# Patient Record
Sex: Male | Born: 1935 | Race: White | Hispanic: No | Smoking: Current some day smoker
Health system: Southern US, Community
[De-identification: ages and names within clinical notes are randomized; demographics above are authoritative.]

## PROBLEM LIST (undated history)

## (undated) DIAGNOSIS — G4733 Obstructive sleep apnea (adult) (pediatric): Secondary | ICD-10-CM

## (undated) DIAGNOSIS — K219 Gastro-esophageal reflux disease without esophagitis: Secondary | ICD-10-CM

## (undated) DIAGNOSIS — I2510 Atherosclerotic heart disease of native coronary artery without angina pectoris: Secondary | ICD-10-CM

## (undated) DIAGNOSIS — Z950 Presence of cardiac pacemaker: Secondary | ICD-10-CM

## (undated) DIAGNOSIS — I4821 Permanent atrial fibrillation: Secondary | ICD-10-CM

## (undated) DIAGNOSIS — E785 Hyperlipidemia, unspecified: Secondary | ICD-10-CM

## (undated) DIAGNOSIS — J449 Chronic obstructive pulmonary disease, unspecified: Secondary | ICD-10-CM

## (undated) DIAGNOSIS — I272 Pulmonary hypertension, unspecified: Secondary | ICD-10-CM

## (undated) DIAGNOSIS — I509 Heart failure, unspecified: Secondary | ICD-10-CM

## (undated) DIAGNOSIS — R079 Chest pain, unspecified: Secondary | ICD-10-CM

## (undated) HISTORY — DX: Obstructive sleep apnea (adult) (pediatric): G47.33

## (undated) HISTORY — PX: PROSTATE SURGERY: SHX751

## (undated) HISTORY — DX: Chest pain, unspecified: R07.9

## (undated) HISTORY — DX: Pulmonary hypertension, unspecified: I27.20

## (undated) HISTORY — PX: HERNIA REPAIR: SHX51

## (undated) HISTORY — DX: Hyperlipidemia, unspecified: E78.5

## (undated) HISTORY — DX: Permanent atrial fibrillation: I48.21

---

## 1997-06-06 ENCOUNTER — Ambulatory Visit (HOSPITAL_COMMUNITY): Admission: RE | Admit: 1997-06-06 | Discharge: 1997-06-06 | Payer: Self-pay | Admitting: Psychiatry

## 1997-06-19 ENCOUNTER — Encounter: Admission: RE | Admit: 1997-06-19 | Discharge: 1997-06-19 | Payer: Self-pay | Admitting: Hematology and Oncology

## 1997-08-14 ENCOUNTER — Encounter: Admission: RE | Admit: 1997-08-14 | Discharge: 1997-08-14 | Payer: Self-pay | Admitting: Family Medicine

## 1997-10-22 ENCOUNTER — Encounter: Admission: RE | Admit: 1997-10-22 | Discharge: 1997-10-22 | Payer: Self-pay | Admitting: Family Medicine

## 1997-11-03 ENCOUNTER — Encounter: Admission: RE | Admit: 1997-11-03 | Discharge: 1997-11-03 | Payer: Self-pay | Admitting: Family Medicine

## 1997-12-01 ENCOUNTER — Encounter: Admission: RE | Admit: 1997-12-01 | Discharge: 1997-12-01 | Payer: Self-pay | Admitting: Family Medicine

## 1998-04-08 ENCOUNTER — Encounter: Admission: RE | Admit: 1998-04-08 | Discharge: 1998-04-08 | Payer: Self-pay | Admitting: Family Medicine

## 1998-04-16 ENCOUNTER — Ambulatory Visit (HOSPITAL_COMMUNITY): Admission: RE | Admit: 1998-04-16 | Discharge: 1998-04-16 | Payer: Self-pay | Admitting: *Deleted

## 1998-04-22 ENCOUNTER — Encounter: Admission: RE | Admit: 1998-04-22 | Discharge: 1998-04-22 | Payer: Self-pay | Admitting: Family Medicine

## 1999-02-05 ENCOUNTER — Encounter: Admission: RE | Admit: 1999-02-05 | Discharge: 1999-02-05 | Payer: Self-pay | Admitting: Family Medicine

## 1999-12-03 ENCOUNTER — Encounter: Admission: RE | Admit: 1999-12-03 | Discharge: 1999-12-03 | Payer: Self-pay | Admitting: Family Medicine

## 2000-01-18 ENCOUNTER — Encounter: Admission: RE | Admit: 2000-01-18 | Discharge: 2000-01-18 | Payer: Self-pay | Admitting: *Deleted

## 2000-01-20 ENCOUNTER — Encounter: Admission: RE | Admit: 2000-01-20 | Discharge: 2000-01-20 | Payer: Self-pay | Admitting: Family Medicine

## 2000-03-23 ENCOUNTER — Encounter: Admission: RE | Admit: 2000-03-23 | Discharge: 2000-03-23 | Payer: Self-pay | Admitting: Family Medicine

## 2000-04-12 ENCOUNTER — Encounter: Admission: RE | Admit: 2000-04-12 | Discharge: 2000-04-12 | Payer: Self-pay | Admitting: Family Medicine

## 2000-05-27 ENCOUNTER — Emergency Department (HOSPITAL_COMMUNITY): Admission: EM | Admit: 2000-05-27 | Discharge: 2000-05-27 | Payer: Self-pay | Admitting: Emergency Medicine

## 2000-06-28 ENCOUNTER — Encounter: Admission: RE | Admit: 2000-06-28 | Discharge: 2000-06-28 | Payer: Self-pay | Admitting: Family Medicine

## 2000-12-13 ENCOUNTER — Encounter: Admission: RE | Admit: 2000-12-13 | Discharge: 2000-12-13 | Payer: Self-pay | Admitting: Family Medicine

## 2001-01-10 ENCOUNTER — Encounter: Admission: RE | Admit: 2001-01-10 | Discharge: 2001-01-10 | Payer: Self-pay | Admitting: Family Medicine

## 2001-03-01 ENCOUNTER — Encounter: Admission: RE | Admit: 2001-03-01 | Discharge: 2001-03-01 | Payer: Self-pay | Admitting: Family Medicine

## 2001-03-06 ENCOUNTER — Encounter: Admission: RE | Admit: 2001-03-06 | Discharge: 2001-03-06 | Payer: Self-pay | Admitting: Family Medicine

## 2001-03-06 ENCOUNTER — Ambulatory Visit (HOSPITAL_COMMUNITY): Admission: RE | Admit: 2001-03-06 | Discharge: 2001-03-06 | Payer: Self-pay | Admitting: Family Medicine

## 2001-03-23 ENCOUNTER — Encounter: Admission: RE | Admit: 2001-03-23 | Discharge: 2001-03-23 | Payer: Self-pay | Admitting: Family Medicine

## 2001-04-06 ENCOUNTER — Encounter: Admission: RE | Admit: 2001-04-06 | Discharge: 2001-04-06 | Payer: Self-pay | Admitting: Family Medicine

## 2001-08-15 ENCOUNTER — Encounter: Admission: RE | Admit: 2001-08-15 | Discharge: 2001-08-15 | Payer: Self-pay | Admitting: Family Medicine

## 2001-08-23 ENCOUNTER — Encounter: Payer: Self-pay | Admitting: Sports Medicine

## 2001-08-23 ENCOUNTER — Encounter: Admission: RE | Admit: 2001-08-23 | Discharge: 2001-08-23 | Payer: Self-pay | Admitting: Sports Medicine

## 2001-08-29 ENCOUNTER — Encounter: Admission: RE | Admit: 2001-08-29 | Discharge: 2001-08-29 | Payer: Self-pay | Admitting: Family Medicine

## 2001-08-31 ENCOUNTER — Encounter: Payer: Self-pay | Admitting: Sports Medicine

## 2001-08-31 ENCOUNTER — Encounter: Admission: RE | Admit: 2001-08-31 | Discharge: 2001-08-31 | Payer: Self-pay | Admitting: Family Medicine

## 2001-08-31 ENCOUNTER — Encounter: Admission: RE | Admit: 2001-08-31 | Discharge: 2001-08-31 | Payer: Self-pay | Admitting: Sports Medicine

## 2001-09-07 ENCOUNTER — Encounter: Admission: RE | Admit: 2001-09-07 | Discharge: 2001-09-07 | Payer: Self-pay | Admitting: Family Medicine

## 2001-09-19 ENCOUNTER — Encounter: Payer: Self-pay | Admitting: Cardiology

## 2001-09-19 ENCOUNTER — Ambulatory Visit (HOSPITAL_COMMUNITY): Admission: RE | Admit: 2001-09-19 | Discharge: 2001-09-19 | Payer: Self-pay | Admitting: *Deleted

## 2001-09-21 ENCOUNTER — Encounter: Admission: RE | Admit: 2001-09-21 | Discharge: 2001-09-21 | Payer: Self-pay | Admitting: Family Medicine

## 2001-09-24 ENCOUNTER — Encounter: Admission: RE | Admit: 2001-09-24 | Discharge: 2001-09-24 | Payer: Self-pay | Admitting: Family Medicine

## 2001-10-03 ENCOUNTER — Encounter: Admission: RE | Admit: 2001-10-03 | Discharge: 2001-10-03 | Payer: Self-pay | Admitting: Family Medicine

## 2001-10-26 ENCOUNTER — Encounter: Admission: RE | Admit: 2001-10-26 | Discharge: 2001-10-26 | Payer: Self-pay | Admitting: Family Medicine

## 2001-11-06 ENCOUNTER — Encounter: Admission: RE | Admit: 2001-11-06 | Discharge: 2001-11-06 | Payer: Self-pay | Admitting: Family Medicine

## 2002-01-23 ENCOUNTER — Encounter: Admission: RE | Admit: 2002-01-23 | Discharge: 2002-01-23 | Payer: Self-pay | Admitting: Family Medicine

## 2002-02-18 ENCOUNTER — Encounter: Admission: RE | Admit: 2002-02-18 | Discharge: 2002-02-18 | Payer: Self-pay | Admitting: Family Medicine

## 2002-04-15 ENCOUNTER — Encounter: Admission: RE | Admit: 2002-04-15 | Discharge: 2002-04-15 | Payer: Self-pay | Admitting: Family Medicine

## 2002-05-08 ENCOUNTER — Encounter: Admission: RE | Admit: 2002-05-08 | Discharge: 2002-05-08 | Payer: Self-pay | Admitting: Family Medicine

## 2002-05-09 ENCOUNTER — Ambulatory Visit (HOSPITAL_COMMUNITY): Admission: RE | Admit: 2002-05-09 | Discharge: 2002-05-09 | Payer: Self-pay | Admitting: Sports Medicine

## 2002-05-15 ENCOUNTER — Encounter: Admission: RE | Admit: 2002-05-15 | Discharge: 2002-05-15 | Payer: Self-pay | Admitting: Family Medicine

## 2002-08-22 ENCOUNTER — Encounter: Admission: RE | Admit: 2002-08-22 | Discharge: 2002-08-22 | Payer: Self-pay | Admitting: Sports Medicine

## 2002-12-09 ENCOUNTER — Ambulatory Visit (HOSPITAL_BASED_OUTPATIENT_CLINIC_OR_DEPARTMENT_OTHER): Admission: RE | Admit: 2002-12-09 | Discharge: 2002-12-09 | Payer: Self-pay | Admitting: Family Medicine

## 2004-11-04 ENCOUNTER — Ambulatory Visit (HOSPITAL_COMMUNITY): Admission: RE | Admit: 2004-11-04 | Discharge: 2004-11-04 | Payer: Self-pay | Admitting: Family Medicine

## 2005-02-20 ENCOUNTER — Emergency Department (HOSPITAL_COMMUNITY): Admission: EM | Admit: 2005-02-20 | Discharge: 2005-02-20 | Payer: Self-pay | Admitting: Emergency Medicine

## 2005-12-08 ENCOUNTER — Ambulatory Visit (HOSPITAL_COMMUNITY): Admission: RE | Admit: 2005-12-08 | Discharge: 2005-12-08 | Payer: Self-pay | Admitting: Cardiovascular Disease

## 2006-04-13 DIAGNOSIS — I10 Essential (primary) hypertension: Secondary | ICD-10-CM | POA: Insufficient documentation

## 2006-04-13 DIAGNOSIS — F17200 Nicotine dependence, unspecified, uncomplicated: Secondary | ICD-10-CM | POA: Insufficient documentation

## 2006-04-13 DIAGNOSIS — M1990 Unspecified osteoarthritis, unspecified site: Secondary | ICD-10-CM

## 2006-04-13 DIAGNOSIS — F411 Generalized anxiety disorder: Secondary | ICD-10-CM | POA: Insufficient documentation

## 2006-04-13 DIAGNOSIS — E669 Obesity, unspecified: Secondary | ICD-10-CM | POA: Insufficient documentation

## 2006-04-13 DIAGNOSIS — J4489 Other specified chronic obstructive pulmonary disease: Secondary | ICD-10-CM | POA: Insufficient documentation

## 2006-04-13 DIAGNOSIS — K279 Peptic ulcer, site unspecified, unspecified as acute or chronic, without hemorrhage or perforation: Secondary | ICD-10-CM | POA: Insufficient documentation

## 2006-04-13 DIAGNOSIS — L219 Seborrheic dermatitis, unspecified: Secondary | ICD-10-CM

## 2006-04-13 DIAGNOSIS — N400 Enlarged prostate without lower urinary tract symptoms: Secondary | ICD-10-CM

## 2006-04-13 DIAGNOSIS — J449 Chronic obstructive pulmonary disease, unspecified: Secondary | ICD-10-CM

## 2006-04-13 DIAGNOSIS — F339 Major depressive disorder, recurrent, unspecified: Secondary | ICD-10-CM

## 2006-04-13 DIAGNOSIS — G43909 Migraine, unspecified, not intractable, without status migrainosus: Secondary | ICD-10-CM | POA: Insufficient documentation

## 2006-10-19 ENCOUNTER — Emergency Department (HOSPITAL_COMMUNITY): Admission: EM | Admit: 2006-10-19 | Discharge: 2006-10-19 | Payer: Self-pay | Admitting: Emergency Medicine

## 2007-12-20 ENCOUNTER — Ambulatory Visit: Payer: Self-pay | Admitting: Surgery

## 2007-12-20 ENCOUNTER — Encounter (INDEPENDENT_AMBULATORY_CARE_PROVIDER_SITE_OTHER): Payer: Self-pay | Admitting: Surgery

## 2007-12-20 ENCOUNTER — Ambulatory Visit (HOSPITAL_COMMUNITY): Admission: RE | Admit: 2007-12-20 | Discharge: 2007-12-20 | Payer: Self-pay | Admitting: Surgery

## 2008-01-06 ENCOUNTER — Encounter: Admission: RE | Admit: 2008-01-06 | Discharge: 2008-01-06 | Payer: Self-pay | Admitting: Surgery

## 2008-04-11 ENCOUNTER — Encounter: Admission: RE | Admit: 2008-04-11 | Discharge: 2008-04-11 | Payer: Self-pay | Admitting: Family Medicine

## 2008-05-26 ENCOUNTER — Ambulatory Visit: Payer: Self-pay | Admitting: Internal Medicine

## 2008-05-26 ENCOUNTER — Encounter (INDEPENDENT_AMBULATORY_CARE_PROVIDER_SITE_OTHER): Payer: Self-pay | Admitting: Cardiology

## 2008-05-26 ENCOUNTER — Ambulatory Visit: Payer: Self-pay | Admitting: Pulmonary Disease

## 2008-05-26 ENCOUNTER — Inpatient Hospital Stay (HOSPITAL_COMMUNITY): Admission: EM | Admit: 2008-05-26 | Discharge: 2008-05-31 | Payer: Self-pay | Admitting: Emergency Medicine

## 2008-05-31 ENCOUNTER — Emergency Department (HOSPITAL_COMMUNITY): Admission: EM | Admit: 2008-05-31 | Discharge: 2008-05-31 | Payer: Self-pay | Admitting: Emergency Medicine

## 2008-06-10 DIAGNOSIS — I2510 Atherosclerotic heart disease of native coronary artery without angina pectoris: Secondary | ICD-10-CM

## 2008-06-10 DIAGNOSIS — I279 Pulmonary heart disease, unspecified: Secondary | ICD-10-CM | POA: Insufficient documentation

## 2008-06-10 DIAGNOSIS — N189 Chronic kidney disease, unspecified: Secondary | ICD-10-CM

## 2008-06-10 DIAGNOSIS — Z87898 Personal history of other specified conditions: Secondary | ICD-10-CM | POA: Insufficient documentation

## 2008-06-10 DIAGNOSIS — E7800 Pure hypercholesterolemia, unspecified: Secondary | ICD-10-CM | POA: Insufficient documentation

## 2008-06-12 ENCOUNTER — Ambulatory Visit: Payer: Self-pay | Admitting: Internal Medicine

## 2008-06-12 DIAGNOSIS — I4891 Unspecified atrial fibrillation: Secondary | ICD-10-CM | POA: Insufficient documentation

## 2008-06-13 ENCOUNTER — Telehealth: Payer: Self-pay | Admitting: Internal Medicine

## 2008-06-22 ENCOUNTER — Emergency Department (HOSPITAL_COMMUNITY): Admission: EM | Admit: 2008-06-22 | Discharge: 2008-06-23 | Payer: Self-pay | Admitting: Emergency Medicine

## 2008-06-25 ENCOUNTER — Ambulatory Visit: Payer: Self-pay | Admitting: Pulmonary Disease

## 2008-06-25 DIAGNOSIS — G4733 Obstructive sleep apnea (adult) (pediatric): Secondary | ICD-10-CM | POA: Insufficient documentation

## 2008-06-25 DIAGNOSIS — I10 Essential (primary) hypertension: Secondary | ICD-10-CM | POA: Insufficient documentation

## 2008-07-08 ENCOUNTER — Ambulatory Visit: Payer: Self-pay | Admitting: Vascular Surgery

## 2008-07-08 ENCOUNTER — Encounter (INDEPENDENT_AMBULATORY_CARE_PROVIDER_SITE_OTHER): Payer: Self-pay | Admitting: Emergency Medicine

## 2008-07-09 ENCOUNTER — Inpatient Hospital Stay (HOSPITAL_COMMUNITY): Admission: EM | Admit: 2008-07-09 | Discharge: 2008-07-14 | Payer: Self-pay | Admitting: Emergency Medicine

## 2008-07-09 ENCOUNTER — Encounter (INDEPENDENT_AMBULATORY_CARE_PROVIDER_SITE_OTHER): Payer: Self-pay | Admitting: Internal Medicine

## 2008-07-09 ENCOUNTER — Encounter (INDEPENDENT_AMBULATORY_CARE_PROVIDER_SITE_OTHER): Payer: Self-pay | Admitting: *Deleted

## 2008-07-29 ENCOUNTER — Ambulatory Visit: Payer: Self-pay | Admitting: Pulmonary Disease

## 2008-08-24 ENCOUNTER — Encounter: Payer: Self-pay | Admitting: Pulmonary Disease

## 2008-11-25 HISTORY — PX: NM MYOCAR PERF WALL MOTION: HXRAD629

## 2009-11-11 ENCOUNTER — Encounter: Payer: Self-pay | Admitting: Pulmonary Disease

## 2009-11-13 ENCOUNTER — Inpatient Hospital Stay (HOSPITAL_COMMUNITY): Admission: AD | Admit: 2009-11-13 | Discharge: 2009-11-23 | Payer: Self-pay

## 2009-11-16 HISTORY — PX: PERMANENT PACEMAKER INSERTION: SHX6023

## 2009-11-18 ENCOUNTER — Encounter (INDEPENDENT_AMBULATORY_CARE_PROVIDER_SITE_OTHER): Payer: Self-pay

## 2009-12-01 ENCOUNTER — Ambulatory Visit (HOSPITAL_COMMUNITY): Admission: RE | Admit: 2009-12-01 | Discharge: 2009-12-01 | Payer: Self-pay | Admitting: Urology

## 2009-12-02 ENCOUNTER — Ambulatory Visit: Payer: Self-pay | Admitting: Critical Care Medicine

## 2009-12-02 ENCOUNTER — Inpatient Hospital Stay (HOSPITAL_COMMUNITY): Admission: AD | Admit: 2009-12-02 | Discharge: 2009-12-08 | Payer: Self-pay | Admitting: Urology

## 2009-12-25 ENCOUNTER — Ambulatory Visit: Payer: Self-pay | Admitting: Pulmonary Disease

## 2009-12-25 LAB — CONVERTED CEMR LAB
CO2: 40 meq/L — ABNORMAL HIGH (ref 19–32)
Calcium: 9.2 mg/dL (ref 8.4–10.5)
Creatinine, Ser: 1 mg/dL (ref 0.4–1.5)
GFR calc non Af Amer: 75.38 mL/min (ref >60)
Glucose, Bld: 104 mg/dL — ABNORMAL HIGH (ref 70–99)
Sodium: 137 meq/L (ref 135–145)

## 2010-01-01 ENCOUNTER — Encounter: Payer: Self-pay | Admitting: Pulmonary Disease

## 2010-03-06 ENCOUNTER — Encounter: Payer: Self-pay | Admitting: Cardiovascular Disease

## 2010-03-07 ENCOUNTER — Encounter: Payer: Self-pay | Admitting: Surgery

## 2010-03-16 NOTE — Assessment & Plan Note (Signed)
Summary: hfu/ mbw   Visit Type:  Hospital Follow-up Copy to:  Dr. Hillis Range Primary Provider/Referring Provider:  Dr. Lesly Rubenstein  CC:  Pt here for hospital follow up.  History of Present Illness: Note that  his echart medical record # is [id number] 74/M , ex-smoker, for FU of severe COPD, CRI, cor pulmonale & obstructive sleep apnea.   adm 4/11 for hyperacrbic resp failure requiring BiPAP.  Tele >> atrial flutter, fibrillation, Dr Johney Frame felt that long term strategy should be AC & rate control with ablation only if symptomatic or decompensation. Echo showed dilated RA, EF 55 %. He developed rt epistaxis requiring cautery & stopped  CPAP . CT chest 04/11/08 >> RLL scarring  6/10 Quit smoking since 4/10. started using CPAP again. He lives by himself & takes his meds. c/o fatigue, HR has been running low, RN called Cards & medication was stopped -? diltiazem- he is not sure which one. Does not want to start pulm rehab. He cannot walk in the store & wants a motorised wheelchair. Spirometry >> mild airway obstruction. poor compliance on download 6/12- 08/24/08 on auto   December 25, 2009 4:38 PM  Traumatic admission s/p prostatectomy with bleeding, urethral trauma requiring suprapubuc cystostomy eventually. He is tearful today. We saw him during hosp admission, diuresed with improvement in breathing. Med rec today  shows -symbicort deleted, he has stopped coumadin due to fear of bleeding. Edema has resolved on lasix 20 two times a day . he has obtained liquid O2 from [employer] [phone].  Current Medications (verified): 1)  Diltiazem Hcl Er Beads 120 Mg Xr24h-Cap (Diltiazem Hcl Er Beads) .... Take One Capsule By Mouth Daily 2)  Crestor 20 Mg Tabs (Rosuvastatin Calcium) .Marland Kitchen.. 1 Once Daily 3)  Omeprazole 20 Mg Cpdr (Omeprazole) .Marland Kitchen.. 1 Once Daily 4)  Vitamin D 54098 Unit Caps (Ergocalciferol) .... Every Tues 5)  Lyrica 75 Mg Caps (Pregabalin) .Marland Kitchen.. 1 Two Times A Day 6)  Alprazolam 1 Mg Tabs  (Alprazolam) .Marland Kitchen.. 1 Three Times A Day 7)  Aspirin Adult Low Strength 81 Mg Tbec (Aspirin) .Marland Kitchen.. 1 Once Daily 8)  Oxycodone-Acetaminophen 10-325 Mg Tabs (Oxycodone-Acetaminophen) .Marland Kitchen.. 1 Every 6 Hours As Needed 9)  Nasonex 50 Mcg/act Susp (Mometasone Furoate) .... 2 Sprays Each Nostril Two Times A Day As Needed 10)  Oxygen 2 Liters .... 24/7 11)  Lidocaine Hcl 0.5 % Soln (Lidocaine Hcl (Local Anesth.)) .... Apply As Needed 12)  Tramadol Hcl 50 Mg Tabs (Tramadol Hcl) .Marland Kitchen.. 1 To 2 Every 6 Hours As Needed 13)  Lidoderm 5 % Ptch (Lidocaine) .... As Needed 14)  Furosemide 20 Mg Tabs (Furosemide) .... Take 1 Tablet By Mouth Two Times A Day 15)  Cpap .... At Bedtime 16)  Nitrostat 0.4 Mg Subl (Nitroglycerin) .... As Needed 17)  Artificial Tears  Soln (Artificial Tear Solution) .... As Needed 18)  Prednisone 10 Mg Tabs (Prednisone) .... Take 1 Tablet By Mouth Once A Day  Allergies (verified): No Known Drug Allergies  Past History:  Past Medical History: Last updated: 06/25/2008 carpel tunnel syndrome, Glossitis, h/o Heavy EtOH use, Refuses screening procedures, rotator cuff strain, trigger finger Left hand, 4th digit Emphysema Hyperlipidemia Hypertension  Social History: Last updated: 06/25/2008 Single, many financial difficulties; Estranged from son, closer with daughter and lives with her.; Smokes heavily, h/o heavy EtOH abuse.  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  Poor insight into need for behavior change, focuses on pain.  Pt states quit  smoking 05-25-08.  Smoked 2 ppd x 53 years.  Past Surgical History: echo 09/19/01: diastolic dysfxn - 09/21/2001 Pacemaker 11/2009 Prostatectomy 11/2009 Hernia repair-11/2009  Review of Systems       The patient complains of dyspnea on exertion.  The patient denies anorexia, fever, weight loss, weight gain, vision loss, decreased hearing, hoarseness, chest pain, syncope, peripheral edema, prolonged cough, headaches, hemoptysis,  abdominal pain, melena, hematochezia, severe indigestion/heartburn, hematuria, muscle weakness, suspicious skin lesions, difficulty walking, depression, unusual weight change, abnormal bleeding, enlarged lymph nodes, and angioedema.    Vital Signs:  Patient profile:   75 year old male Height:      68 inches Weight:      210 pounds BMI:     32.05 O2 Sat:      96 % on 2 L/min pulsed Temp:     97.5 degrees F oral Pulse rate:   62 / minute BP sitting:   98 / 60  (right arm) Cuff size:   large  Vitals Entered By: Zackery Barefoot CMA (December 25, 2009 4:19 PM)  O2 Flow:  2 L/min pulsed CC: Pt here for hospital follow up Comments Medications reviewed with patient Verified contact number and pharmacy with patient Zackery Barefoot CMA  December 25, 2009 4:20 PM    Physical Exam  Additional Exam:  wt 210 December 25, 2009  Gen. Pleasant, well-nourished, in no distress, depressed affect, on O2 ENT - no lesions, no post nasal drip Neck: No JVD, no thyromegaly, no carotid bruits Lungs: no use of accessory muscles, no dullness to percussion, clear without rales or rhonchi  Cardiovascular: Rhythm regular, heart sounds  normal, no murmurs or gallops, no peripheral edema Musculoskeletal: No deformities, no cyanosis or clubbing Neuro:  alert, non focal     Impression & Recommendations:  Problem # 1:  COPD (ICD-496) get back on symbicort 160/4.5 2 puffs two times a day  Albuterol MDI/ nebs as needed only  Problem # 2:  COR PULMONALE (ICD-416.9) decrease lasix to once daily  chk BMET Orders: Est. Patient Level IV (40981) Prescription Created Electronically (X9147) TLB-BMP (Basic Metabolic Panel-BMET) (80048-METABOL) DME Referral (DME)  Problem # 3:  ATRIAL FLUTTER (ICD-427.32) He has stopped coumadin for fear of bleeding. I have asked him to check with Dr Patsi Sears if he can restart. He appears to  be in nSR today . Dr Lynnea Ferrier to opine on whether he needs this lifelong. The  following medications were removed from the medication list:    Coumadin 5 Mg Tabs (Warfarin sodium) .Marland Kitchen... As directed His updated medication list for this problem includes:    Aspirin Adult Low Strength 81 Mg Tbec (Aspirin) .Marland Kitchen... 1 once daily  Problem # 4:  OBSTRUCTIVE SLEEP APNEA (ICD-780.57) Compliance encouraged, wt loss emphasized, asked to avoid meds with sedative side effects, cautioned against driving when sleepy.   Medications Added to Medication List This Visit: 1)  Furosemide 20 Mg Tabs (Furosemide) .... Take 1 tablet by mouth two times a day 2)  Nitrostat 0.4 Mg Subl (Nitroglycerin) .... As needed 3)  Artificial Tears Soln (Artificial tear solution) .... As needed 4)  Prednisone 10 Mg Tabs (Prednisone) .... Take 1 tablet by mouth once a day  Patient Instructions: 1)  Copy sent to: Dr Linward Headland, [place] 2)  Please schedule a follow-up appointment in 3-4 months. 3)  get back on symbicort 160/4.5 2 puffs two times a day  4)  Albuterol MDI/ nebs as needed only 5)  blood work today  6)  Meet our [facility] before you leave 7)  Get back on coumadin if OK with Dr Patsi Sears, your pulse is regular today 8)  decrease lasix to 20 mg once daily , if wt increases by 5 lbs or if swelling develops, you may have to increase again   Immunization History:  Influenza Immunization History:    Influenza:  historical (11/16/2009)  Pneumovax Immunization History:    Pneumovax:  historical (12/17/2007)   Appended Document: hfu/ mbw taper prednisone to 1/2 tab once daily x 7 days then off Potassium level ok, ok to take once daily   Appended Document: hfu/ mbw pt informed and verbalized understanding that labs. jwr

## 2010-03-18 NOTE — Miscellaneous (Signed)
Summary: POA  POA   Imported By: Sherian Rein 01/29/2010 11:34:52  _____________________________________________________________________  External Attachment:    Type:   Image     Comment:   External Document

## 2010-04-28 LAB — BASIC METABOLIC PANEL
BUN: 17 mg/dL (ref 6–23)
BUN: 18 mg/dL (ref 6–23)
CO2: 31 mEq/L (ref 19–32)
CO2: 33 mEq/L — ABNORMAL HIGH (ref 19–32)
CO2: 34 mEq/L — ABNORMAL HIGH (ref 19–32)
Chloride: 101 mEq/L (ref 96–112)
Chloride: 95 mEq/L — ABNORMAL LOW (ref 96–112)
Chloride: 99 mEq/L (ref 96–112)
Creatinine, Ser: 0.8 mg/dL (ref 0.4–1.5)
Creatinine, Ser: 1.15 mg/dL (ref 0.4–1.5)
GFR calc Af Amer: >60 mL/min (ref >60)
GFR calc Af Amer: >60 mL/min (ref >60)
Glucose, Bld: 138 mg/dL — ABNORMAL HIGH (ref 70–99)
Potassium: 5 mEq/L (ref 3.5–5.1)
Sodium: 134 mEq/L — ABNORMAL LOW (ref 135–145)

## 2010-04-28 LAB — CBC
HCT: 28.6 % — ABNORMAL LOW (ref 39.0–52.0)
HCT: 30.8 % — ABNORMAL LOW (ref 39.0–52.0)
Hemoglobin: 10 g/dL — ABNORMAL LOW (ref 13.0–17.0)
Hemoglobin: 11.7 g/dL — ABNORMAL LOW (ref 13.0–17.0)
MCH: 30.1 pg (ref 26.0–34.0)
MCH: 30.1 pg (ref 26.0–34.0)
MCH: 30.1 pg (ref 26.0–34.0)
MCH: 30.1 pg (ref 26.0–34.0)
MCHC: 33.6 g/dL (ref 30.0–36.0)
MCHC: 34.3 g/dL (ref 30.0–36.0)
MCV: 88.2 fL (ref 78.0–100.0)
MCV: 89.1 fL (ref 78.0–100.0)
MCV: 89.3 fL (ref 78.0–100.0)
MCV: 89.5 fL (ref 78.0–100.0)
Platelets: 264 10*3/uL (ref 150–400)
Platelets: 300 10*3/uL (ref 150–400)
Platelets: 334 10*3/uL (ref 150–400)
Platelets: 360 10*3/uL (ref 150–400)
RBC: 3.17 MIL/uL — ABNORMAL LOW (ref 4.22–5.81)
RBC: 3.25 MIL/uL — ABNORMAL LOW (ref 4.22–5.81)
RBC: 3.31 MIL/uL — ABNORMAL LOW (ref 4.22–5.81)
RDW: 15.7 % — ABNORMAL HIGH (ref 11.5–15.5)
WBC: 12.2 10*3/uL — ABNORMAL HIGH (ref 4.0–10.5)

## 2010-04-28 LAB — COMPREHENSIVE METABOLIC PANEL
AST: 14 U/L (ref 0–37)
Albumin: 2.1 g/dL — ABNORMAL LOW (ref 3.5–5.2)
Alkaline Phosphatase: 55 U/L (ref 39–117)
Chloride: 97 mEq/L (ref 96–112)
GFR calc Af Amer: >60 mL/min (ref >60)
Potassium: 4 mEq/L (ref 3.5–5.1)
Total Bilirubin: 1.3 mg/dL — ABNORMAL HIGH (ref 0.3–1.2)

## 2010-04-28 LAB — PROTIME-INR
INR: 1.76 — ABNORMAL HIGH (ref 0.00–1.49)
Prothrombin Time: 20.7 seconds — ABNORMAL HIGH (ref 11.6–15.2)

## 2010-04-28 LAB — URINE CULTURE: Special Requests: NEGATIVE

## 2010-04-28 LAB — EXPECTORATED SPUTUM ASSESSMENT W GRAM STAIN, RFLX TO RESP C

## 2010-04-28 LAB — CARDIAC PANEL(CRET KIN+CKTOT+MB+TROPI): Relative Index: INVALID (ref 0.0–2.5)

## 2010-04-29 LAB — CBC
HCT: 34.5 % — ABNORMAL LOW (ref 39.0–52.0)
HCT: 45.1 % (ref 39.0–52.0)
HCT: 45.3 % (ref 39.0–52.0)
Hemoglobin: 14 g/dL (ref 13.0–17.0)
Hemoglobin: 14.8 g/dL (ref 13.0–17.0)
Hemoglobin: 15 g/dL (ref 13.0–17.0)
MCH: 29.4 pg (ref 26.0–34.0)
MCH: 29.8 pg (ref 26.0–34.0)
MCH: 30.1 pg (ref 26.0–34.0)
MCHC: 32.8 g/dL (ref 30.0–36.0)
MCHC: 33.1 g/dL (ref 30.0–36.0)
MCV: 90.4 fL (ref 78.0–100.0)
MCV: 90.7 fL (ref 78.0–100.0)
MCV: 90.8 fL (ref 78.0–100.0)
MCV: 91.4 fL (ref 78.0–100.0)
Platelets: 129 10*3/uL — ABNORMAL LOW (ref 150–400)
Platelets: 132 10*3/uL — ABNORMAL LOW (ref 150–400)
Platelets: 134 10*3/uL — ABNORMAL LOW (ref 150–400)
Platelets: 136 10*3/uL — ABNORMAL LOW (ref 150–400)
Platelets: 139 10*3/uL — ABNORMAL LOW (ref 150–400)
Platelets: 140 10*3/uL — ABNORMAL LOW (ref 150–400)
RBC: 3.85 MIL/uL — ABNORMAL LOW (ref 4.22–5.81)
RBC: 4.65 MIL/uL (ref 4.22–5.81)
RBC: 5.01 MIL/uL (ref 4.22–5.81)
RDW: 15.8 % — ABNORMAL HIGH (ref 11.5–15.5)
RDW: 15.9 % — ABNORMAL HIGH (ref 11.5–15.5)
RDW: 16 % — ABNORMAL HIGH (ref 11.5–15.5)
RDW: 16.4 % — ABNORMAL HIGH (ref 11.5–15.5)
RDW: 16.5 % — ABNORMAL HIGH (ref 11.5–15.5)
WBC: 6.7 10*3/uL (ref 4.0–10.5)
WBC: 6.9 10*3/uL (ref 4.0–10.5)
WBC: 7.7 10*3/uL (ref 4.0–10.5)
WBC: 8.3 10*3/uL (ref 4.0–10.5)
WBC: 8.8 10*3/uL (ref 4.0–10.5)
WBC: 9.7 10*3/uL (ref 4.0–10.5)

## 2010-04-29 LAB — COMPREHENSIVE METABOLIC PANEL
ALT: 12 U/L (ref 0–53)
AST: 22 U/L (ref 0–37)
Albumin: 3.6 g/dL (ref 3.5–5.2)
Alkaline Phosphatase: 45 U/L (ref 39–117)
Chloride: 96 mEq/L (ref 96–112)
GFR calc Af Amer: >60 mL/min (ref >60)
Potassium: 3.9 mEq/L (ref 3.5–5.1)
Sodium: 138 mEq/L (ref 135–145)
Total Bilirubin: 1 mg/dL (ref 0.3–1.2)

## 2010-04-29 LAB — HEMOGLOBIN AND HEMATOCRIT, BLOOD
HCT: 36.6 % — ABNORMAL LOW (ref 39.0–52.0)
HCT: 41.7 % (ref 39.0–52.0)
Hemoglobin: 13.4 g/dL (ref 13.0–17.0)

## 2010-04-29 LAB — BASIC METABOLIC PANEL
BUN: 13 mg/dL (ref 6–23)
BUN: 17 mg/dL (ref 6–23)
BUN: 21 mg/dL (ref 6–23)
BUN: 7 mg/dL (ref 6–23)
BUN: 8 mg/dL (ref 6–23)
BUN: 8 mg/dL (ref 6–23)
CO2: 33 mEq/L — ABNORMAL HIGH (ref 19–32)
CO2: 33 mEq/L — ABNORMAL HIGH (ref 19–32)
CO2: 34 mEq/L — ABNORMAL HIGH (ref 19–32)
Calcium: 8.1 mg/dL — ABNORMAL LOW (ref 8.4–10.5)
Calcium: 8.9 mg/dL (ref 8.4–10.5)
Chloride: 100 mEq/L (ref 96–112)
Chloride: 98 mEq/L (ref 96–112)
Chloride: 99 mEq/L (ref 96–112)
Chloride: 99 mEq/L (ref 96–112)
Creatinine, Ser: 0.9 mg/dL (ref 0.4–1.5)
Creatinine, Ser: 0.97 mg/dL (ref 0.4–1.5)
Creatinine, Ser: 0.99 mg/dL (ref 0.4–1.5)
Creatinine, Ser: 1.18 mg/dL (ref 0.4–1.5)
GFR calc Af Amer: >60 mL/min (ref >60)
GFR calc Af Amer: >60 mL/min (ref >60)
GFR calc non Af Amer: >60 mL/min (ref >60)
GFR calc non Af Amer: >60 mL/min (ref >60)
Glucose, Bld: 114 mg/dL — ABNORMAL HIGH (ref 70–99)
Glucose, Bld: 119 mg/dL — ABNORMAL HIGH (ref 70–99)
Glucose, Bld: 122 mg/dL — ABNORMAL HIGH (ref 70–99)
Potassium: 3.9 mEq/L (ref 3.5–5.1)
Sodium: 140 mEq/L (ref 135–145)
Sodium: 141 mEq/L (ref 135–145)

## 2010-04-29 LAB — PROTIME-INR
INR: 0.99 (ref 0.00–1.49)
INR: 1.14 (ref 0.00–1.49)
INR: 1.7 — ABNORMAL HIGH (ref 0.00–1.49)
Prothrombin Time: 14.8 seconds (ref 11.6–15.2)
Prothrombin Time: 17.3 seconds — ABNORMAL HIGH (ref 11.6–15.2)

## 2010-04-29 LAB — HEPARIN LEVEL (UNFRACTIONATED)
Heparin Unfractionated: 0.45 IU/mL (ref 0.30–0.70)
Heparin Unfractionated: 0.45 IU/mL (ref 0.30–0.70)

## 2010-04-29 LAB — DIFFERENTIAL
Basophils Absolute: 0 10*3/uL (ref 0.0–0.1)
Basophils Relative: 0 % (ref 0–1)
Eosinophils Relative: 1 % (ref 0–5)
Lymphocytes Relative: 23 % (ref 12–46)
Monocytes Absolute: 0.4 10*3/uL (ref 0.1–1.0)

## 2010-04-29 LAB — BRAIN NATRIURETIC PEPTIDE: Pro B Natriuretic peptide (BNP): 148 pg/mL — ABNORMAL HIGH (ref 0.0–100.0)

## 2010-04-29 LAB — MAGNESIUM: Magnesium: 1.9 mg/dL (ref 1.5–2.5)

## 2010-04-29 LAB — APTT: aPTT: 81 seconds — ABNORMAL HIGH (ref 24–37)

## 2010-05-25 LAB — BASIC METABOLIC PANEL
BUN: 11 mg/dL (ref 6–23)
BUN: 15 mg/dL (ref 6–23)
BUN: 34 mg/dL — ABNORMAL HIGH (ref 6–23)
BUN: 35 mg/dL — ABNORMAL HIGH (ref 6–23)
BUN: 13 mg/dL (ref 6–23)
CO2: 32 mEq/L (ref 19–32)
CO2: 37 mEq/L — ABNORMAL HIGH (ref 19–32)
Calcium: 8 mg/dL — ABNORMAL LOW (ref 8.4–10.5)
Calcium: 8.8 mg/dL (ref 8.4–10.5)
Chloride: 102 mEq/L (ref 96–112)
Chloride: 87 mEq/L — ABNORMAL LOW (ref 96–112)
Chloride: 92 mEq/L — ABNORMAL LOW (ref 96–112)
Chloride: 92 mEq/L — ABNORMAL LOW (ref 96–112)
Chloride: 93 mEq/L — ABNORMAL LOW (ref 96–112)
Chloride: 93 mEq/L — ABNORMAL LOW (ref 96–112)
Chloride: 95 mEq/L — ABNORMAL LOW (ref 96–112)
Creatinine, Ser: 1.03 mg/dL (ref 0.4–1.5)
Creatinine, Ser: 1.12 mg/dL (ref 0.4–1.5)
GFR calc Af Amer: 33 mL/min — ABNORMAL LOW (ref >60)
GFR calc Af Amer: >60 mL/min (ref >60)
GFR calc Af Amer: >60 mL/min (ref >60)
GFR calc non Af Amer: >60 mL/min (ref >60)
GFR calc non Af Amer: >60 mL/min (ref >60)
GFR calc non Af Amer: >60 mL/min (ref >60)
Glucose, Bld: 114 mg/dL — ABNORMAL HIGH (ref 70–99)
Glucose, Bld: 115 mg/dL — ABNORMAL HIGH (ref 70–99)
Glucose, Bld: 123 mg/dL — ABNORMAL HIGH (ref 70–99)
Potassium: 4.3 mEq/L (ref 3.5–5.1)
Potassium: 4.9 mEq/L (ref 3.5–5.1)
Potassium: 5.1 mEq/L (ref 3.5–5.1)
Potassium: 5.5 mEq/L — ABNORMAL HIGH (ref 3.5–5.1)
Potassium: 3.4 mEq/L — ABNORMAL LOW (ref 3.5–5.1)
Sodium: 131 mEq/L — ABNORMAL LOW (ref 135–145)
Sodium: 134 mEq/L — ABNORMAL LOW (ref 135–145)
Sodium: 139 mEq/L (ref 135–145)
Sodium: 140 mEq/L (ref 135–145)

## 2010-05-25 LAB — COMPREHENSIVE METABOLIC PANEL
Alkaline Phosphatase: 54 U/L (ref 39–117)
BUN: 10 mg/dL (ref 6–23)
CO2: 40 mEq/L — ABNORMAL HIGH (ref 19–32)
Chloride: 92 mEq/L — ABNORMAL LOW (ref 96–112)
GFR calc non Af Amer: 53 mL/min — ABNORMAL LOW (ref >60)
Glucose, Bld: 102 mg/dL — ABNORMAL HIGH (ref 70–99)
Potassium: 3.6 mEq/L (ref 3.5–5.1)
Total Bilirubin: 0.8 mg/dL (ref 0.3–1.2)

## 2010-05-25 LAB — BLOOD GAS, ARTERIAL
Acid-Base Excess: 0.9 mmol/L (ref 0.0–2.0)
Acid-Base Excess: 3.9 mmol/L — ABNORMAL HIGH (ref 0.0–2.0)
Bicarbonate: 30.5 mEq/L — ABNORMAL HIGH (ref 20.0–24.0)
Bicarbonate: 30.7 mEq/L — ABNORMAL HIGH (ref 20.0–24.0)
Bicarbonate: 31.6 mEq/L — ABNORMAL HIGH (ref 20.0–24.0)
Delivery systems: POSITIVE
Delivery systems: POSITIVE
Expiratory PAP: 8
Expiratory PAP: 9
FIO2: 0.35 %
FIO2: 0.4 %
FIO2: 0.4 %
Inspiratory PAP: 14
O2 Saturation: 86.5 %
O2 Saturation: 94.9 %
Patient temperature: 98.6
Patient temperature: 98.6
Patient temperature: 98.6
RATE: 8 resp/min
TCO2: 32.6 mmol/L (ref 0–100)
pCO2 arterial: 75.9 mmHg (ref 35.0–45.0)
pCO2 arterial: 85.8 mmHg (ref 35.0–45.0)
pH, Arterial: 7.195 — CL (ref 7.350–7.450)
pH, Arterial: 7.263 — ABNORMAL LOW (ref 7.350–7.450)
pH, Arterial: 7.278 — ABNORMAL LOW (ref 7.350–7.450)
pO2, Arterial: 54.8 mmHg — ABNORMAL LOW (ref 80.0–100.0)
pO2, Arterial: 56.4 mmHg — ABNORMAL LOW (ref 80.0–100.0)

## 2010-05-25 LAB — POCT I-STAT, CHEM 8
BUN: 40 mg/dL — ABNORMAL HIGH (ref 6–23)
Chloride: 88 mEq/L — ABNORMAL LOW (ref 96–112)
Potassium: 5.4 mEq/L — ABNORMAL HIGH (ref 3.5–5.1)
Sodium: 127 mEq/L — ABNORMAL LOW (ref 135–145)

## 2010-05-25 LAB — CBC
HCT: 33.7 % — ABNORMAL LOW (ref 39.0–52.0)
HCT: 33.7 % — ABNORMAL LOW (ref 39.0–52.0)
HCT: 35.8 % — ABNORMAL LOW (ref 39.0–52.0)
HCT: 36.5 % — ABNORMAL LOW (ref 39.0–52.0)
Hemoglobin: 11.1 g/dL — ABNORMAL LOW (ref 13.0–17.0)
Hemoglobin: 11.3 g/dL — ABNORMAL LOW (ref 13.0–17.0)
MCV: 86.1 fL (ref 78.0–100.0)
MCV: 86.3 fL (ref 78.0–100.0)
MCV: 87.1 fL (ref 78.0–100.0)
MCV: 86.8 fL (ref 78.0–100.0)
Platelets: 229 10*3/uL (ref 150–400)
Platelets: 254 10*3/uL (ref 150–400)
Platelets: 290 10*3/uL (ref 150–400)
Platelets: 231 10*3/uL (ref 150–400)
RBC: 3.9 MIL/uL — ABNORMAL LOW (ref 4.22–5.81)
RDW: 14.9 % (ref 11.5–15.5)
WBC: 7.2 10*3/uL (ref 4.0–10.5)
WBC: 7.2 10*3/uL (ref 4.0–10.5)
WBC: 7.6 10*3/uL (ref 4.0–10.5)
WBC: 9.2 10*3/uL (ref 4.0–10.5)

## 2010-05-25 LAB — DIFFERENTIAL
Basophils Absolute: 0.1 10*3/uL (ref 0.0–0.1)
Eosinophils Absolute: 0.1 10*3/uL (ref 0.0–0.7)
Eosinophils Absolute: 0.2 10*3/uL (ref 0.0–0.7)
Eosinophils Relative: 1 % (ref 0–5)
Eosinophils Relative: 3 % (ref 0–5)
Lymphocytes Relative: 22 % (ref 12–46)
Lymphs Abs: 1.9 10*3/uL (ref 0.7–4.0)
Monocytes Absolute: 0.6 10*3/uL (ref 0.1–1.0)
Monocytes Relative: 7 % (ref 3–12)

## 2010-05-25 LAB — POCT CARDIAC MARKERS
CKMB, poc: 1.7 ng/mL (ref 1.0–8.0)
Myoglobin, poc: 335 ng/mL (ref 12–200)

## 2010-05-25 LAB — CARDIAC PANEL(CRET KIN+CKTOT+MB+TROPI)
CK, MB: 4 ng/mL (ref 0.3–4.0)
CK, MB: 4.3 ng/mL — ABNORMAL HIGH (ref 0.3–4.0)
Relative Index: 2.3 (ref 0.0–2.5)
Relative Index: 2.6 — ABNORMAL HIGH (ref 0.0–2.5)
Total CK: 172 U/L (ref 7–232)
Troponin I: 0.03 ng/mL (ref 0.00–0.06)
Troponin I: 0.03 ng/mL (ref 0.00–0.06)

## 2010-05-25 LAB — D-DIMER, QUANTITATIVE: D-Dimer, Quant: 1.14 ug/mL-FEU — ABNORMAL HIGH (ref 0.00–0.48)

## 2010-05-25 LAB — BRAIN NATRIURETIC PEPTIDE
Pro B Natriuretic peptide (BNP): 63 pg/mL (ref 0.0–100.0)
Pro B Natriuretic peptide (BNP): 71 pg/mL (ref 0.0–100.0)

## 2010-05-25 LAB — URINALYSIS, ROUTINE W REFLEX MICROSCOPIC
Bilirubin Urine: NEGATIVE
Hgb urine dipstick: NEGATIVE
Hgb urine dipstick: NEGATIVE
Protein, ur: NEGATIVE mg/dL
Protein, ur: NEGATIVE mg/dL
Urobilinogen, UA: 1 mg/dL (ref 0.0–1.0)
Urobilinogen, UA: 1 mg/dL (ref 0.0–1.0)

## 2010-05-25 LAB — PROTIME-INR
INR: 2.2 — ABNORMAL HIGH (ref 0.00–1.49)
INR: 2.5 — ABNORMAL HIGH (ref 0.00–1.49)
Prothrombin Time: 26 seconds — ABNORMAL HIGH (ref 11.6–15.2)
Prothrombin Time: 26.5 seconds — ABNORMAL HIGH (ref 11.6–15.2)
Prothrombin Time: 24.3 seconds — ABNORMAL HIGH (ref 11.6–15.2)

## 2010-05-25 LAB — DIGOXIN LEVEL: Digoxin Level: 0.8 ng/mL (ref 0.8–2.0)

## 2010-05-25 LAB — IRON AND TIBC
Iron: 52 ug/dL (ref 42–135)
Saturation Ratios: 16 % — ABNORMAL LOW (ref 20–55)
TIBC: 318 ug/dL (ref 215–435)
UIBC: 266 ug/dL

## 2010-05-26 LAB — CBC
HCT: 46.9 % (ref 39.0–52.0)
HCT: 42 % (ref 39.0–52.0)
HCT: 42.7 % (ref 39.0–52.0)
HCT: 44.3 % (ref 39.0–52.0)
HCT: 45.6 % (ref 39.0–52.0)
Hemoglobin: 13.9 g/dL (ref 13.0–17.0)
Hemoglobin: 14.1 g/dL (ref 13.0–17.0)
Hemoglobin: 14.3 g/dL (ref 13.0–17.0)
Hemoglobin: 14.4 g/dL (ref 13.0–17.0)
MCHC: 33.1 g/dL (ref 30.0–36.0)
MCHC: 33.3 g/dL (ref 30.0–36.0)
MCHC: 32.9 g/dL (ref 30.0–36.0)
MCHC: 33.5 g/dL (ref 30.0–36.0)
MCHC: 33.8 g/dL (ref 30.0–36.0)
MCV: 90 fL (ref 78.0–100.0)
MCV: 90.1 fL (ref 78.0–100.0)
MCV: 90.9 fL (ref 78.0–100.0)
MCV: 89 fL (ref 78.0–100.0)
MCV: 89.4 fL (ref 78.0–100.0)
MCV: 90.2 fL (ref 78.0–100.0)
MCV: 90.7 fL (ref 78.0–100.0)
Platelets: 192 10*3/uL (ref 150–400)
Platelets: 194 10*3/uL (ref 150–400)
Platelets: 219 10*3/uL (ref 150–400)
Platelets: 222 10*3/uL (ref 150–400)
Platelets: 231 10*3/uL (ref 150–400)
Platelets: 243 10*3/uL (ref 150–400)
RBC: 5.11 MIL/uL (ref 4.22–5.81)
RBC: 4.6 MIL/uL (ref 4.22–5.81)
RBC: 4.74 MIL/uL (ref 4.22–5.81)
RBC: 5.03 MIL/uL (ref 4.22–5.81)
RDW: 14.4 % (ref 11.5–15.5)
RDW: 14.5 % (ref 11.5–15.5)
RDW: 14.5 % (ref 11.5–15.5)
RDW: 14.6 % (ref 11.5–15.5)
WBC: 13.9 10*3/uL — ABNORMAL HIGH (ref 4.0–10.5)
WBC: 16 10*3/uL — ABNORMAL HIGH (ref 4.0–10.5)
WBC: 16.2 10*3/uL — ABNORMAL HIGH (ref 4.0–10.5)
WBC: 18.2 10*3/uL — ABNORMAL HIGH (ref 4.0–10.5)

## 2010-05-26 LAB — COMPREHENSIVE METABOLIC PANEL
ALT: 14 U/L (ref 0–53)
AST: 16 U/L (ref 0–37)
CO2: 31 mEq/L (ref 19–32)
Chloride: 91 mEq/L — ABNORMAL LOW (ref 96–112)
GFR calc Af Amer: 43 mL/min — ABNORMAL LOW (ref >60)
GFR calc non Af Amer: 36 mL/min — ABNORMAL LOW (ref >60)
Glucose, Bld: 134 mg/dL — ABNORMAL HIGH (ref 70–99)
Sodium: 132 mEq/L — ABNORMAL LOW (ref 135–145)
Total Bilirubin: 0.9 mg/dL (ref 0.3–1.2)

## 2010-05-26 LAB — BASIC METABOLIC PANEL
BUN: 34 mg/dL — ABNORMAL HIGH (ref 6–23)
BUN: 54 mg/dL — ABNORMAL HIGH (ref 6–23)
BUN: 56 mg/dL — ABNORMAL HIGH (ref 6–23)
BUN: 59 mg/dL — ABNORMAL HIGH (ref 6–23)
CO2: 32 mEq/L (ref 19–32)
CO2: 41 mEq/L — ABNORMAL HIGH (ref 19–32)
Calcium: 9.2 mg/dL (ref 8.4–10.5)
Calcium: 9.2 mg/dL (ref 8.4–10.5)
Chloride: 89 mEq/L — ABNORMAL LOW (ref 96–112)
Chloride: 90 mEq/L — ABNORMAL LOW (ref 96–112)
Chloride: 91 mEq/L — ABNORMAL LOW (ref 96–112)
Chloride: 93 mEq/L — ABNORMAL LOW (ref 96–112)
GFR calc Af Amer: 42 mL/min — ABNORMAL LOW (ref >60)
GFR calc Af Amer: 45 mL/min — ABNORMAL LOW (ref >60)
GFR calc non Af Amer: 35 mL/min — ABNORMAL LOW (ref >60)
GFR calc non Af Amer: 52 mL/min — ABNORMAL LOW (ref >60)
Glucose, Bld: 133 mg/dL — ABNORMAL HIGH (ref 70–99)
Glucose, Bld: 134 mg/dL — ABNORMAL HIGH (ref 70–99)
Glucose, Bld: 157 mg/dL — ABNORMAL HIGH (ref 70–99)
Potassium: 4.3 mEq/L (ref 3.5–5.1)
Potassium: 5.1 mEq/L (ref 3.5–5.1)
Potassium: 5.3 mEq/L — ABNORMAL HIGH (ref 3.5–5.1)
Potassium: 5.4 mEq/L — ABNORMAL HIGH (ref 3.5–5.1)
Potassium: 5.5 mEq/L — ABNORMAL HIGH (ref 3.5–5.1)
Sodium: 130 mEq/L — ABNORMAL LOW (ref 135–145)
Sodium: 133 mEq/L — ABNORMAL LOW (ref 135–145)
Sodium: 135 mEq/L (ref 135–145)
Sodium: 143 mEq/L (ref 135–145)

## 2010-05-26 LAB — URINE MICROSCOPIC-ADD ON

## 2010-05-26 LAB — GLUCOSE, CAPILLARY
Glucose-Capillary: 124 mg/dL — ABNORMAL HIGH (ref 70–99)
Glucose-Capillary: 149 mg/dL — ABNORMAL HIGH (ref 70–99)
Glucose-Capillary: 151 mg/dL — ABNORMAL HIGH (ref 70–99)
Glucose-Capillary: 169 mg/dL — ABNORMAL HIGH (ref 70–99)
Glucose-Capillary: 178 mg/dL — ABNORMAL HIGH (ref 70–99)
Glucose-Capillary: 178 mg/dL — ABNORMAL HIGH (ref 70–99)
Glucose-Capillary: 185 mg/dL — ABNORMAL HIGH (ref 70–99)
Glucose-Capillary: 211 mg/dL — ABNORMAL HIGH (ref 70–99)

## 2010-05-26 LAB — POCT I-STAT, CHEM 8
Creatinine, Ser: 2 mg/dL — ABNORMAL HIGH (ref 0.4–1.5)
HCT: 47 % (ref 39.0–52.0)
Hemoglobin: 16 g/dL (ref 13.0–17.0)
Potassium: 5.1 mEq/L (ref 3.5–5.1)
Sodium: 133 mEq/L — ABNORMAL LOW (ref 135–145)
TCO2: 38 mmol/L (ref 0–100)

## 2010-05-26 LAB — CK TOTAL AND CKMB (NOT AT ARMC)
CK, MB: 4.8 ng/mL — ABNORMAL HIGH (ref 0.3–4.0)
Relative Index: 3.4 — ABNORMAL HIGH (ref 0.0–2.5)
Relative Index: INVALID (ref 0.0–2.5)
Total CK: 141 U/L (ref 7–232)
Total CK: 51 U/L (ref 7–232)

## 2010-05-26 LAB — HEPARIN LEVEL (UNFRACTIONATED)
Heparin Unfractionated: 0.39 IU/mL (ref 0.30–0.70)
Heparin Unfractionated: 1.49 IU/mL — ABNORMAL HIGH (ref 0.30–0.70)

## 2010-05-26 LAB — BLOOD GAS, ARTERIAL
Acid-Base Excess: 6.7 mmol/L — ABNORMAL HIGH (ref 0.0–2.0)
Bicarbonate: 33.5 mEq/L — ABNORMAL HIGH (ref 20.0–24.0)
Delivery systems: POSITIVE
TCO2: 35.8 mmol/L (ref 0–100)
pCO2 arterial: 75.7 mmHg (ref 35.0–45.0)
pH, Arterial: 7.265 — ABNORMAL LOW (ref 7.350–7.450)

## 2010-05-26 LAB — POCT I-STAT 3, ART BLOOD GAS (G3+)
Acid-Base Excess: 5 mmol/L — ABNORMAL HIGH (ref 0.0–2.0)
Acid-Base Excess: 6 mmol/L — ABNORMAL HIGH (ref 0.0–2.0)
Acid-Base Excess: 6 mmol/L — ABNORMAL HIGH (ref 0.0–2.0)
Bicarbonate: 40.1 mEq/L — ABNORMAL HIGH (ref 20.0–24.0)
O2 Saturation: 91 %
O2 Saturation: 94 %
TCO2: 40 mmol/L (ref 0–100)
pCO2 arterial: 84.1 mmHg (ref 35.0–45.0)
pCO2 arterial: 90.2 mmHg (ref 35.0–45.0)
pH, Arterial: 7.171 — CL (ref 7.350–7.450)
pH, Arterial: 7.243 — ABNORMAL LOW (ref 7.350–7.450)
pO2, Arterial: 273 mmHg — ABNORMAL HIGH (ref 80.0–100.0)
pO2, Arterial: 88 mmHg (ref 80.0–100.0)

## 2010-05-26 LAB — URINALYSIS, ROUTINE W REFLEX MICROSCOPIC
Glucose, UA: NEGATIVE mg/dL
Leukocytes, UA: NEGATIVE
Protein, ur: 100 mg/dL — AB
pH: 5 (ref 5.0–8.0)

## 2010-05-26 LAB — CARDIAC PANEL(CRET KIN+CKTOT+MB+TROPI)
CK, MB: 5.4 ng/mL — ABNORMAL HIGH (ref 0.3–4.0)
Relative Index: 2 (ref 0.0–2.5)
Total CK: 268 U/L — ABNORMAL HIGH (ref 7–232)
Troponin I: 0.02 ng/mL (ref 0.00–0.06)

## 2010-05-26 LAB — MAGNESIUM
Magnesium: 1.9 mg/dL (ref 1.5–2.5)
Magnesium: 1.9 mg/dL (ref 1.5–2.5)

## 2010-05-26 LAB — CULTURE, BLOOD (ROUTINE X 2): Culture: NO GROWTH

## 2010-05-26 LAB — DIFFERENTIAL
Basophils Absolute: 0 10*3/uL (ref 0.0–0.1)
Basophils Absolute: 0 10*3/uL (ref 0.0–0.1)
Basophils Relative: 0 % (ref 0–1)
Basophils Relative: 0 % (ref 0–1)
Eosinophils Absolute: 0 10*3/uL (ref 0.0–0.7)
Eosinophils Absolute: 0 10*3/uL (ref 0.0–0.7)
Eosinophils Absolute: 0 10*3/uL (ref 0.0–0.7)
Eosinophils Relative: 0 % (ref 0–5)
Eosinophils Relative: 0 % (ref 0–5)
Eosinophils Relative: 0 % (ref 0–5)
Lymphs Abs: 1.5 10*3/uL (ref 0.7–4.0)
Monocytes Absolute: 1.3 10*3/uL — ABNORMAL HIGH (ref 0.1–1.0)
Monocytes Relative: 1 % — ABNORMAL LOW (ref 3–12)
Neutrophils Relative %: 90 % — ABNORMAL HIGH (ref 43–77)
Neutrophils Relative %: 96 % — ABNORMAL HIGH (ref 43–77)

## 2010-05-26 LAB — URINE CULTURE: Colony Count: NO GROWTH

## 2010-05-26 LAB — PROTIME-INR
INR: 1.9 — ABNORMAL HIGH (ref 0.00–1.49)
INR: 1.1 (ref 0.00–1.49)
INR: 1.2 (ref 0.00–1.49)
Prothrombin Time: 14.8 seconds (ref 11.6–15.2)
Prothrombin Time: 15.3 seconds — ABNORMAL HIGH (ref 11.6–15.2)
Prothrombin Time: 16.2 seconds — ABNORMAL HIGH (ref 11.6–15.2)

## 2010-05-26 LAB — BRAIN NATRIURETIC PEPTIDE: Pro B Natriuretic peptide (BNP): 135 pg/mL — ABNORMAL HIGH (ref 0.0–100.0)

## 2010-05-26 LAB — HEMOGLOBIN A1C
Hgb A1c MFr Bld: 5.8 % (ref 4.6–6.1)
Mean Plasma Glucose: 120 mg/dL

## 2010-05-26 LAB — LIPID PANEL
HDL: 14 mg/dL — ABNORMAL LOW (ref >39)
Total CHOL/HDL Ratio: 4.8 RATIO
VLDL: 10 mg/dL (ref 0–40)

## 2010-05-26 LAB — POCT CARDIAC MARKERS
CKMB, poc: 6.9 ng/mL (ref 1.0–8.0)
Myoglobin, poc: >500 ng/mL (ref 12–200)

## 2010-05-26 LAB — TYPE AND SCREEN

## 2010-05-26 LAB — APTT: aPTT: 63 seconds — ABNORMAL HIGH (ref 24–37)

## 2010-05-26 LAB — TROPONIN I: Troponin I: 0.05 ng/mL (ref 0.00–0.06)

## 2010-06-29 NOTE — Consult Note (Signed)
NAMEDIMARCO, Howard Torres NO.:  [id number]   MEDICAL RECORD NO.:  [id number]          PATIENT TYPE:  INP   LOCATION:                               FACILITY:  [facility]   PHYSICIAN:  Hillis Range, MD       DATE OF BIRTH:  [birth year]   DATE OF CONSULTATION:  DATE OF DISCHARGE:                                 CONSULTATION   REQUESTING PHYSICIAN:  Howard R. Jacinto Halim, MD   REASON FOR CONSULTATION:  Atrial flutter.   HISTORY OF PRESENT ILLNESS:  Mr. Howard Torres is a pleasant 75 year old  gentleman with a history of severe emphysematous lung disease, morbid  obesity, ongoing tobacco use, medical noncompliance, presumed coronary  artery disease, chronic renal insufficiency, and cor pulmonale, who is  admitted with acute hypercarbic respiratory failure on May 26, 2008.  Upon arrival, the patient was noted to be profoundly dyspneic and  complained of cough and congestion.  He was noted to have significant  wheezing with decreased oxygen saturations and a carbon dioxide by ABG  of 90.  The patient was admitted to the intensive care unit and  initiated on BiPAP with some improvement.  He declined intubation and  subsequently BiPAP was withdrawn.  Fortunately, the patient has had  remarkable clinical improvement in his shortness of breath.  He is  presently lethargic but feels that his shortness of breath is gradually  improving.  He denies chest pain, palpitations, orthopnea, PND, or lower  extremity edema.  Upon arrival, the patient has been documented to have  an atrial flutter.  His ventricular rates have predominantly been in the  70s.  Telemetry has also revealed intermittent atrial fibrillation.  The  patient was initiated on Coumadin and heparin and cardioverted on May 28, 2008.  He converted to sinus rhythm with multiple PVCs and  subsequently returned to atrial flutter within several hours.  He has  continued to have atrial flutter and intermittent atrial fibrillation  since  that time.  The patient is unaware of any symptoms with his atrial  arrhythmias.   PAST MEDICAL HISTORY:  1. Severe COPD.  2. Morbid obesity.  3. Ongoing tobacco use.  4. Presumed coronary artery disease though the patient has free      previously declined cardiac catheterization.  5. Cor pulmonale.  6. Chronic renal insufficiency with a baseline creatinine of 2.  7. Medical noncompliance.  8. Hyperlipidemia.  9. Hypertension.  10.Benign prostatic hypertrophy.   ALLERGIES:  No known drug allergies.   HOME MEDICATIONS:  1. Crestor 20 mg daily.  2. Torsemide 40 mg twice a day.  3. Diovan 80 mg daily.  4. Omeprazole 20 mg daily.  5. Lisinopril 10 mg daily.  6. Metoprolol 12.5 mg b.i.d.  7. Flomax 0.4 mg daily.  8. Xanax 1 mg t.i.d.  9. Levaquin 750 mg daily.  10.Vitamin D 50,000 units weekly.  11.Albuterol p.r.n.  12.Aspirin 81 mg daily.  13.Percocet p.r.n.   CURRENT MEDICATIONS:  1. Azithromycin 500 mg IV daily.  2. Rocephin 1 g IV daily.  3. Xanax 1 mg p.o. t.i.d.  4. Protonix 40 mg daily.  5. Crestor 20 mg nightly.  6. Lyrica 75 mg b.i.d.  7. Aspirin 325 mg daily.  8. Coumadin.  9. Oxygen.   SOCIAL HISTORY:  The patient lives in [place] with his spouse.  He smokes one pack per day and has done so chronically.  He is retired.   FAMILY HISTORY:  The patient is somnolent and unable to provide.   REVIEW OF SYSTEMS:  The patient is somnolent and unable to provide.   PHYSICAL EXAMINATION:  Telemetry reveals typical-appearing atrial  flutter with ventricular rates predominantly in the 70s.  The patient  also has intermittent but rare atrial fibrillation.  After cardioversion  to sinus rhythm, April 14, 73, the patient was documented to have very  frequent premature atrial contractions.  VITALS:  Blood pressure 100/43, respirations 11, sats 93% on 2 L, heart  rate 78.  GENERAL:  The patient is a chronically, ill morbidly obese gentleman in  no acute  distress.  He is lethargic but able to interact, frequently  falls asleep during discussion.  HEENT:  Normocephalic, atraumatic.  Sclerae clear.  Conjunctivae pink.  Oropharynx clear.  NECK:  Supple.  No thyromegaly, JVD, or bruits.  LUNGS:  Diffuse expiratory wheezing with coarse breath sounds  throughout.  He has a prolonged expiratory phase.  HEART:  Regular rate and rhythm with diminished heart sounds throughout.  GI:  Soft, nontender, nondistended.  Positive bowel sounds.  EXTREMITIES:  No clubbing or cyanosis.  He has diffuse dependent edema.  MUSCULOSKELETAL:  No deformity or atrophy.  PSYCHIATRIC:  Euthymic mood.  Full affect.  NEUROLOGIC:  Cranial nerves II through XII are intact.  Strength and  sensation are intact.  SKIN:  No ecchymoses or lacerations.   EKG reveals typical-appearing atrial flutter, though the cycle length of  the flutter is only 180 msec.  The ventricular rate is 64 beats per  minute.  An inferior infarct pattern is present.   LABORATORY DATA:  White blood cell count is 17, hematocrit 41, platelets  231.  Potassium 4.7, bicarb 41, creatinine 1.35.  BNP 135, hemoglobin  A1c 5.8.  TSH 0.677.   CHEST X-RAY:  I have reviewed the patient's chest x-ray presently today  and this reveals cardiomegaly with diffuse interstitial opacities.   IMPRESSION:  Mr. Howard Torres is a very pleasant 75 year old gentleman with a  history of chronic obstructive pulmonary disease, ongoing tobacco use,  cor pulmonale, presumed coronary artery disease, and ongoing tobacco  use, who is admitted with acute decompensated hypercarbic respiratory  failure.  The patient has had remarkable improvements during his  hospital stay with treatment of his chronic obstructive pulmonary  disease exacerbation.  He does not appear to have significant heart  failure at this time.  He is noted to have atrial flutter as well as  intermittent atrial fibrillation.  He was cardioverted yesterday to  sinus  rhythm and had very frequent premature ventricular contractions  before returning to atrial flutter.  I suspect that long term it will be  very difficult to maintain sinus rhythm in this patient due to his cor  pulmonale and chronic lung disease.  I am not completely convinced that  he is overtly symptomatic with atrial fibrillation or atrial flutter at  this time.  His atrial flutter appears to be typical though the cycle  length is quite fast and may represent an atypical flutter.  He would be  high risk for catheter ablation of his  atrial flutter given his acute  and chronic lung disease.  He is appropriately anticoagulated with  Coumadin at this time and well rate controlled.   PLAN:  Therapeutic strategies for atrial flutter and atrial fibrillation  were discussed in detail with the patient today.  Risks, benefits, and  alternatives to EP study and radiofrequency ablation for atrial flutter  were discussed.  The patient's CHADS-2 score is 2, and I think he would  benefit from chronic anticoagulation with Coumadin for atrial  fibrillation even if his atrial flutter is addressed.  If the patient  develops clear symptomatic atrial flutter or difficult-to-control  ventricular rates, then we could consider catheter ablation for atrial  flutter at that time once his respiratory failure has improved.  However, should the patient have clinical improvement overall, I think  it would be very reasonable to continue him long term on a rate control  strategy with chronic anticoagulation with Coumadin.  We could readdress  the issue of catheter ablation as an outpatient once his acute  respiratory failure has resolved.      Hillis Range, MD  Electronically Signed     JA/MEDQ  D:  05/29/2008  T:  05/30/2008  Job:  [id number]   cc:   Cristy Hilts. Jacinto Halim, MD

## 2010-06-29 NOTE — H&P (Signed)
NAMEGABREIL, YONKERS               ACCOUNT NO.:  [id number]   MEDICAL RECORD NO.:  [id number]          PATIENT TYPE:  INP   LOCATION:  2610                         FACILITY:  [facility]   PHYSICIAN:  Melissa L. Ladona Ridgel, MD  DATE OF BIRTH:  Mar 05, 1935   DATE OF ADMISSION:  07/08/2008  DATE OF DISCHARGE:                              HISTORY & PHYSICAL   CHIEF COMPLAINT:  Leg swelling, weakness and breathing trouble.   PRIMARY CARE PHYSICIAN:  [employer].   HISTORY OF PRESENT ILLNESS:  The patient is a 75 year old white male  with a past medical history for coronary artery disease, cor pulmonale,  who states that over the last 2 months he has been struggling with lower  extremity edema, left greater than right.  The patient states that  yesterday he developed increasing shortness of breath, acute on chronic  and continues to have worsening leg swelling.  He took the medication  that was described for him by Dr. Jacinto Halim who recently increased his  diuretics. He states that he did not get any relieve with the Lasix he  took so he took extra because he did not urinate and he continued to be  short of breath.  The patient took at least 2 extra tablets and  subsequently used other medications which I suspect were his other  diuretics to try to relieve his symptoms but he was unable to pass any  urine and he continued to be short of breath.  The patient came to the  emergency room for further care and was found to be in acute renal  failure with creatinine in the 2's and a high potassium.  The patient  did relate that he had been having burning with urination recently.  I  have been asked to admit the patient for further care of his new onset  renal disease.   REVIEW OF SYSTEMS:  He said his weight has been about the same.  EYES:  No blurred vision, double vision.  ENT:  No tinnitus, dysphagia or  discharge.  The patient did report some scintillating bright lights  approximately 4  months ago in the left eye greater than right.  This has  improved.  He has not had any symptoms for a while but he says he had to  pull off the road because he could not see very well.  CARDIOVASCULAR:  He did not describe any chest pain but he does describe an irregular  heart rate.  RESPIRATORY:  He does describe positive shortness of  breath.  The patient has been sleeping in a recliner for some time and  for the past several days says this has been worse than usual.  GENITOURINARY:  The patient describes some burning and at this time is  having urinary retention.  NEUROLOGIC:  The patient has no history of  seizure disorder but he does have occasional headache.  MUSCULOSKELETAL;  The patient has chronic shoulder pain in the right which he takes opiate  medications for.  HEMATOLOGIC:  No bleeding disorder. PSYCHIATRIC:  No  history of depression or anxiety.  ALLERGIES:  No known drug allergies..   CURRENT MEDICATIONS:  1. Crestor 20 mg daily.  2. Flomax 0.4 mg daily.  3. Omeprazole 20 mg daily.  4. Symbicort 80/4.5 mcg 2 puffs twice daily.  5. Vitamin D 50,000 units on Tuesday.  6. ProAir HFC 90 mcg inhaled 2 puffs every 4 hours as needed.  7. Lyrica 75 mg twice daily.  8. Alprazolam 1 mg 1-3 times daily.  9. Warfarin sodium 5 mg p.o. q.h.s.  10.Aspirin 81 mg daily.  11.Diazepam 10 mg, 1-3 mg as needed.  12.Diltiazem 120 mg q.24 hours.  13.Oxycodone 10/325 mg, 1-4 times daily.  14.Amoxicillin 125 mg 1-2 times daily.  15.Oxygen via nasal cannula.  16.Nasonex 50 mcg two sprays daily.  17.Tramadol 50 mg daily.  18.Furosemide 40 mg twice daily.  19.Klor-Con 10 mEq two tablets twice daily.  20.Losartan 25 mg daily.  21.Torsemide 20 mg twice daily, 2 tablets.  22.Digoxin 25 mcg daily.   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  He quit tobacco 50  years ago. He does not drink.  His daughter's name is Dennie Bible, [phone] is  her number and she is his next of kin decision  maker.   ADVANCED DIRECTIVES:  He states that he would like to have CPR and  ventilatory support for a short period of time.  If the quality of life  is going to be affected, he would not want to continue that.   FAMILY HISTORY:  Mom is deceased at age 82 secondary to pneumonia,  myocardial infarctions and strokes. She also had colon cancer.  Dad is  deceased with a history of CHF and lung disease.  He had a brother who  died at age 24 from lung cancer.  Brother died at age 93 from a  myocardial infarction.  Brother age 69 is in good health.   VITAL SIGNS:  Temperature 98.3, blood pressure 87.53, pulse 66 and he is  trending down as low as the mid 40s.  Respiratory rate is 18, saturation  100%.  GENERAL:  This is a moderately obese white male in no acute distress.  He is normocephalic, atraumatic.  Pupils are equal, round and reactive  to light. Extraocular muscles intact.  Examination of the ears revealed  tympanic membranes are intact.  Examination of the nose reveals septum  midline without discharge.  Examination of the mouth revealed decreased  dentition with no oral lesions or lip lesions.  Trachea midline.  NECK:  Supple, no JVD, no lymph nodes or carotid bruits. '  CHEST:  Decreased without rales, rhonchi or wheezing but I certainly do  not hear good air entry.  CARDIOVASCULAR:  Regular rate and rhythm.  Positive S1 and S2, no S3,  S4. No murmurs, rubs or gallops.  ABDOMEN:  Obese, nontender, nondistended.  Positive bowel sounds. No  guarding or rebound.  No hepatosplenomegaly is noted.  EXTREMITIES:  Nonpitting tense edema, left leg greater than right.  There are 2+ pulses present.  PSYCHIATRIC:  Affect is appropriate.  He has good sense of humor, range  of emotions.  Judgement and insight are intact.  Recent and remote  memory are seemingly intact.  He only requires minimal assistance from  his daughter.   His white count is 9.2, hemoglobin 11.9, hematocrit 35.8, platelet  count  290,000.  D-dimer is 1.14.  In the emergency room, Dopplers of the lower  extremities were obtained which were negative for DVT.  His sodium  is  127, potassium 5.4, chloride 88, CO2 35, BUN 40, creatinine 2.6, glucose  121.  Myoglobin #1 is 335, #2 is 148; MB fraction is 1.7 and 1.4  respectively and his troponins are less than 0.05 x2.  PT 22.6 with an  INR of 1.9, BNP 63.  Chest x-ray showed chronic obstructive pulmonary  disease with no obvious congestive heart failure or infiltrate.   ASSESSMENT AND PLAN:  This is a 75 year old white male with a history of  coronary artery disease, being seen by Dr. Jacinto Halim and having his  medications adjusted for worsening right sided heart failure. The  patient had increased doses of diuretics ordered and in addition to that  took extra doses over the past couple of days, specifically yesterday  when he did not get any response in terms of urine output.  The patient  was found to be in renal failure with elevated potassium.  The monitor  shows he is in flutter, rate controlled, and intermittently bradycardic.  1. Acute and chronic renal failure, secondary to diuretics and heart      failure as well as end-organ disease related to his cardiac      disease.  The patient has taken extra diuretics and is now found to      be in renal failure.  The patient was treated with calcium      gluconate, bicarbonate, insulin and D50 to assist with his elevated      potassium.  For now I need to hold his diuretics.  We are      rehydrating him judiciously as he has had no urine output.  A Foley      catheter will be placed, follow strict Is and Os and at this time,      I will consult cardiology because over the course of the evening he      has had significant bradycardia with an inability to raise his      systolic blood pressure to an appropriate level to perfuse his      kidneys.  For now hold his ARB.  The old chart states he has an ACE      allergy  secondary to complications from ACE inhibition likely      related to his renal disease.  Will obtain renal ultrasound in the      morning and request a nephrology consultation.  2. Pulmonary, chronic obstructive pulmonary disease with obstructive      sleep apnea.  He currently is not able to use his CPAP because he      had a recent ruptured blood vessel in his nose that bled profusely.      He has not been cleared for CPAP.  We will monitor him, use oxygen      and restart his CPAP as soon as possible.  3. Gastroesophageal reflux disease.  Omeprazole will be used for his      GERD.  4. Cor pulmonale.  His last ejection fraction was 55%-65%.  He has      trivial aortic, mitral and pulmonic regurgitation.  At this time, I      have been unable to get the patient's systolic blood pressure into      a reasonable perfusing pressure, despite 3 liters of fluid.  The      patient may need pressors to assist with diuresis and I have      consulted to come see the patient this evening.  The  patient will      be on the step down unit for further close observation.  5. Deep vein thrombosis prophylaxis.  His INR is 12.9.  Will give him      his Coumadin tonight.  I do not think he needs to have any      anticoagulation beyond that.  The patient is in atrial flutter,      rate controlled, and at times bradycardic.  I will obtain a digoxin      level to assure that he is not toxic and again will ask cardiology      to evaluate the patient.  6. Anemia.  Will check iron studies and check his stools to see if he      having any blood loss.   Total time on this case was from approximately 10:10 p.m. to  approximately 1:30 a.m.  Approximately 1 hour of time was dedicated to  critical care management for bradycardia, hypotension and a little bit  of hematuria related to catheter placement.      Melissa L. Ladona Ridgel, MD  Electronically Signed     MLT/MEDQ  D:  07/09/2008  T:  07/09/2008  Job:   [id number]   cc:   [facility] R. Jacinto Halim, MD

## 2010-06-29 NOTE — Discharge Summary (Signed)
Howard Torres, Howard Torres NO.:  [id number]   MEDICAL RECORD NO.:  [id number]          PATIENT TYPE:  INP   LOCATION:  2019                         FACILITY:  [facility]   PHYSICIAN:  Cristy Hilts. Jacinto Halim, MD       DATE OF BIRTH:  1935-03-15   DATE OF ADMISSION:  05/25/2008  DATE OF DISCHARGE:  05/31/2008                               DISCHARGE SUMMARY   DISCHARGE DIAGNOSES:  1. Acute hypercarbic respiratory distress, resolved.  2. Acute-on-chronic respiratory failure.  3. Chronic respiratory failure.  4. Chronic obstructive pulmonary disease exacerbation.  5. Cor pulmonale.  6. Leukocytosis with pneumonia, resolved.  7. Coronary artery disease by CT, has not wanted cardiac      catheterization.  8. Acute-on-chronic renal failure, resolved at discharge to chronic      renal insufficiency.  9. Hypertension, controlled.  10.Dyslipidemia.  11.Atrial fibrillation with cardioversion, successful x1 hour then      back to atrial fibrillation/flutter.  12.Hypotension.  13.Tobacco abuse, counseled on stopping.  14.Obstructive sleep apnea, on CPAP at night.  15.Hyperkalemia, treated.  16.Hypotension, on dobutamine with fever and pneumonia.  17.Anticoagulation, now on Coumadin, Lovenox crossover at discharge.  18.Cyst wound on the back.   DISCHARGE CONDITION:  Much improved.   PROCEDURES:  On May 28, 2008, discontinue cardioversion with 1 shock  at 120 J, continued atrial fibrillation, second shock at 180 J under  anesthesia to sinus rhythm with premature atrial contractions.  Unfortunately, sinus rhythm lasted approximately 1 hour and then back to  AFib/flutter.   DISCHARGE MEDICATIONS:  1. Crestor 20 mg daily.  2. Torsemide was discontinued and stopped.  3. Diovan 80 mg was stopped, but will be restarted as an outpatient      once he has recovered from his renal failure completely.  4. Omeprazole 20 mg daily.  5. Lisinopril was stopped due to renal failure, acute and  severe COPD.      He should not take ACE inhibitors.  6. Metoprolol 12.5 twice a day was stopped secondary to severe COPD.  7. Flomax 0.4 mg daily.  8. Alprazolam 1 mg as needed 3 times a day.  9. Vitamin D 50,000 units weekly.  10.Albuterol as needed inhaler.  11.Aspirin 81 mg daily.  12.Home oxygen 2 L per nasal cannula, this is new.  13.Prednisone 20 mg tablets take 2 tablets on June 01, 2008, June 02, 2008, and June 03, 2008; then take 1 tablet daily June 04, 2008, June 05, 2008, and June 06, 2008.  14.Coumadin 5 mg daily.  15.Lovenox 100 mg subcutaneously every 12 hours.  16.Oxycodone and acetaminophen 10/325 as needed.  17.Stool softener daily.  18.Laxative as needed.  19.Lyrica 75 mg one twice a day.  20.Valium 10 mg once or twice a day as needed, those were her home      medicines.   DISCHARGE INSTRUCTIONS:  1. Low-sodium heart-healthy diet.  2. Increase activity slowly.  May shower.  No driving for 2 weeks.  3. Pack back wound with iodoform 4x4 and change  daily.  4. Follow up with Dr. Jacinto Halim and the office will call with date and      time.  5. Have protime, Coumadin checked on Monday at [facility].  They will call you Monday to tell you what time to come      and then hopefully we can stop Lovenox.  6. Continue CPAP at night.  7. Follow up with [facility] with Dr. Johney Frame on Thursday, June 12, 2008, at 2 p.m.   HISTORY OF PRESENT ILLNESS:  A 75 year old white male with history of  severe COPD, cor pulmonale, chronic ongoing tobacco use at admission,  and severe calcification of course by CT though he has refused cardiac  cath, presents to [facility] on May 26, 2008, with shortness of breath.  Symptoms actually started several months prior to admission, shortness  of breath, nonproductive cough, and congestion.  His activity decreased  because of inability to tolerate much activity.  By the time of  admission, his  symptoms were very severe and did have a tight cough,  increased wheezing, abdominal fullness, low-grade fever for 2 weeks 99  to 100, overall just felt poorly.  Denied chest pain or pressure or  heaviness.  He did complain of tachycardia and palpitations.  No  syncope.  He was in sinus rhythm at 100 on arrival to [facility] in his  chest x-ray, cardiomegaly with interstitial edema.  He had received  Lasix, pCO2 on arrival was 90 and his oxygen saturation on arrival was  70%.  Once he was started on BiPAP, it improved to 96%.  The patient was  admitted to the Intensive Care Unit and Pulmonary consult was obtained.   PAST MEDICAL HISTORY:  As stated previously;  1. COPD.  2. Cor pulmonale.  3. Dyslipidemia.  4. Renal insufficiency.  5. Baseline creatinine at 2.  6. Hypertension.  7. Ongoing tobacco use.   FAMILY HISTORY/SOCIAL HISTORY/REVIEW OF SYSTEMS:  See H and P.   PHYSICAL EXAMINATION AT DISCHARGE:  VITAL SIGNS:  Blood pressure 121/70,  pulse 66, respirations 24, temperature 98.2, and oxygen saturation on 2  L 91%.  HEART:  S1 and S2 irregularly irregular.  LUNGS: Fairly clear.  ABDOMEN: Positive bowel sounds.  Obese.  EXTREMITIES: Without edema.  Positive vascularity is of lower  extremities.  NEUROLOGIC:  Alert and oriented x3, continued in AFib some pauses, and  because of those small pauses, unable to add any dig to his medical  regimen.  Currently, he is not a candidate for permanent pacemaker, but  this may not remain so in the future.   LABORATORY VALUES:  Initial blood gases; pH 7.257, pCO2 of 84, pO2 of  88, bicarb 37.4, total CO2 of 40, and acid base excess 6.0.  Oxygen  saturation at that time was 94, ionized calcium 1.15.  On repeat pH is  7.243, pCO2 of 84, and pO2 of 95.  Prior to BiPAP, pH is 7.65, pCO2 of  75 and pO2 of 56, and acid base is still 6.7.   Hemoglobin on admission 13.9, hematocrit 41, WBC was elevated at 16,  neutrophils 96, lymphs 3, and  monos 0.   Prior to discharge, he had been started on prednisone as well as white  count was 12.2.  He was complaining his antibiotics.  Hemoglobin 15.2,  hematocrit 46, MCV 90, MCHC 33, platelets 192, , lymphocytes 9 and  monocytes 1, lymphocytes  absolute 1.1.   Protime on admission 16.2, INR of 1.3, PTT 60 once he was started on  Coumadin and heparin and these were therapeutic except the INR and at  discharged on Coumadin.  Protime is 19.7 with an INR of 1.6.   He was discharged with Coumadin and Lovenox crossover.  Sodium 132 on  admission, potassium 5.5, chloride 91, CO2 of 31, glucose 134, BUN 36,  and creatinine 1.88.  Total bili 0.9, alkaline phos 71, SGOT 16, SGPT  14, total protein 6.6, albumin 2.6, and calcium 8.8.  Potassium slowly  came down, creatinine peaked at 1.93 with a BUN of 49.  Prior to  discharge, sodium 143, potassium 4.7, chloride 93, CO2 of 41, glucose  134, BUN 34, creatinine down to 1.35, and calcium 9.2.   Hemoglobin A1c was 5.8.   Phosphorus 6.3 to 5.7.  Magnesium was 1.9.  Cardiac enzymes:  CK 141,  268, and 51; MBs 4.8, 5.4, and 3.8; troponin I 0.05 and 0.02, negative  MI.  Myoglobin was up greater than 500.  BNP on admission was 135.   Total cholesterol was 67, triglycerides 49, HDL 14, and LDL 43.  TSH was  0.677.  UA; small amount of bilirubin, large amount of blood, and  protein was 100.   Blood cultures were done and were negative.  Urine culture negative and  that there was no growth.   RADIOLOGY:  Chest x-ray on admission; cardiomegaly with mild  interstitial edema, suspect underlying hyperinflation.  He did undergo  renal ultrasound.  No acute or focal abnormality.  Follow up chest x-ray  on May 27, 2008, decrease in mild interstitial edema pattern and chest  x-ray on May 29, 2008, mild bibasilar atelectasis.   A 2-D echo was done on May 26, 2008, tricuspid regurg mild, right  atrium was mildly dilated, EF was 55-65%.  He had a  trivial aortic valve  regurg and trivial mitral regurg.   HOSPITAL COURSE:  The patient was admitted on May 25, 2008, after  presenting with acute respiratory failure, acute shortness of breath  that had started approximately several months prior, but had increased.  He was admitted and placed on BiPAP.  He was started on IV antibiotics  as his white count was elevated, concerned for pneumonia, and was felt  to be a COPD exacerbation hypercarbic.  His blood pressure was low and  he was placed on dopamine drip, combination of some heart failure and  COPD exacerbation.  The patient was in and out of atrial  fibrillation/flutter during his acute episode.  He was on BiPAP during  the day by May 26, 2008, family had a long discussion with pulmonary  taking the patient off the BiPAP.  He did not want to be on it.  He had  already refused intubation, so they felt they would start him on some  morphine and stop the BiPAP, they did and by the next morning, his  respirations had actually improved.  He was less short of breath.  He  continued with fevers, continued in AFib/flutter.  By May 28, 2008, at  times the heart rate was elevated, it was felt cardioversion would be  beneficial.  He underwent cardioversion with a total of 2 shocks.  On  May 28, 2008, he did convert on second shock to sinus rhythm with  multiple PACs.  Unfortunately, it lasted 1 hour and he went back into  AFib/flutter.  He continued to improve.  He  was continued with  antibiotics.  He was on IV steroids to assist with his COPD.  We asked  the EP to see him for further evaluation and ablation.  They felt at  this time, he was still too ill from a respiratory status to undergo an  ablation, but that would be planned in the future.  Currently, the  patient prefers just to be on Coumadin and remain in AFib.  He was  weaned to p.o. steroids.  He was placed on p.o. antibiotics.   The patient's granddaughter was getting  married on May 31, 2008, and  he wanted to be home for that.  He had improved to such a great extent,  we felt this was manageable.  We arranged for home oxygen.  He will  continue with Coumadin and Lovenox crossover and decreasing dose of  steroids.  His last dose of antibiotics was the day of discharge and  that was an oral dose.  He will follow up with Dr. Jacinto Halim as well as  [facility] and Dr. Johney Frame for future discussion concerning  ablation.  Please note, the patient his now considered allergic to ACE  INHIBITOR.       Darcella Gasman. [place], N.P.      Cristy Hilts. Jacinto Halim, MD  Electronically Signed    LRI/MEDQ  D:  05/31/2008  T:  06/01/2008  Job:  [id number]   cc:   Critical Care Medicine  Aida Puffer  Hillis Range, MD

## 2010-06-29 NOTE — Discharge Summary (Signed)
NAMEESMOND, HINCH NO.:  [id number]   MEDICAL RECORD NO.:  [id number]           PATIENT TYPE:   LOCATION:                                 FACILITY:   PHYSICIAN:  Beckey Rutter, MD  DATE OF BIRTH:  [birth year]   DATE OF ADMISSION:  DATE OF DISCHARGE:                               DISCHARGE SUMMARY   PRIMARY CARE PHYSICIAN:  Medical laboratory scientific officer.   CARDIOLOGIST:  Cristy Hilts. Jacinto Halim, MD with [facility].   BRIEF HISTORY OF PRESENT ILLNESS AND HOSPITAL COURSE:  A 75 year old  pleasant Caucasian male with past medical history significant for  coronary artery disease, cor pulmonale, and arrhythmia presented with  increasing shortness of breath.  1. Shortness of breath:  This was felt multifactorial with a      combination of COPD with exacerbation, as well as worsening      shortness of breath and increasing lower extremities edema.  The      patient was treated with intravenous antibiotic as well as steroid.      His Lasix was increased to 80 in the morning and 40 mg at night.      The patient had very low blood pressure on admission, and he was      kept on dopamine in the first 24 hours, which was changed to      dobutamine as per the Cardiology recommendation.  Currently, the      patient diuresed well, his lower extremities improved, and his      shortness of breath and work of breathing has improved.  2. Obtundation/altered mental status:  In the first 48 hours, the      patient was obtunded which felt secondary to high hypercapnia as      documented per the EKG.  He also has acidosis, which is respiratory      in nature.  The patient responded to CPAP, as well as a diuretic,      and the management for the COPD.  Currently, he had BiPAP at night      with a nasal cannula and he has been doing well.  He was discharged      to continue on 24-hour oxygen, as well as the CPAP at night.  3. Anxiety:  The patient is taking Xanax and Ativan.  He will  be      discharged to continue on Xanax only.  4. Arrhythmia with pauses:  As documented on the telemetry monitoring,      the patient has pauses, which warrant evaluation by      electrophysiological study with a possibility of pacemaker or      ablation.  The patient refused further workup in this regard, and      he wanted to follow up with his Cardiology to be reevaluated and      considered for arrhythmia workup.  Currently, he will be continued      on Coumadin with stable INR currently.  He was advised to follow up      with his primary within 4 days to  check his INR.  I will discharge      him with 5 mg daily.  5. Worsening heart failure:  This was felt secondary to excessive use      of diuresis at home in the setting of low blood pressure.  The      patient's kidney function improved.   HOSPITAL PROCEDURES:  1. Jul 09, 2008, 2-D echocardiogram with left ventricular ejection      fraction estimated at 55-65, normal ventricular cavity with a      ventricular wall hypertrophy.  2. Jul 09, 2008, a renal ultrasound.  Impression:  a.  No evidence of obstructive uropathy.  b.  Increased cortical echogenicity suggesting medical renal disease.  1. Jul 08, 2008, the patient had chest x-ray.  Impression:  Chronic obstructive pulmonary disease with bibasilar  atelectasis.   RECENT LABORATORY DATA:  Reflecting INR 2.3.  Sodium 139, potassium 3.7,  chloride is 93, bicarbonate is 37, glucose is 103, BUN is 11, creatinine  is 1.1.  White blood count 7.2, hemoglobin is 11.1, hematocrit 33.7,  platelet count is 173.   DISCHARGE DIAGNOSES:  1. Chronic obstructive pulmonary disease with exacerbation.  2. Arrhythmia/atrial fibrillation/atrial flutter with failed aberrancy      conduction.  3. History of coronary artery disease.  4. Chronic obstructive pulmonary disease with acute exacerbation,      likely resulting in cor pulmonale.  5. Acute on chronic renal failure, improved.  6.  Chronic back pain.  7. Anxiety.   DISCHARGE MEDICATIONS:  1. Crestor 20 mg daily.  2. Flomax 0.4 mg daily.  3. Omeprazole 20 mg daily.  4. Symbicort 80/4.5 mg 2 puffs twice a day.  5. Vitamin D 500 daily.  6. ProAir HCV 90 mcg inhaled 2 puffs every 4 hours p.r.n.  7. Lyrica 75 mg daily.  8. Xanax 1-2 mg every 4-6 hours p.r.n.  9. Coumadin 5 mg nightly.  10.Aspirin 81 mg daily.  11.Cardizem 120 mg q.24 h.  12.Oxycodone/acetaminophen, dose is 10/325 mg 1-2 tablets every 4      hours p.r.n. basis.  13.Nasal cannula home oxygen 2-3 L continuous.  14.CPAP at night.  15.Nasonex 50 mcg spray daily.  16.Lasix 60 mg twice a day.  17.K-Dur 10 mEq twice a day.  18.Losartan 25 mg daily, to be started on July 19, 2008, i.e., 4 days      from today.  19.Digoxin 25 mcg daily.   DISCHARGE AND FOLLOWUP PLAN:  The patient should follow up with Dr. Yates Decamp with [facility] within 1-2 weeks.  He should follow  up with Dr. Susann Givens within 4 days for INR evaluation.  The patient is  aware of the risk regarding refusing cardiac workup, which could include  death.  His daughter at the bedside, she is also aware of the risk, as  well as discharge and followup plans.      Beckey Rutter, MD  Electronically Signed     EME/MEDQ  D:  07/14/2008  T:  07/14/2008  Job:  [id number]   cc:   [facility]

## 2010-10-02 IMAGING — CR DG CHEST 1V PORT
2 series · 2 of 2 positions shown · non-contrast
Comparison: 05/27/2008

CLINICAL DATA: Respiratory stress.  Short of breath.

PORTABLE CHEST - 1 VIEW

[AP (1 of 2)]
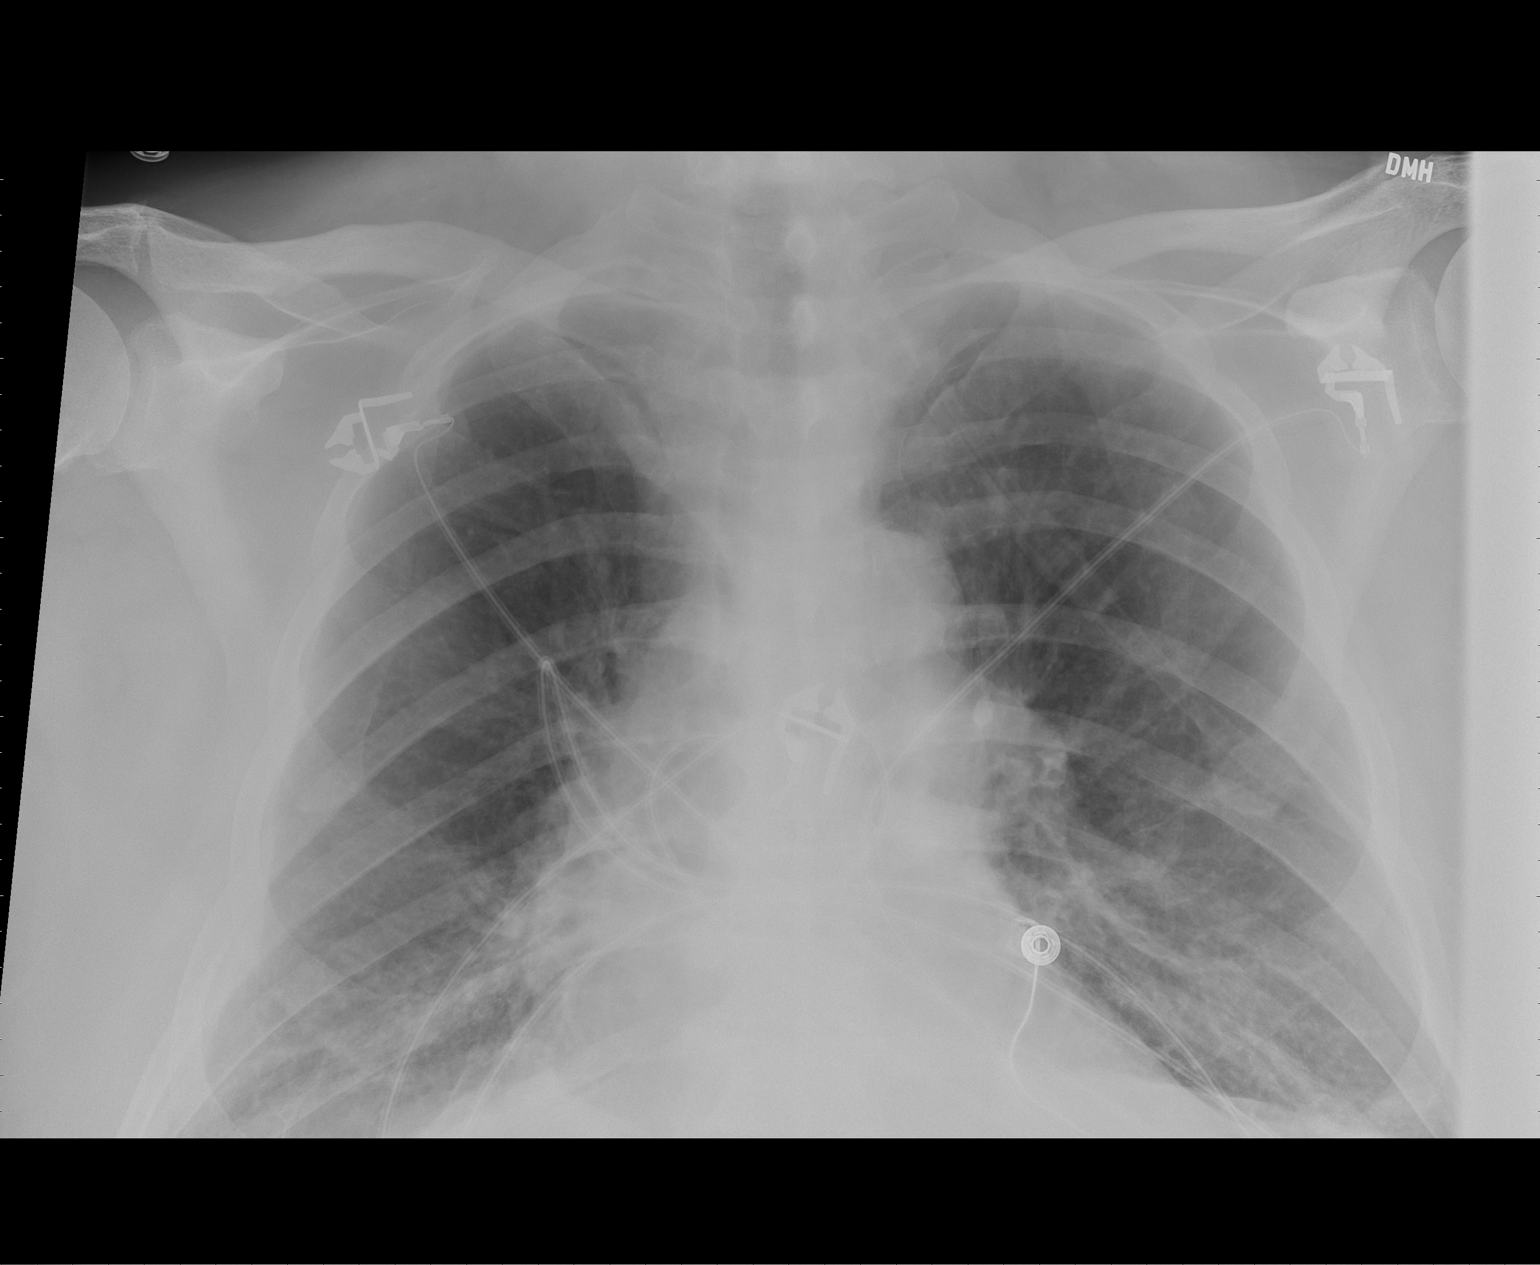

[AP (2 of 2)]
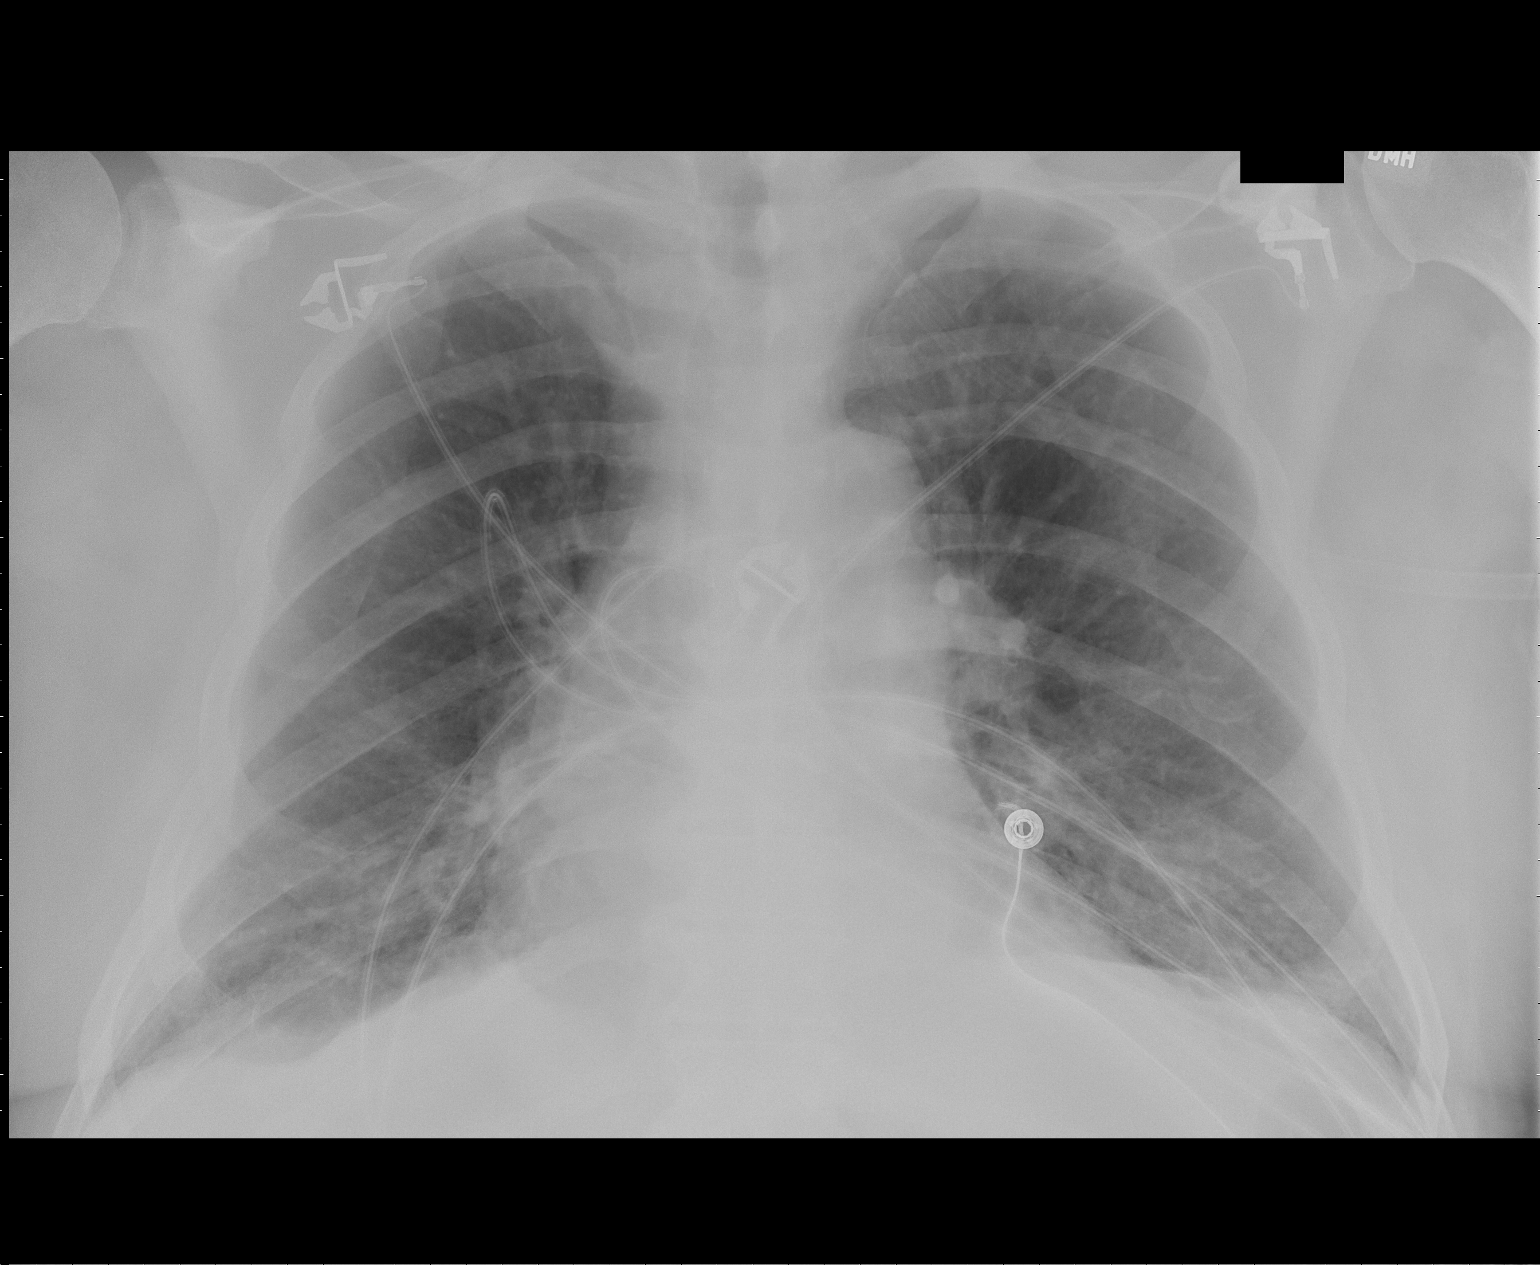

[2 of 2 positions shown; findings below may reference images not displayed]

FINDINGS: Mild atelectasis at the bases.  No airspace opacities.
IMPRESSION: Mild bibasilar atelectasis.

## 2010-10-27 IMAGING — CR DG CHEST 1V PORT
2 series · 2 of 2 positions shown · non-contrast
Comparison: 05/29/2008

CLINICAL DATA: Leg swelling.  Congestive heart failure.  COPD.

PORTABLE CHEST - 1 VIEW

[view not recorded (1 of 2)]
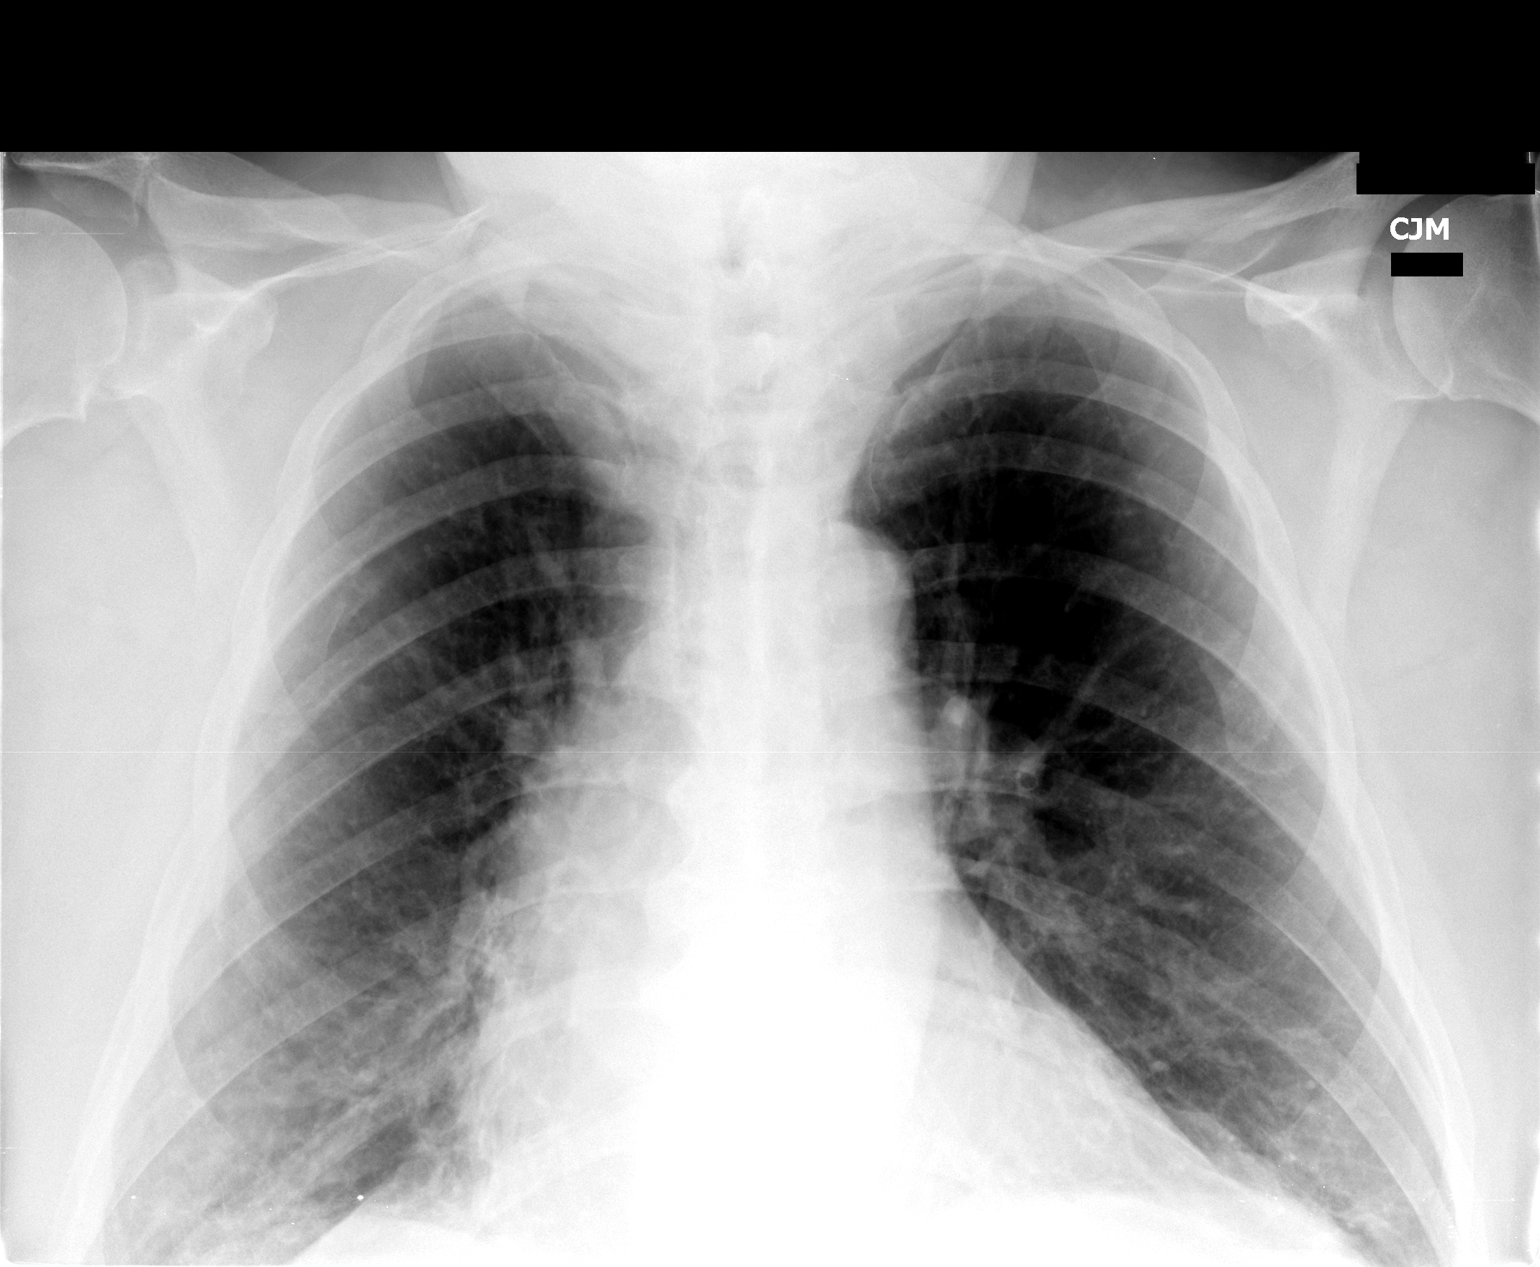

[view not recorded (2 of 2)]
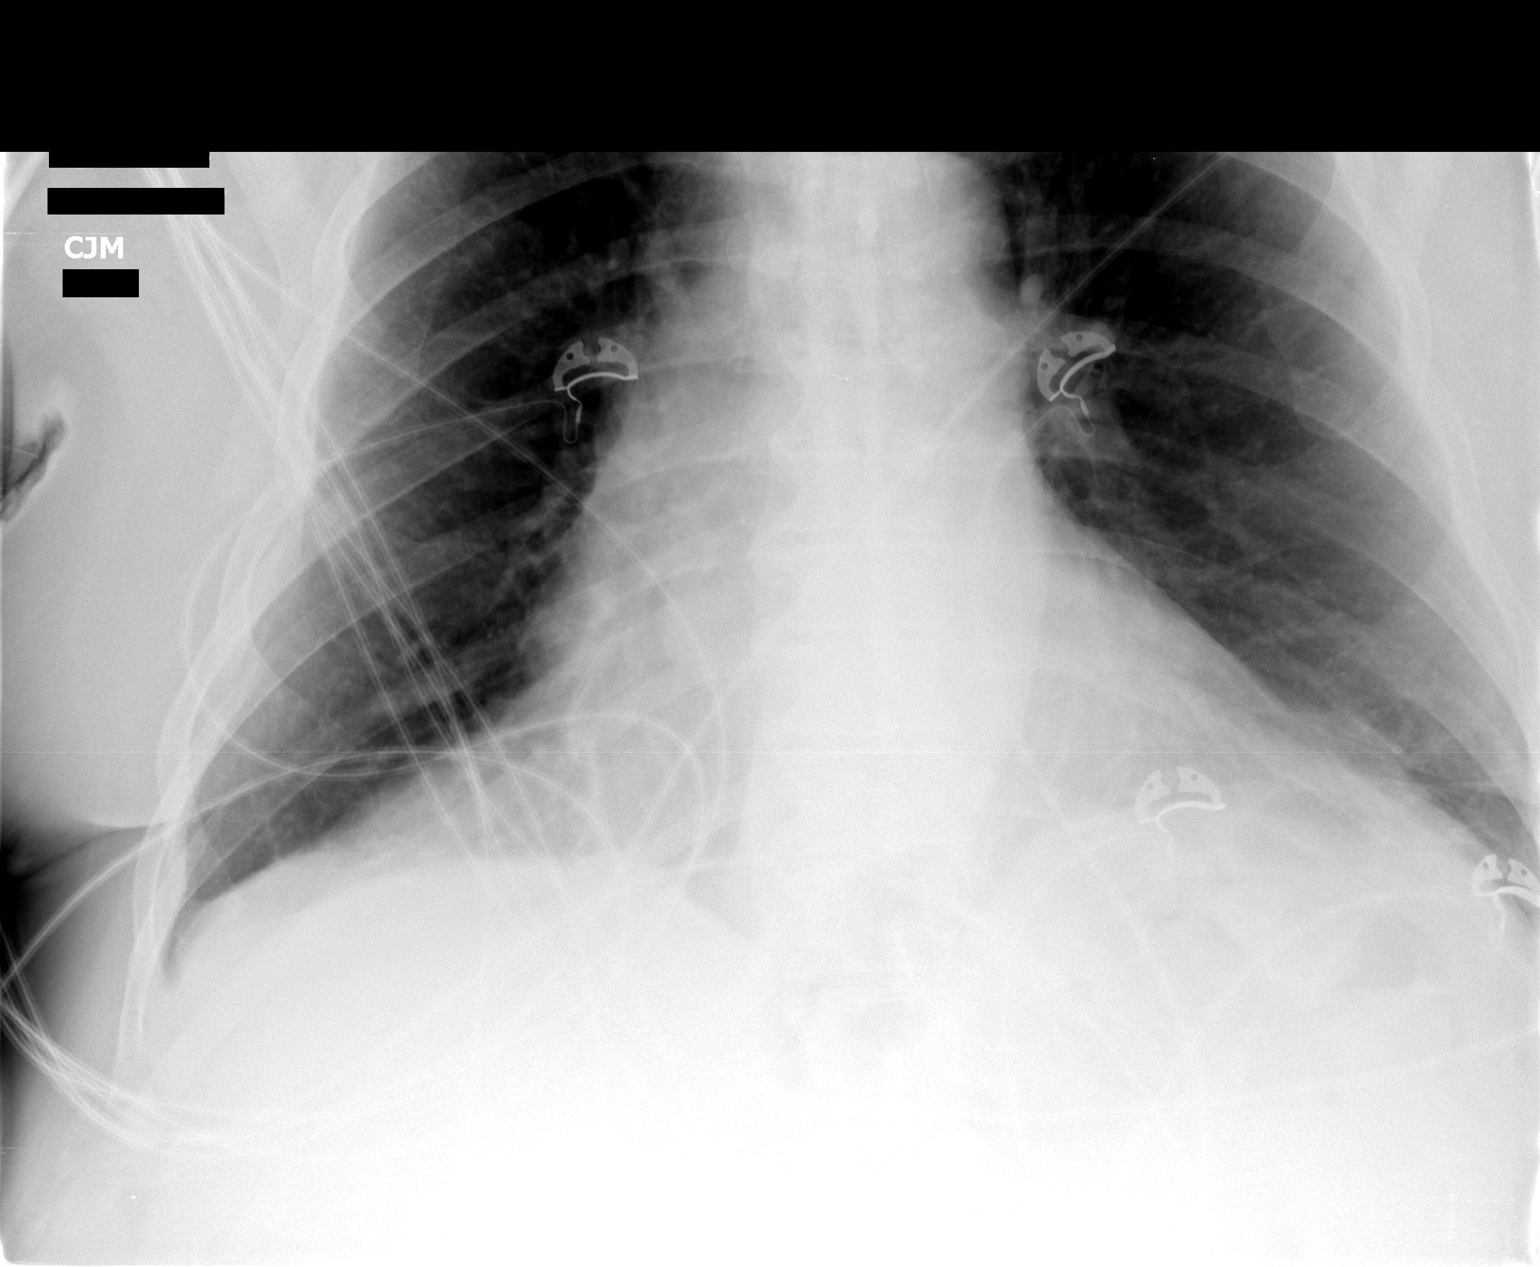

[2 of 2 positions shown; findings below may reference images not displayed]

FINDINGS: Mild cardiomegaly noted with thoracic spondylosis.

Linear opacities are present at both lung bases, favoring mild
bibasilar atelectasis or scarring.  This is similar to the prior
exam.  No specific findings of pneumonia identified. No overt edema
noted.
IMPRESSION: 1.  Persistent bibasilar subsegmental atelectasis or scarring.
2.  Mild cardiomegaly.
3.  Thoracic spondylosis.

## 2010-11-08 LAB — PROTIME-INR

## 2010-11-26 LAB — I-STAT 8, (EC8 V) (CONVERTED LAB)
Acid-Base Excess: 3 — ABNORMAL HIGH
Bicarbonate: 27.2 — ABNORMAL HIGH
Glucose, Bld: 101 — ABNORMAL HIGH
HCT: 51
Hemoglobin: 17.3 — ABNORMAL HIGH
Operator id: 285841
Potassium: 4.2
Sodium: 136
TCO2: 28

## 2010-11-26 LAB — POCT CARDIAC MARKERS: CKMB, poc: 1

## 2010-11-26 LAB — DIFFERENTIAL
Basophils Absolute: 0
Eosinophils Relative: 2
Lymphocytes Relative: 26
Lymphs Abs: 2.6
Neutro Abs: 6.5

## 2010-11-26 LAB — CBC
HCT: 47.6
Hemoglobin: 16.3
Platelets: 232
WBC: 9.9

## 2010-11-26 LAB — POCT I-STAT CREATININE: Operator id: 285841

## 2010-12-24 ENCOUNTER — Other Ambulatory Visit: Payer: Self-pay | Admitting: Dermatology

## 2011-03-01 ENCOUNTER — Other Ambulatory Visit: Payer: Self-pay | Admitting: Dermatology

## 2011-06-21 ENCOUNTER — Other Ambulatory Visit: Payer: Self-pay | Admitting: Dermatology

## 2011-12-29 ENCOUNTER — Other Ambulatory Visit (HOSPITAL_COMMUNITY): Payer: Self-pay | Admitting: *Deleted

## 2011-12-29 DIAGNOSIS — I200 Unstable angina: Secondary | ICD-10-CM

## 2012-01-05 ENCOUNTER — Encounter (HOSPITAL_COMMUNITY): Payer: Self-pay

## 2012-01-18 ENCOUNTER — Encounter (HOSPITAL_COMMUNITY): Payer: Self-pay

## 2012-04-10 ENCOUNTER — Encounter (HOSPITAL_COMMUNITY): Payer: Self-pay | Admitting: Emergency Medicine

## 2012-04-10 ENCOUNTER — Emergency Department (HOSPITAL_COMMUNITY)
Admission: EM | Admit: 2012-04-10 | Discharge: 2012-04-11 | Disposition: A | Discharge status: Home / Self Care | Payer: Medicare Other | Attending: Emergency Medicine | Admitting: Emergency Medicine

## 2012-04-10 DIAGNOSIS — R42 Dizziness and giddiness: Secondary | ICD-10-CM | POA: Insufficient documentation

## 2012-04-10 DIAGNOSIS — Z466 Encounter for fitting and adjustment of urinary device: Secondary | ICD-10-CM

## 2012-04-10 DIAGNOSIS — Z950 Presence of cardiac pacemaker: Secondary | ICD-10-CM | POA: Insufficient documentation

## 2012-04-10 DIAGNOSIS — Z79899 Other long term (current) drug therapy: Secondary | ICD-10-CM | POA: Insufficient documentation

## 2012-04-10 DIAGNOSIS — Y846 Urinary catheterization as the cause of abnormal reaction of the patient, or of later complication, without mention of misadventure at the time of the procedure: Secondary | ICD-10-CM | POA: Insufficient documentation

## 2012-04-10 DIAGNOSIS — R109 Unspecified abdominal pain: Secondary | ICD-10-CM | POA: Insufficient documentation

## 2012-04-10 DIAGNOSIS — T83091A Other mechanical complication of indwelling urethral catheter, initial encounter: Secondary | ICD-10-CM | POA: Insufficient documentation

## 2012-04-10 DIAGNOSIS — J449 Chronic obstructive pulmonary disease, unspecified: Secondary | ICD-10-CM | POA: Insufficient documentation

## 2012-04-10 DIAGNOSIS — F17200 Nicotine dependence, unspecified, uncomplicated: Secondary | ICD-10-CM | POA: Insufficient documentation

## 2012-04-10 DIAGNOSIS — J4489 Other specified chronic obstructive pulmonary disease: Secondary | ICD-10-CM | POA: Insufficient documentation

## 2012-04-10 DIAGNOSIS — Z7982 Long term (current) use of aspirin: Secondary | ICD-10-CM | POA: Insufficient documentation

## 2012-04-10 DIAGNOSIS — K219 Gastro-esophageal reflux disease without esophagitis: Secondary | ICD-10-CM | POA: Insufficient documentation

## 2012-04-10 DIAGNOSIS — T83090A Other mechanical complication of cystostomy catheter, initial encounter: Secondary | ICD-10-CM

## 2012-04-10 DIAGNOSIS — I509 Heart failure, unspecified: Secondary | ICD-10-CM | POA: Insufficient documentation

## 2012-04-10 HISTORY — DX: Heart failure, unspecified: I50.9

## 2012-04-10 HISTORY — DX: Chronic obstructive pulmonary disease, unspecified: J44.9

## 2012-04-10 HISTORY — DX: Presence of cardiac pacemaker: Z95.0

## 2012-04-10 HISTORY — DX: Gastro-esophageal reflux disease without esophagitis: K21.9

## 2012-04-10 LAB — CBC WITH DIFFERENTIAL/PLATELET
Basophils Relative: 0 % (ref 0–1)
Eosinophils Absolute: 0.1 10*3/uL (ref 0.0–0.7)
HCT: 49.9 % (ref 39.0–52.0)
Hemoglobin: 15.5 g/dL (ref 13.0–17.0)
Lymphs Abs: 2 10*3/uL (ref 0.7–4.0)
MCH: 28.7 pg (ref 26.0–34.0)
MCHC: 31.1 g/dL (ref 30.0–36.0)
MCV: 92.2 fL (ref 78.0–100.0)
Monocytes Absolute: 0.6 10*3/uL (ref 0.1–1.0)
Monocytes Relative: 8 % (ref 3–12)
Neutrophils Relative %: 62 % (ref 43–77)
RBC: 5.41 MIL/uL (ref 4.22–5.81)

## 2012-04-10 LAB — URINALYSIS, ROUTINE W REFLEX MICROSCOPIC
Bilirubin Urine: NEGATIVE
Bilirubin Urine: NEGATIVE
Glucose, UA: NEGATIVE mg/dL
Ketones, ur: NEGATIVE mg/dL
Nitrite: NEGATIVE
Specific Gravity, Urine: 1.014 (ref 1.005–1.030)
Specific Gravity, Urine: 1.012 (ref 1.005–1.030)
Urobilinogen, UA: 1 mg/dL (ref 0.0–1.0)
pH: 8.5 — ABNORMAL HIGH (ref 5.0–8.0)

## 2012-04-10 LAB — BASIC METABOLIC PANEL
BUN: 13 mg/dL (ref 6–23)
Creatinine, Ser: 0.86 mg/dL (ref 0.50–1.35)
GFR calc Af Amer: >90 mL/min (ref >90)
GFR calc non Af Amer: 83 mL/min — ABNORMAL LOW (ref >90)
Glucose, Bld: 80 mg/dL (ref 70–99)
Potassium: 4.3 mEq/L (ref 3.5–5.1)

## 2012-04-10 LAB — URINE MICROSCOPIC-ADD ON

## 2012-04-10 MED ORDER — CEPHALEXIN 500 MG PO CAPS: 500.0000 mg | ORAL_CAPSULE | Freq: Two times a day (BID) | ORAL | Status: DC

## 2012-04-10 MED ORDER — HEPARIN SOD (PORK) LOCK FLUSH 100 UNIT/ML IV SOLN
INTRAVENOUS | Status: AC
  Administered 2012-04-10: 21:00:00
  Filled 2012-04-10: qty 5

## 2012-04-10 MED ORDER — SODIUM CHLORIDE 0.9 % IV SOLN
Freq: Once | INTRAVENOUS | Status: AC
  Administered 2012-04-10: 20:00:00 via INTRAVENOUS

## 2012-04-10 MED ORDER — HYDROMORPHONE HCL PF 1 MG/ML IJ SOLN
1.0000 mg | Freq: Once | INTRAMUSCULAR | Status: AC
  Administered 2012-04-10: 1 mg via INTRAVENOUS
  Filled 2012-04-10: qty 1

## 2012-04-10 MED ORDER — DEXTROSE 5 % IV SOLN
1.0000 g | Freq: Once | INTRAVENOUS | Status: AC
  Administered 2012-04-10: 1 g via INTRAVENOUS
  Filled 2012-04-10: qty 10

## 2012-04-10 NOTE — ED Provider Notes (Signed)
History     CSN: [id number]  Arrival date & time 04/10/12  1820   First MD Initiated Contact with Patient 04/10/12 1937      Chief Complaint  Patient presents with  . clogged catheter     (Consider location/radiation/quality/duration/timing/severity/associated sxs/prior treatment) HPI Comments: Pt comes in with cc of bladder pressure. Pt has suprapubic catheter - and states that he has not urinated in a day now. He has been having pain since the afternoon, and was advised to come to the ED. No n/v/f/c. No hematuria, or clots passage in general.  The history is provided by the patient.    Past Medical History  Diagnosis Date  . COPD (chronic obstructive pulmonary disease)   . Pacemaker   . GERD (gastroesophageal reflux disease)   . CHF (congestive heart failure)     Past Surgical History  Procedure Laterality Date  . Prostate surgery    . Hernia repair      No family history on file.  History  Substance Use Topics  . Smoking status: Current Every Day Smoker  . Smokeless tobacco: Not on file  . Alcohol Use: No      Review of Systems  Constitutional: Negative for fever, chills and activity change.  HENT: Negative for neck pain.   Eyes: Negative for visual disturbance.  Respiratory: Negative for cough, chest tightness and shortness of breath.   Cardiovascular: Negative for chest pain.  Gastrointestinal: Positive for abdominal pain and abdominal distention.  Genitourinary: Negative for dysuria, hematuria, enuresis, difficulty urinating and testicular pain.  Musculoskeletal: Negative for arthralgias.  Neurological: Positive for dizziness. Negative for light-headedness and headaches.  Psychiatric/Behavioral: Negative for confusion.    Allergies  Review of patient's allergies indicates no known allergies.  Home Medications   Current Outpatient Rx  Name  Route  Sig  Dispense  Refill  . albuterol (PROVENTIL HFA;VENTOLIN HFA) 108 (90 BASE) MCG/ACT inhaler  Inhalation   Inhale 2 puffs into the lungs every 6 (six) hours as needed for wheezing or shortness of breath.         Marland Kitchen aspirin 81 MG chewable tablet   Oral   Chew 81 mg by mouth daily.         . colchicine 0.6 MG tablet   Oral   Take 0.6 mg by mouth daily as needed (gout flare-up.).         Marland Kitchen cyanocobalamin (,VITAMIN B-12,) 1000 MCG/ML injection   Intramuscular   Inject 1,000 mcg into the muscle every 30 (thirty) days.         . diazepam (VALIUM) 10 MG tablet   Oral   Take 10 mg by mouth every 6 (six) hours.          . furosemide (LASIX) 20 MG tablet   Oral   Take 20 mg by mouth daily as needed (swelling).          Marland Kitchen lidocaine (LIDODERM) 5 %   Transdermal   Place 1 patch onto the skin. Remove & Discard patch within 12 hours or as directed by MD.  Apply up to 3 patches per day to various sites.         Marland Kitchen omeprazole (PRILOSEC) 20 MG capsule   Oral   Take 20 mg by mouth daily.         Marland Kitchen oxyCODONE-acetaminophen (PERCOCET) 10-325 MG per tablet   Oral   Take 1 tablet by mouth every 6 (six) hours as needed for pain.          Marland Kitchen  potassium chloride (K-DUR,KLOR-CON) 10 MEQ tablet   Oral   Take 10 mEq by mouth daily.         . rosuvastatin (CRESTOR) 10 MG tablet   Oral   Take 10 mg by mouth daily.         . Vitamin D, Ergocalciferol, (DRISDOL) 50000 UNITS CAPS   Oral   Take 50,000 Units by mouth every 7 (seven) days. Wednesdays.         Marland Kitchen warfarin (COUMADIN) 5 MG tablet   Oral   Take 5-7.5 mg by mouth daily. Take 5 mg Monday, Wednesday and Friday. Take 7.5 mg on Tuesday, Thursday, Saturday and Sunday.         . cephALEXin (KEFLEX) 500 MG capsule   Oral   Take 1 capsule (500 mg total) by mouth 2 (two) times daily.   20 capsule   0     BP 136/76  Pulse 77  Temp(Src) 97.8 F (36.6 C) (Oral)  Resp 18  SpO2 80%  Physical Exam  Nursing note and vitals reviewed. Constitutional: He is oriented to person, place, and time. He appears  well-developed.  HENT:  Head: Normocephalic and atraumatic.  Eyes: Conjunctivae and EOM are normal. Pupils are equal, round, and reactive to light.  Neck: Normal range of motion. Neck supple.  Cardiovascular: Normal rate and regular rhythm.   Pulmonary/Chest: Effort normal and breath sounds normal.  Abdominal: Soft. Bowel sounds are normal. He exhibits no distension. There is no tenderness. There is no rebound and no guarding.  Genitourinary:  Suprapubic catheter in place - no urinary drainage. Palpable bladder, with bladder scan showing 750 cc of urine.  Neurological: He is alert and oriented to person, place, and time.  Skin: Skin is warm.    ED Course  SUPRAPUBIC TUBE PLACEMENT Date/Time: 04/10/2012 11:59 PM Performed by: Derwood Kaplan Authorized by: Derwood Kaplan Consent: Verbal consent obtained. Risks and benefits: risks, benefits and alternatives were discussed Consent given by: patient Patient understanding: patient states understanding of the procedure being performed Patient identity confirmed: verbally with patient Time out: Immediately prior to procedure a "time out" was called to verify the correct patient, procedure, equipment, support staff and site/side marked as required. Indications: catheter change and urinary retention Local anesthesia used: no Patient sedated: no Suprapubic aspiration by: catheter Catheter type: Foley Catheter size: 8 Fr Number of attempts: 1 Urine volume: 500 ml Urine characteristics: blood-tinged and dark Patient tolerance: Patient tolerated the procedure well with no immediate complications.   (including critical care time)  Labs Reviewed  CBC WITH DIFFERENTIAL - Abnormal; Notable for the following:    RDW 15.6 (*)    Platelets 142 (*)    All other components within normal limits  BASIC METABOLIC PANEL - Abnormal; Notable for the following:    Chloride 93 (*)    CO2 37 (*)    GFR calc non Af Amer 83 (*)    All other components  within normal limits  URINALYSIS, ROUTINE W REFLEX MICROSCOPIC - Abnormal; Notable for the following:    APPearance TURBID (*)    pH 8.5 (*)    Glucose, UA 100 (*)    Hgb urine dipstick TRACE (*)    Protein, ur >300 (*)    Leukocytes, UA SMALL (*)    All other components within normal limits  URINE MICROSCOPIC-ADD ON - Abnormal; Notable for the following:    Bacteria, UA MANY (*)    Crystals TRIPLE PHOSPHATE CRYSTALS (*)  All other components within normal limits  URINALYSIS, ROUTINE W REFLEX MICROSCOPIC - Abnormal; Notable for the following:    APPearance CLOUDY (*)    Hgb urine dipstick LARGE (*)    Protein, ur >300 (*)    Nitrite POSITIVE (*)    Leukocytes, UA SMALL (*)    All other components within normal limits  URINE MICROSCOPIC-ADD ON - Abnormal; Notable for the following:    Bacteria, UA MANY (*)    Crystals TRIPLE PHOSPHATE CRYSTALS (*)    All other components within normal limits  URINE CULTURE  URINE CULTURE   No results found.   1. Catheter (urine) change required   2. Complication, suprapubic catheter obstruction, initial encounter       MDM  Pt comes in with cc of urinary retention. Pt appears to have an obstructed suprapubic catheter. We tried to dislodge the clot and to lyse the clot - however, those efforts didn't yield any results - so we changed the suprapubic catheter. I cleaned the site prior to catheter insertion and used sterile technique. Pt advised to f.u with his Urologist and infection warning signs were given.   Derwood Kaplan, MD 04/11/12 0003

## 2012-04-10 NOTE — ED Notes (Signed)
Heparin was injected into pt cath a attempt with unclog pt catheter. There was resistance when attempting to flush.

## 2012-04-10 NOTE — ED Notes (Signed)
Per patient, cath clogged on and off since yesterday-clogged with white sediment-thinks its an infection-urologist sent them here

## 2012-04-14 LAB — URINE CULTURE

## 2012-04-15 ENCOUNTER — Telehealth (HOSPITAL_COMMUNITY): Payer: Self-pay | Admitting: Emergency Medicine

## 2012-04-15 LAB — URINE CULTURE

## 2012-04-15 NOTE — ED Notes (Signed)
Patient has +Urine culture. Checking to see if appropriately treated. °

## 2012-04-15 NOTE — ED Notes (Signed)
+  Urine. Patient given Keflex. Resistant. Chart sent to EDP office for review. °

## 2012-04-17 NOTE — ED Notes (Signed)
Chart returned from EDP office.Stop Kelfex: Start Cipro 500 mg BID x 7 days per Roxy Horseman.

## 2012-04-18 ENCOUNTER — Telehealth (HOSPITAL_COMMUNITY): Payer: Self-pay | Admitting: Emergency Medicine

## 2012-04-18 NOTE — ED Notes (Signed)
Pt returned call.  Pt informed of dx & need for addl tx.  Rx called to [facility] to RPh [phone].

## 2012-05-04 ENCOUNTER — Ambulatory Visit: Payer: Self-pay | Admitting: Cardiovascular Disease

## 2012-05-04 DIAGNOSIS — Z7901 Long term (current) use of anticoagulants: Secondary | ICD-10-CM | POA: Insufficient documentation

## 2012-05-04 DIAGNOSIS — I4892 Unspecified atrial flutter: Secondary | ICD-10-CM

## 2012-07-06 ENCOUNTER — Ambulatory Visit (INDEPENDENT_AMBULATORY_CARE_PROVIDER_SITE_OTHER): Payer: Self-pay | Admitting: Pharmacist Clinician (PhC)/ Clinical Pharmacy Specialist

## 2012-07-06 DIAGNOSIS — Z7901 Long term (current) use of anticoagulants: Secondary | ICD-10-CM

## 2012-07-06 DIAGNOSIS — I4892 Unspecified atrial flutter: Secondary | ICD-10-CM

## 2012-08-08 ENCOUNTER — Ambulatory Visit (INDEPENDENT_AMBULATORY_CARE_PROVIDER_SITE_OTHER): Payer: Self-pay | Admitting: Pharmacist Clinician (PhC)/ Clinical Pharmacy Specialist

## 2012-08-08 DIAGNOSIS — Z7901 Long term (current) use of anticoagulants: Secondary | ICD-10-CM

## 2012-08-08 DIAGNOSIS — I4892 Unspecified atrial flutter: Secondary | ICD-10-CM

## 2012-08-29 ENCOUNTER — Ambulatory Visit (INDEPENDENT_AMBULATORY_CARE_PROVIDER_SITE_OTHER): Payer: Self-pay | Admitting: Pharmacist Clinician (PhC)/ Clinical Pharmacy Specialist

## 2012-08-29 ENCOUNTER — Telehealth: Payer: Self-pay | Admitting: Cardiovascular Disease

## 2012-08-29 DIAGNOSIS — Z7901 Long term (current) use of anticoagulants: Secondary | ICD-10-CM

## 2012-08-29 DIAGNOSIS — I4892 Unspecified atrial flutter: Secondary | ICD-10-CM

## 2012-08-29 NOTE — Telephone Encounter (Signed)
See anticoagulation encounter

## 2012-08-29 NOTE — Telephone Encounter (Signed)
Checked blood count and its 1.9 and the only issue he had is he is getting a little bit of blood from the incision but its not bad .Marland KitchenPlease call   Thanks

## 2012-09-14 LAB — POCT INR: INR: 1.6

## 2012-09-17 ENCOUNTER — Ambulatory Visit (INDEPENDENT_AMBULATORY_CARE_PROVIDER_SITE_OTHER): Payer: Self-pay | Admitting: Pharmacist

## 2012-09-17 DIAGNOSIS — I4892 Unspecified atrial flutter: Secondary | ICD-10-CM

## 2012-09-17 DIAGNOSIS — Z7901 Long term (current) use of anticoagulants: Secondary | ICD-10-CM

## 2012-09-24 ENCOUNTER — Other Ambulatory Visit: Payer: Self-pay | Admitting: Cardiovascular Disease

## 2012-09-26 ENCOUNTER — Ambulatory Visit: Payer: Self-pay | Admitting: Pharmacist Clinician (PhC)/ Clinical Pharmacy Specialist

## 2012-09-26 DIAGNOSIS — Z7901 Long term (current) use of anticoagulants: Secondary | ICD-10-CM

## 2012-09-26 DIAGNOSIS — I4892 Unspecified atrial flutter: Secondary | ICD-10-CM

## 2012-10-08 ENCOUNTER — Ambulatory Visit (INDEPENDENT_AMBULATORY_CARE_PROVIDER_SITE_OTHER): Payer: Self-pay | Admitting: Pharmacist Clinician (PhC)/ Clinical Pharmacy Specialist

## 2012-10-08 DIAGNOSIS — I4892 Unspecified atrial flutter: Secondary | ICD-10-CM

## 2012-10-08 DIAGNOSIS — Z7901 Long term (current) use of anticoagulants: Secondary | ICD-10-CM

## 2012-10-08 LAB — POCT INR: INR: 1.5

## 2012-10-23 ENCOUNTER — Ambulatory Visit (INDEPENDENT_AMBULATORY_CARE_PROVIDER_SITE_OTHER): Payer: Medicare Other | Admitting: Pharmacist Clinician (PhC)/ Clinical Pharmacy Specialist

## 2012-10-23 DIAGNOSIS — Z7901 Long term (current) use of anticoagulants: Secondary | ICD-10-CM

## 2012-10-23 DIAGNOSIS — I4892 Unspecified atrial flutter: Secondary | ICD-10-CM

## 2012-11-02 ENCOUNTER — Other Ambulatory Visit: Payer: Self-pay | Admitting: Dermatology

## 2012-11-04 ENCOUNTER — Encounter: Payer: Self-pay | Admitting: *Deleted

## 2012-11-09 ENCOUNTER — Encounter: Payer: Medicare Other | Admitting: Cardiovascular Disease

## 2012-11-10 ENCOUNTER — Encounter: Payer: Self-pay | Admitting: *Deleted

## 2012-11-14 LAB — POCT INR: INR: 1.7

## 2012-11-15 ENCOUNTER — Ambulatory Visit: Payer: Medicare Other | Admitting: Pharmacist Clinician (PhC)/ Clinical Pharmacy Specialist

## 2012-11-15 DIAGNOSIS — I4892 Unspecified atrial flutter: Secondary | ICD-10-CM

## 2012-11-15 DIAGNOSIS — Z7901 Long term (current) use of anticoagulants: Secondary | ICD-10-CM

## 2012-12-05 ENCOUNTER — Ambulatory Visit (INDEPENDENT_AMBULATORY_CARE_PROVIDER_SITE_OTHER): Payer: Medicare Other | Admitting: Pharmacist Clinician (PhC)/ Clinical Pharmacy Specialist

## 2012-12-05 DIAGNOSIS — I4892 Unspecified atrial flutter: Secondary | ICD-10-CM

## 2012-12-05 DIAGNOSIS — Z7901 Long term (current) use of anticoagulants: Secondary | ICD-10-CM

## 2012-12-05 LAB — POCT INR: INR: 2

## 2012-12-06 ENCOUNTER — Encounter: Payer: Self-pay | Admitting: Cardiovascular Disease

## 2012-12-20 ENCOUNTER — Encounter: Payer: Self-pay | Admitting: Cardiovascular Disease

## 2012-12-20 ENCOUNTER — Ambulatory Visit (INDEPENDENT_AMBULATORY_CARE_PROVIDER_SITE_OTHER): Payer: Medicare Other | Admitting: Cardiovascular Disease

## 2012-12-20 VITALS — BP 108/64 | HR 70 | Resp 16 | Ht 66.0 in | Wt 204.6 lb

## 2012-12-20 DIAGNOSIS — R5381 Other malaise: Secondary | ICD-10-CM

## 2012-12-20 DIAGNOSIS — G4730 Sleep apnea, unspecified: Secondary | ICD-10-CM

## 2012-12-20 DIAGNOSIS — I4892 Unspecified atrial flutter: Secondary | ICD-10-CM

## 2012-12-20 DIAGNOSIS — E782 Mixed hyperlipidemia: Secondary | ICD-10-CM

## 2012-12-20 DIAGNOSIS — E785 Hyperlipidemia, unspecified: Secondary | ICD-10-CM

## 2012-12-20 DIAGNOSIS — I2510 Atherosclerotic heart disease of native coronary artery without angina pectoris: Secondary | ICD-10-CM

## 2012-12-20 DIAGNOSIS — Z79899 Other long term (current) drug therapy: Secondary | ICD-10-CM

## 2012-12-20 DIAGNOSIS — R509 Fever, unspecified: Secondary | ICD-10-CM

## 2012-12-20 DIAGNOSIS — Z950 Presence of cardiac pacemaker: Secondary | ICD-10-CM

## 2012-12-20 LAB — CBC
Hemoglobin: 16.6 g/dL (ref 13.0–17.0)
MCHC: 33.7 g/dL (ref 30.0–36.0)
RDW: 14.7 % (ref 11.5–15.5)
WBC: 6.7 10*3/uL (ref 4.0–10.5)

## 2012-12-20 LAB — COMPREHENSIVE METABOLIC PANEL
AST: 20 U/L (ref 0–37)
Albumin: 3.9 g/dL (ref 3.5–5.2)
Alkaline Phosphatase: 51 U/L (ref 39–117)
Potassium: 4.8 mEq/L (ref 3.5–5.3)
Sodium: 139 mEq/L (ref 135–145)
Total Protein: 6.4 g/dL (ref 6.0–8.3)

## 2012-12-20 LAB — PACEMAKER DEVICE OBSERVATION

## 2012-12-20 LAB — LIPID PANEL: LDL Cholesterol: 48 mg/dL (ref 0–99)

## 2012-12-20 NOTE — Patient Instructions (Addendum)
Remote monitoring is used to monitor your pacemaker from home. This monitoring reduces the number of office visits required to check your device to one time per year. It allows [place] to keep an eye on the functioning of your device to ensure it is working properly. You are scheduled for a device check from home on 03-25-2013. You may send your transmission at any time that day. If you have a wireless device, the transmission will be sent automatically. After your physician reviews your transmission, you will receive a postcard with your next transmission date.  Your physician recommends that you schedule a follow-up appointment in: 12 months.  Have Lab work done one morning before you have had anything to eat or drink.

## 2012-12-21 LAB — URINALYSIS
Nitrite: NEGATIVE
Specific Gravity, Urine: 1.024 (ref 1.005–1.030)
Urobilinogen, UA: 1 mg/dL (ref 0.0–1.0)

## 2012-12-21 LAB — URINE CULTURE: Colony Count: >=100000

## 2012-12-23 ENCOUNTER — Encounter: Payer: Self-pay | Admitting: Cardiovascular Disease

## 2012-12-23 DIAGNOSIS — Z950 Presence of cardiac pacemaker: Secondary | ICD-10-CM | POA: Insufficient documentation

## 2012-12-23 NOTE — Progress Notes (Signed)
Patient ID: Howard Torres, male   DOB: 1935-03-31, 77 y.o.   MRN: [id number]      Reason for office visit Atrial fibrillation, pulmonary hypertension, pacemaker check  Howard Torres presents for followup on his single chamber permanent pacemaker implant in 2011 for atrial fibrillation with slow ventricular response.   He has long-standing respiratory problems secondary to COPD and obstructive sleep apnea and has moderate pulmonary artery hypertension with an estimated systolic PA pressure of around 50 mm of mercury. He has previously had problems with edema which we have attributed to cor pulmonale, but currently this is not a major complaint .   He does not have known coronary artery disease and had a normal nuclear stress test in 2010. Because he was complaining of intermittent chest pain, we had scheduled him for another study in December of 2013 which he canceled. He has preserved left ventricular systolic function.  His complaints center around his suprapubic urinary catheter. He complains of this being always infected. The catheter sometimes clogs and needs to be flushed. The urine is turbid and sometimes foul-smelling. He has been told that his infection cannot be treated "except with intravenous antibiotics". He tells me that he was told these can only be administered at the hospital and that "he was not sick enough". He feels that he is not getting sufficient treatment for this problem. I suspect that he was actually told that he has urine that is colonized with a resistant bug and that treatment with antibiotics would not be beneficial.  He has chronic shortness of breath and does not appear to be much changed. He denies any further problems with chest pain. He has occasional diaphoresis and feels tired all the time. He sounds quite unhappy and depressed. He is very inactive.  He is on chronic anticoagulation with warfarin followed in our Coumadin clinic. He does not have a history of known  cardioembolic events and has not had any bleeding problems.  His pacemaker is a single-chamber Medtronic Adapta implanted in October 2011 and programmed VVIR. He paces roughly 50% of the time. No significant ventricular high rate episodes are recorded. He is not receiving any rate control medications   No Known Allergies  Current Outpatient Prescriptions  Medication Sig Dispense Refill  . albuterol (PROVENTIL HFA;VENTOLIN HFA) 108 (90 BASE) MCG/ACT inhaler Inhale 2 puffs into the lungs every 6 (six) hours as needed for wheezing or shortness of breath.      . alprazolam (XANAX) 2 MG tablet Take 2 mg by mouth 4 (four) times daily as needed.      Marland Kitchen aspirin 81 MG chewable tablet Chew 81 mg by mouth daily.      . cyanocobalamin (,VITAMIN B-12,) 1000 MCG/ML injection Inject 1,000 mcg into the muscle every 30 (thirty) days.      . fluorouracil (EFUDEX) 5 % cream Apply 1 application topically 2 (two) times daily.      . furosemide (LASIX) 20 MG tablet Take 20 mg by mouth daily as needed (swelling).       Marland Kitchen ketoconazole (NIZORAL) 2 % shampoo Apply 1 application topically every other day.       . lidocaine (LIDODERM) 5 % Place 1 patch onto the skin. Remove & Discard patch within 12 hours or as directed by MD.  Apply up to 3 patches per day to various sites.      . mometasone (NASONEX) 50 MCG/ACT nasal spray Place 2 sprays into the nose daily.      Marland Kitchen  omeprazole (PRILOSEC) 20 MG capsule Take 20 mg by mouth daily.      Marland Kitchen oxyCODONE-acetaminophen (PERCOCET) 10-325 MG per tablet Take 1 tablet by mouth every 6 (six) hours as needed for pain.       . potassium chloride (K-DUR,KLOR-CON) 10 MEQ tablet Take 10 mEq by mouth daily.      . rosuvastatin (CRESTOR) 20 MG tablet Take 20 mg by mouth daily.      . sodium chloride (OCEAN) 0.65 % nasal spray Place 1 spray into the nose as needed for congestion.      . Vitamin D, Ergocalciferol, (DRISDOL) 50000 UNITS CAPS Take 50,000 Units by mouth every 7 (seven) days.  Wednesdays.      Marland Kitchen warfarin (COUMADIN) 5 MG tablet TAKE AS DIRECTED  50 tablet  3   No current facility-administered medications for this visit.    Past Medical History  Diagnosis Date  . COPD (chronic obstructive pulmonary disease)   . Pacemaker   . GERD (gastroesophageal reflux disease)   . CHF (congestive heart failure)   . OSA (obstructive sleep apnea)   . Permanent atrial fibrillation   . Pulmonary hypertension   . Chest pain syndrome   . Dyslipidemia     Past Surgical History  Procedure Laterality Date  . Prostate surgery    . Hernia repair    . Nm myocar perf wall motion  11/25/2008    normal  . Permanent pacemaker insertion  11/16/2009    medtronic    No family history pertinent to his medical problems.  History   Social History  . Marital Status: Divorced    Spouse Name: N/A    Number of Children: N/A  . Years of Education: N/A   Occupational History  . Not on file.   Social History Main Topics  . Smoking status: Current Every Day Smoker  . Smokeless tobacco: Never Used  . Alcohol Use: No  . Drug Use: No  . Sexual Activity: No   Other Topics Concern  . Not on file   Social History Narrative  . No narrative on file    Review of systems: He has unchanged shortness of breath with exertion class 2-3. He is very sedentary. He complains of intermittent unexplained diaphoresis but no clear-cut fever or chills. He is very troubled by his suprapubic catheter which reportedly clogs.  The patient specifically denies any chest pain at rest or with exertion,  orthopnea, paroxysmal nocturnal dyspnea, syncope, palpitations, focal neurological deficits, intermittent claudication, lower extremity edema, unexplained weight gain, cough, hemoptysis or wheezing.  The patient also denies abdominal pain, nausea, vomiting, dysphagia, diarrhea, constipation, polydipsia, dysuria, hematuria,, abnormal bleeding or bruising, fever, chills, unexpected weight changes, mood  swings, change in skin or hair texture, change in voice quality, auditory or visual problems, allergic reactions or rashes, new musculoskeletal complaints other than usual "aches and pains".   PHYSICAL EXAM BP 108/64  Pulse 70  Resp 16  Ht 5\' 6"  (1.676 m)  Wt 204 lb 9.6 oz (92.806 kg)  BMI 33.04 kg/m2  General: Alert, oriented x3, no distress Head: no evidence of trauma, PERRL, EOMI, no exophtalmos or lid lag, no myxedema, no xanthelasma; normal ears, nose and oropharynx Neck: normal jugular venous pulsations and no hepatojugular reflux; brisk carotid pulses without delay and no carotid bruits Chest: clear to auscultation, no signs of consolidation by percussion or palpation, normal fremitus, symmetrical and full respiratory excursions; L2 left subclavian pacemaker site Cardiovascular: normal position and quality of  the apical impulse, regular rhythm, normal first and paradoxically split second heart sounds, 2/6 low systolic left lower sternal border murmur, no rubs or gallops Abdomen: no tenderness or distention, no masses by palpation, no abnormal pulsatility or arterial bruits, normal bowel sounds, no hepatosplenomegaly Extremities: no clubbing, cyanosis or edema; 2+ radial, ulnar and brachial pulses bilaterally; 2+ right femoral, posterior tibial and dorsalis pedis pulses; 2+ left femoral, posterior tibial and dorsalis pedis pulses; no subclavian or femoral bruits Neurological: grossly nonfocal   EKG: Atrial fibrillation, ventricular pacing  Lipid Panel     Component Value Date/Time   CHOL 107 12/20/2012 1629   TRIG 96 12/20/2012 1629   HDL 40 12/20/2012 1629   CHOLHDL 2.7 12/20/2012 1629   VLDL 19 12/20/2012 1629   LDLCALC 48 12/20/2012 1629    BMET    Component Value Date/Time   NA 139 12/20/2012 1629   K 4.8 12/20/2012 1629   CL 100 12/20/2012 1629   CO2 35* 12/20/2012 1629   GLUCOSE 78 12/20/2012 1629   BUN 12 12/20/2012 1629   CREATININE 1.05 12/20/2012 1629   CREATININE 0.86  04/10/2012 2010   CALCIUM 9.2 12/20/2012 1629   GFRNONAA 83* 04/10/2012 2010   GFRAA >90 04/10/2012 2010     ASSESSMENT AND PLAN ATRIAL FLUTTER He appears to have rather coarse atrial fibrillation, sometimes atrial flutter with slow ventricular response. He is on appropriate anticoagulation therapy without bleeding or thrombotic complications. No changes were made to his medications.  Pacemaker Normal function of a single-chamber Medtronic pacemaker. He is well suited for remote device monitoring.  HYPERLIPIDEMIA I think he is due a lipid profile was routine chemistry tests. We'll take this opportunity to check a CBC to see if there is any evidence of a systemic infection and also check urine culture and blood culture just in case his episodes of diaphoresis are related to infection.   Patient Instructions  Remote monitoring is used to monitor your pacemaker from home. This monitoring reduces the number of office visits required to check your device to one time per year. It allows [place] to keep an eye on the functioning of your device to ensure it is working properly. You are scheduled for a device check from home on 03-25-2013. You may send your transmission at any time that day. If you have a wireless device, the transmission will be sent automatically. After your physician reviews your transmission, you will receive a postcard with your next transmission date.  Your physician recommends that you schedule a follow-up appointment in: 12 months.  Have Lab work done one morning before you have had anything to eat or drink.        Orders Placed This Encounter  Procedures  . Blood culture (routine single)  . Urine Culture  . CBC  . Comp Met (CMET)  . Lipid Profile  . Urinalysis  . EKG 12-Lead    Manahil Vanzile  Thurmon Fair, MD, [facility] [phone] office [phone] pager

## 2012-12-23 NOTE — Assessment & Plan Note (Signed)
Normal function of a single-chamber Medtronic pacemaker. He is well suited for remote device monitoring.

## 2012-12-23 NOTE — Assessment & Plan Note (Signed)
He appears to have rather coarse atrial fibrillation, sometimes atrial flutter with slow ventricular response. He is on appropriate anticoagulation therapy without bleeding or thrombotic complications. No changes were made to his medications.

## 2012-12-23 NOTE — Assessment & Plan Note (Signed)
I think he is due a lipid profile was routine chemistry tests. We'll take this opportunity to check a CBC to see if there is any evidence of a systemic infection and also check urine culture and blood culture just in case his episodes of diaphoresis are related to infection.

## 2012-12-26 ENCOUNTER — Encounter: Payer: Self-pay | Admitting: *Deleted

## 2012-12-26 LAB — MDC_IDC_ENUM_SESS_TYPE_INCLINIC
Battery Impedance: 520 Ohm
Brady Statistic RV Percent Paced: 54 %
Lead Channel Pacing Threshold Pulse Width: 0.4 ms
Lead Channel Setting Pacing Amplitude: 2 V
Lead Channel Setting Sensing Sensitivity: 2 mV

## 2012-12-26 LAB — CULTURE, BLOOD (SINGLE): Organism ID, Bacteria: NO GROWTH

## 2012-12-28 ENCOUNTER — Ambulatory Visit (INDEPENDENT_AMBULATORY_CARE_PROVIDER_SITE_OTHER): Payer: Medicare Other | Admitting: Pharmacist Clinician (PhC)/ Clinical Pharmacy Specialist

## 2012-12-28 DIAGNOSIS — I4892 Unspecified atrial flutter: Secondary | ICD-10-CM

## 2012-12-28 DIAGNOSIS — Z7901 Long term (current) use of anticoagulants: Secondary | ICD-10-CM

## 2013-01-30 ENCOUNTER — Ambulatory Visit (INDEPENDENT_AMBULATORY_CARE_PROVIDER_SITE_OTHER): Payer: Medicare Other | Admitting: Pharmacist Clinician (PhC)/ Clinical Pharmacy Specialist

## 2013-01-30 DIAGNOSIS — Z7901 Long term (current) use of anticoagulants: Secondary | ICD-10-CM

## 2013-01-30 DIAGNOSIS — I4892 Unspecified atrial flutter: Secondary | ICD-10-CM

## 2013-03-04 ENCOUNTER — Telehealth: Payer: Self-pay | Admitting: *Deleted

## 2013-03-04 ENCOUNTER — Ambulatory Visit (INDEPENDENT_AMBULATORY_CARE_PROVIDER_SITE_OTHER): Payer: Medicare Other | Admitting: Pharmacist Clinician (PhC)/ Clinical Pharmacy Specialist

## 2013-03-04 DIAGNOSIS — Z7901 Long term (current) use of anticoagulants: Secondary | ICD-10-CM

## 2013-03-04 DIAGNOSIS — I4892 Unspecified atrial flutter: Secondary | ICD-10-CM

## 2013-03-04 LAB — POCT INR: INR: 2.3

## 2013-03-04 NOTE — Telephone Encounter (Signed)
Pt states had large amount of rectal bleeding on January 1 x 3 epidoses.  INR was 4.7   [facility] was [facility] for 3 nights, was told to hold until 02-21-13.  Checked today was 2.3.  Concerned about being this high.  Advised to take 5mg  today then decrease to 7.5mg  twice weekly.  See anticoagulation note

## 2013-03-04 NOTE — Telephone Encounter (Signed)
Pt was calling in regards to his numbers INR was 2.3, but he wants to talk to you about being in the hospital.

## 2013-03-11 ENCOUNTER — Ambulatory Visit (INDEPENDENT_AMBULATORY_CARE_PROVIDER_SITE_OTHER): Payer: Medicare Other | Admitting: Pharmacist Clinician (PhC)/ Clinical Pharmacy Specialist

## 2013-03-11 ENCOUNTER — Telehealth: Payer: Self-pay | Admitting: *Deleted

## 2013-03-11 DIAGNOSIS — I4892 Unspecified atrial flutter: Secondary | ICD-10-CM

## 2013-03-11 DIAGNOSIS — Z7901 Long term (current) use of anticoagulants: Secondary | ICD-10-CM

## 2013-03-11 LAB — POCT INR: INR: 2.6

## 2013-03-11 NOTE — Telephone Encounter (Signed)
Pt was calling in INR 2.6

## 2013-03-11 NOTE — Telephone Encounter (Signed)
See anticoag note

## 2013-03-27 ENCOUNTER — Ambulatory Visit (INDEPENDENT_AMBULATORY_CARE_PROVIDER_SITE_OTHER): Payer: Medicare Other | Admitting: Pharmacist Clinician (PhC)/ Clinical Pharmacy Specialist

## 2013-03-27 DIAGNOSIS — I4892 Unspecified atrial flutter: Secondary | ICD-10-CM

## 2013-03-27 DIAGNOSIS — Z7901 Long term (current) use of anticoagulants: Secondary | ICD-10-CM

## 2013-03-27 LAB — POCT INR: INR: 1.6

## 2013-04-23 ENCOUNTER — Other Ambulatory Visit: Payer: Self-pay | Admitting: Pharmacist Clinician (PhC)/ Clinical Pharmacy Specialist

## 2013-04-23 MED ORDER — WARFARIN SODIUM 5 MG PO TABS: ORAL_TABLET | ORAL | Status: DC

## 2013-04-30 ENCOUNTER — Ambulatory Visit (INDEPENDENT_AMBULATORY_CARE_PROVIDER_SITE_OTHER): Payer: Medicare Other | Admitting: Pharmacist Clinician (PhC)/ Clinical Pharmacy Specialist

## 2013-04-30 DIAGNOSIS — I4892 Unspecified atrial flutter: Secondary | ICD-10-CM

## 2013-04-30 DIAGNOSIS — Z7901 Long term (current) use of anticoagulants: Secondary | ICD-10-CM

## 2013-04-30 LAB — PROTIME-INR: INR: 1.7 — AB (ref 0.9–1.1)

## 2013-05-30 LAB — POCT INR: INR: 1.7

## 2013-05-31 ENCOUNTER — Ambulatory Visit (INDEPENDENT_AMBULATORY_CARE_PROVIDER_SITE_OTHER): Payer: Medicare Other | Admitting: Pharmacist Clinician (PhC)/ Clinical Pharmacy Specialist

## 2013-05-31 DIAGNOSIS — Z7901 Long term (current) use of anticoagulants: Secondary | ICD-10-CM

## 2013-05-31 DIAGNOSIS — I4892 Unspecified atrial flutter: Secondary | ICD-10-CM

## 2013-06-26 ENCOUNTER — Encounter: Payer: Self-pay | Admitting: *Deleted

## 2013-07-01 LAB — POCT INR: INR: 2.7

## 2013-07-02 ENCOUNTER — Ambulatory Visit (INDEPENDENT_AMBULATORY_CARE_PROVIDER_SITE_OTHER): Payer: Medicare Other | Admitting: Pharmacist Clinician (PhC)/ Clinical Pharmacy Specialist

## 2013-07-02 DIAGNOSIS — Z7901 Long term (current) use of anticoagulants: Secondary | ICD-10-CM

## 2013-07-02 DIAGNOSIS — I4892 Unspecified atrial flutter: Secondary | ICD-10-CM

## 2013-07-03 ENCOUNTER — Other Ambulatory Visit: Payer: Self-pay | Admitting: Pharmacist Clinician (PhC)/ Clinical Pharmacy Specialist

## 2013-07-03 MED ORDER — NITROGLYCERIN 0.4 MG SL SUBL: 0.4000 mg | SUBLINGUAL_TABLET | SUBLINGUAL | Status: DC | PRN

## 2013-07-03 NOTE — Telephone Encounter (Signed)
Called pt with INR info, stated he was out of nitroglycerine tablets, bottle lasted him a "couple of years".  Will send new rx to pharmacy

## 2013-07-16 ENCOUNTER — Ambulatory Visit (INDEPENDENT_AMBULATORY_CARE_PROVIDER_SITE_OTHER): Payer: Medicare Other | Admitting: Pharmacist Clinician (PhC)/ Clinical Pharmacy Specialist

## 2013-07-16 DIAGNOSIS — Z7901 Long term (current) use of anticoagulants: Secondary | ICD-10-CM

## 2013-07-16 DIAGNOSIS — I4892 Unspecified atrial flutter: Secondary | ICD-10-CM

## 2013-07-16 LAB — POCT INR: INR: 2.1

## 2013-07-22 ENCOUNTER — Encounter: Payer: Self-pay | Admitting: Cardiovascular Disease

## 2013-07-22 ENCOUNTER — Ambulatory Visit (INDEPENDENT_AMBULATORY_CARE_PROVIDER_SITE_OTHER): Payer: Medicare Other | Admitting: Cardiovascular Disease

## 2013-07-22 VITALS — BP 162/68 | HR 82 | Resp 16 | Ht 65.0 in | Wt 204.4 lb

## 2013-07-22 DIAGNOSIS — R0602 Shortness of breath: Secondary | ICD-10-CM

## 2013-07-22 DIAGNOSIS — Z950 Presence of cardiac pacemaker: Secondary | ICD-10-CM

## 2013-07-22 DIAGNOSIS — I279 Pulmonary heart disease, unspecified: Secondary | ICD-10-CM

## 2013-07-22 DIAGNOSIS — E785 Hyperlipidemia, unspecified: Secondary | ICD-10-CM

## 2013-07-22 DIAGNOSIS — I4892 Unspecified atrial flutter: Secondary | ICD-10-CM

## 2013-07-22 LAB — MDC_IDC_ENUM_SESS_TYPE_INCLINIC
Battery Impedance: 669 Ohm
Brady Statistic RV Percent Paced: 89 %
Date Time Interrogation Session: 20150608120646
Lead Channel Impedance Value: 0 Ohm
Lead Channel Impedance Value: 590 Ohm
Lead Channel Setting Pacing Amplitude: 2.5 V
Lead Channel Setting Pacing Pulse Width: 0.4 ms
Lead Channel Setting Sensing Sensitivity: 2 mV
MDC IDC MSMT BATTERY REMAINING LONGEVITY: 74 mo
MDC IDC MSMT BATTERY VOLTAGE: 2.78 V
MDC IDC MSMT LEADCHNL RV PACING THRESHOLD AMPLITUDE: 0.5 V
MDC IDC MSMT LEADCHNL RV PACING THRESHOLD PULSEWIDTH: 0.4 ms
MDC IDC MSMT LEADCHNL RV SENSING INTR AMPL: 8 mV

## 2013-07-22 LAB — PACEMAKER DEVICE OBSERVATION

## 2013-07-22 NOTE — Patient Instructions (Addendum)
Remote monitoring is used to monitor your pacemaker from home. This monitoring reduces the number of office visits required to check your device to one time per year. It allows [place] to keep an eye on the functioning of your device to ensure it is working properly. You are scheduled for a device check from home on 10-23-2013. You may send your transmission at any time that day. If you have a wireless device, the transmission will be sent automatically. After your physician reviews your transmission, you will receive a postcard with your next transmission date.  Your physician has requested that you have an echocardiogram. Echocardiography is a painless test that uses sound waves to create images of your heart. It provides your doctor with information about the size and shape of your heart and how well your heart's chambers and valves are working. This procedure takes approximately one hour. There are no restrictions for this procedure.   Your physician recommends that you schedule a follow-up appointment in: 12 months with Dr.Croitoru

## 2013-07-24 ENCOUNTER — Encounter: Payer: Self-pay | Admitting: Pharmacist Clinician (PhC)/ Clinical Pharmacy Specialist

## 2013-07-24 DIAGNOSIS — I4892 Unspecified atrial flutter: Secondary | ICD-10-CM

## 2013-07-24 DIAGNOSIS — Z7901 Long term (current) use of anticoagulants: Secondary | ICD-10-CM

## 2013-07-28 ENCOUNTER — Encounter: Payer: Self-pay | Admitting: Cardiovascular Disease

## 2013-07-28 NOTE — Assessment & Plan Note (Signed)
Is is probably contributing to his lower extremity edema, but he also has very prominent varicose veins. Have told him to watch for development of nonhealing ulcers.

## 2013-07-28 NOTE — Assessment & Plan Note (Signed)
Comprehensive interrogation of his pacemaker shows normal function. There was no need for permanent reprogramming changes.

## 2013-07-28 NOTE — Assessment & Plan Note (Signed)
Excellent lipid parameters on labs performed last November.

## 2013-07-28 NOTE — Assessment & Plan Note (Signed)
Continues to have very coarse atrial fibrillation, possibly atypical atrial flutter. He is therapeutically anticoagulated without any embolic or bleeding complications. Despite not being on rate control medications his ventricular pacing frequency has increased and almost 90%. This does not appear to have led to worsening signs or symptoms of heart failure.

## 2013-07-28 NOTE — Progress Notes (Signed)
Patient ID: Howard Torres, male   DOB: 06-27-1935, 78 y.o.   MRN: [id number]      Reason for office visit Chest pain, Atrial fibrillation, pulmonary hypertension, pacemaker check  Howard Torres complains of intermittent "flash like" episodes of chest discomfort that travels left to right across his chest. They occur at rest. He also complains of lower extremity edema that waxes and wanes in severity. He has a small scab on the anterior right shin. It is healing very slowly. There has been no change in his chronic pattern of dyspnea.  Pacemaker interrogation shows increase in ventricular pacing to 89% (previously around 50%).  Howard Torres has single chamber Medtronic Adapta permanent pacemaker implant in 2011 for atrial fibrillation with slow ventricular response.  He has long-standing respiratory problems secondary to COPD and obstructive sleep apnea and has moderate pulmonary artery hypertension with an estimated systolic PA pressure of around 50 mm of mercury. He has previously had problems with edema which we have attributed to cor pulmonale, but currently this is not a major complaint .  He does not have known coronary artery disease and had a normal nuclear stress test in 2010. Because he was complaining of intermittent chest pain, we had scheduled him for another study in December of 2013 which he canceled. He has preserved left ventricular systolic function.  He has a chronic suprapubic urinary catheter.  He sounds a little less unhappy and depressed than usual. He is very inactive.  He is on chronic anticoagulation with warfarin followed in our Coumadin clinic. He does not have a history of known cardioembolic events and has not had any bleeding problems.     No Known Allergies  Current Outpatient Prescriptions  Medication Sig Dispense Refill  . albuterol (PROVENTIL HFA;VENTOLIN HFA) 108 (90 BASE) MCG/ACT inhaler Inhale 2 puffs into the lungs every 6 (six) hours as needed for wheezing or  shortness of breath.      . alprazolam (XANAX) 2 MG tablet Take 2 mg by mouth 4 (four) times daily as needed.      Marland Kitchen aspirin 81 MG chewable tablet Chew 81 mg by mouth daily.      . cyanocobalamin (,VITAMIN B-12,) 1000 MCG/ML injection Inject 1,000 mcg into the muscle every 30 (thirty) days.      . fluorouracil (EFUDEX) 5 % cream Apply 1 application topically 2 (two) times daily.      . furosemide (LASIX) 20 MG tablet Take 20 mg by mouth daily as needed (swelling).       Marland Kitchen ketoconazole (NIZORAL) 2 % shampoo Apply 1 application topically every other day.       . lidocaine (LIDODERM) 5 % Place 1 patch onto the skin. Remove & Discard patch within 12 hours or as directed by MD.  Apply up to 3 patches per day to various sites.      . mometasone (NASONEX) 50 MCG/ACT nasal spray Place 2 sprays into the nose daily.      . nitroGLYCERIN (NITROSTAT) 0.4 MG SL tablet Place 1 tablet (0.4 mg total) under the tongue every 5 (five) minutes as needed for chest pain.  25 tablet  10  . omeprazole (PRILOSEC) 20 MG capsule Take 20 mg by mouth daily.      Marland Kitchen oxyCODONE-acetaminophen (PERCOCET) 10-325 MG per tablet Take 1 tablet by mouth every 6 (six) hours as needed for pain.       . potassium chloride (K-DUR,KLOR-CON) 10 MEQ tablet Take 10 mEq by mouth daily.      Marland Kitchen  rosuvastatin (CRESTOR) 20 MG tablet Take 20 mg by mouth daily.      . sodium chloride (OCEAN) 0.65 % nasal spray Place 1 spray into the nose as needed for congestion.      . Vitamin D, Ergocalciferol, (DRISDOL) 50000 UNITS CAPS Take 50,000 Units by mouth every 7 (seven) days. Wednesdays.      Marland Kitchen. warfarin (COUMADIN) 5 MG tablet Take 1 to 1 & 1/2 tablets by mouth daily as directed  45 tablet  5   No current facility-administered medications for this visit.    Past Medical History  Diagnosis Date  . COPD (chronic obstructive pulmonary disease)   . Pacemaker   . GERD (gastroesophageal reflux disease)   . CHF (congestive heart failure)   . OSA (obstructive  sleep apnea)   . Permanent atrial fibrillation   . Pulmonary hypertension   . Chest pain syndrome   . Dyslipidemia     Past Surgical History  Procedure Laterality Date  . Prostate surgery    . Hernia repair    . Nm myocar perf wall motion  11/25/2008    normal  . Permanent pacemaker insertion  11/16/2009    medtronic    No family history on file.  History   Social History  . Marital Status: Divorced    Spouse Name: N/A    Number of Children: N/A  . Years of Education: N/A   Occupational History  . Not on file.   Social History Main Topics  . Smoking status: Current Every Day Smoker  . Smokeless tobacco: Never Used  . Alcohol Use: No  . Drug Use: No  . Sexual Activity: No   Other Topics Concern  . Not on file   Social History Narrative  . No narrative on file    Review of systems: He has unchanged shortness of breath with exertion class 2-3. He is very sedentary. He complains of intermittent unexplained diaphoresis but no clear-cut fever or chills. He is very troubled by his suprapubic catheter which reportedly clogs.  The patient specifically denies any chest pain with exertion, orthopnea, paroxysmal nocturnal dyspnea, syncope, palpitations, focal neurological deficits, intermittent claudication, lower extremity edema, unexplained weight gain, hemoptysis or wheezing.  The patient also denies abdominal pain, nausea, vomiting, dysphagia, diarrhea, constipation, polydipsia, dysuria, hematuria,, abnormal bleeding or bruising, fever, chills, unexpected weight changes, mood swings, change in skin or hair texture, change in voice quality, auditory or visual problems, allergic reactions or rashes, new musculoskeletal complaints other than usual "aches and pains".      PHYSICAL EXAM BP 162/68  Pulse 82  Resp 16  Ht 5\' 5"  (1.651 m)  Wt 204 lb 6.4 oz (92.715 kg)  BMI 34.01 kg/m2 General: Alert, oriented x3, no distress  Head: no evidence of trauma, PERRL, EOMI, no  exophtalmos or lid lag, no myxedema, no xanthelasma; normal ears, nose and oropharynx  Neck: normal jugular venous pulsations and no hepatojugular reflux; brisk carotid pulses without delay and no carotid bruits  Chest: clear to auscultation, no signs of consolidation by percussion or palpation, normal fremitus, symmetrical and full respiratory excursions; L2 left subclavian pacemaker site  Cardiovascular: normal position and quality of the apical impulse, regular rhythm, normal first and paradoxically split second heart sounds, 2/6 low systolic left lower sternal border murmur, no rubs or gallops  Abdomen: no tenderness or distention, no masses by palpation, no abnormal pulsatility or arterial bruits, normal bowel sounds, no hepatosplenomegaly  Extremities: no clubbing, cyanosis or edema; 2+  radial, ulnar and brachial pulses bilaterally; 2+ right femoral, posterior tibial and dorsalis pedis pulses; 2+ left femoral, posterior tibial and dorsalis pedis pulses; no subclavian or femoral bruits  Neurological: grossly nonfocal    EKG: Atrial fibrillation, 100% ventricular pacing  Lipid Panel     Component Value Date/Time   CHOL 107 12/20/2012 1629   TRIG 96 12/20/2012 1629   HDL 40 12/20/2012 1629   CHOLHDL 2.7 12/20/2012 1629   VLDL 19 12/20/2012 1629   LDLCALC 48 12/20/2012 1629    BMET    Component Value Date/Time   NA 139 12/20/2012 1629   K 4.8 12/20/2012 1629   CL 100 12/20/2012 1629   CO2 35* 12/20/2012 1629   GLUCOSE 78 12/20/2012 1629   BUN 12 12/20/2012 1629   CREATININE 1.05 12/20/2012 1629   CREATININE 0.86 04/10/2012 2010   CALCIUM 9.2 12/20/2012 1629   GFRNONAA 83* 04/10/2012 2010   GFRAA >90 04/10/2012 2010     ASSESSMENT AND PLAN ATRIAL FLUTTER Continues to have very coarse atrial fibrillation, possibly atypical atrial flutter. He is therapeutically anticoagulated without any embolic or bleeding complications. Despite not being on rate control medications his ventricular pacing  frequency has increased and almost 90%. This does not appear to have led to worsening signs or symptoms of heart failure.  Pacemaker Comprehensive interrogation of his pacemaker shows normal function. There was no need for permanent reprogramming changes.  COR PULMONALE Is is probably contributing to his lower extremity edema, but he also has very prominent varicose veins. Have told him to watch for development of nonhealing ulcers.  HYPERLIPIDEMIA Excellent lipid parameters on labs performed last November.   Orders Placed This Encounter  Procedures  . Implantable device check  . EKG 12-Lead  . 2D Echocardiogram without contrast   No orders of the defined types were placed in this encounter.    Junious SilkROITORU,Ariah Mower  Ifrah Vest, MD, [facility] [phone] office [phone] pager

## 2013-07-29 ENCOUNTER — Ambulatory Visit (HOSPITAL_COMMUNITY)
Admission: RE | Admit: 2013-07-29 | Discharge: 2013-07-29 | Disposition: A | Discharge status: Home / Self Care | Payer: Medicare Other | Source: Ambulatory Visit | Attending: Cardiovascular Disease | Admitting: Cardiovascular Disease

## 2013-07-29 DIAGNOSIS — I369 Nonrheumatic tricuspid valve disorder, unspecified: Secondary | ICD-10-CM

## 2013-07-29 DIAGNOSIS — R0602 Shortness of breath: Secondary | ICD-10-CM

## 2013-07-29 DIAGNOSIS — I359 Nonrheumatic aortic valve disorder, unspecified: Secondary | ICD-10-CM | POA: Diagnosis present

## 2013-07-29 NOTE — Progress Notes (Signed)
2D Echo Performed 07/29/2013    Lydia Toren, RCS  

## 2013-08-12 ENCOUNTER — Ambulatory Visit (INDEPENDENT_AMBULATORY_CARE_PROVIDER_SITE_OTHER): Payer: Medicare Other | Admitting: Pharmacist Clinician (PhC)/ Clinical Pharmacy Specialist

## 2013-08-12 DIAGNOSIS — Z7901 Long term (current) use of anticoagulants: Secondary | ICD-10-CM

## 2013-08-12 LAB — POCT INR: INR: 1.6

## 2013-08-13 ENCOUNTER — Encounter: Payer: Self-pay | Admitting: *Deleted

## 2013-08-23 ENCOUNTER — Encounter: Payer: Self-pay | Admitting: Cardiovascular Disease

## 2013-08-23 ENCOUNTER — Telehealth: Payer: Self-pay | Admitting: *Deleted

## 2013-08-23 NOTE — Telephone Encounter (Signed)
Message copied by Vita BarleyLASSITER, Ynez Eugenio A on Fri Aug 23, 2013  8:35 AM ------      Message from: Thurmon FairROITORU, MIHAI      Created: Thu Aug 22, 2013  7:30 AM       Normal LV function, no valve problems ------

## 2013-08-23 NOTE — Progress Notes (Signed)
LM with echo results on home number. 

## 2013-08-23 NOTE — Telephone Encounter (Signed)
LM with echo results on home number.

## 2013-09-10 LAB — POCT INR: INR: 1.5

## 2013-09-11 ENCOUNTER — Ambulatory Visit (INDEPENDENT_AMBULATORY_CARE_PROVIDER_SITE_OTHER): Payer: Medicare Other | Admitting: Pharmacist Clinician (PhC)/ Clinical Pharmacy Specialist

## 2013-09-11 DIAGNOSIS — Z7901 Long term (current) use of anticoagulants: Secondary | ICD-10-CM

## 2013-09-26 LAB — POCT INR: INR: 1.7

## 2013-09-30 ENCOUNTER — Ambulatory Visit (INDEPENDENT_AMBULATORY_CARE_PROVIDER_SITE_OTHER): Payer: Medicare Other | Admitting: Pharmacist Clinician (PhC)/ Clinical Pharmacy Specialist

## 2013-09-30 DIAGNOSIS — Z7901 Long term (current) use of anticoagulants: Secondary | ICD-10-CM

## 2013-10-23 ENCOUNTER — Encounter: Payer: Medicare Other | Admitting: *Deleted

## 2013-10-23 ENCOUNTER — Telehealth: Payer: Self-pay | Admitting: Cardiology

## 2013-10-23 NOTE — Telephone Encounter (Signed)
LMOVM reminding pt to send remote transmission.   

## 2013-10-24 ENCOUNTER — Ambulatory Visit (INDEPENDENT_AMBULATORY_CARE_PROVIDER_SITE_OTHER): Payer: Medicare Other | Admitting: Pharmacist Clinician (PhC)/ Clinical Pharmacy Specialist

## 2013-10-24 DIAGNOSIS — Z7901 Long term (current) use of anticoagulants: Secondary | ICD-10-CM

## 2013-10-24 LAB — POCT INR: INR: 1.8

## 2013-10-25 ENCOUNTER — Encounter: Payer: Self-pay | Admitting: Cardiology

## 2013-11-06 ENCOUNTER — Encounter (INDEPENDENT_AMBULATORY_CARE_PROVIDER_SITE_OTHER): Payer: Medicare Other | Admitting: Ophthalmology

## 2013-11-06 DIAGNOSIS — H43819 Vitreous degeneration, unspecified eye: Secondary | ICD-10-CM

## 2013-11-06 DIAGNOSIS — H4310 Vitreous hemorrhage, unspecified eye: Secondary | ICD-10-CM

## 2013-11-07 ENCOUNTER — Encounter (HOSPITAL_COMMUNITY): Admission: RE | Payer: Self-pay | Source: Ambulatory Visit

## 2013-11-07 ENCOUNTER — Ambulatory Visit (HOSPITAL_COMMUNITY): Admission: RE | Admit: 2013-11-07 | Payer: Medicare Other | Source: Ambulatory Visit | Admitting: Ophthalmology

## 2013-11-07 ENCOUNTER — Telehealth: Payer: Self-pay | Admitting: *Deleted

## 2013-11-07 LAB — POCT INR: INR: 2.2

## 2013-11-07 SURGERY — SCLERAL BUCKLE
Anesthesia: General | Laterality: Left

## 2013-11-07 NOTE — Telephone Encounter (Signed)
Signed perioperative Rx for implanted cardiac device programming faxed to [facility] [phone].

## 2013-11-08 ENCOUNTER — Ambulatory Visit (INDEPENDENT_AMBULATORY_CARE_PROVIDER_SITE_OTHER): Payer: Medicare Other | Admitting: Pharmacist Clinician (PhC)/ Clinical Pharmacy Specialist

## 2013-11-08 DIAGNOSIS — Z7901 Long term (current) use of anticoagulants: Secondary | ICD-10-CM

## 2013-11-20 ENCOUNTER — Encounter (INDEPENDENT_AMBULATORY_CARE_PROVIDER_SITE_OTHER): Payer: Medicare Other | Admitting: Ophthalmology

## 2013-11-20 DIAGNOSIS — H35033 Hypertensive retinopathy, bilateral: Secondary | ICD-10-CM

## 2013-11-20 DIAGNOSIS — H4312 Vitreous hemorrhage, left eye: Secondary | ICD-10-CM

## 2013-11-20 DIAGNOSIS — I10 Essential (primary) hypertension: Secondary | ICD-10-CM

## 2013-11-21 ENCOUNTER — Encounter: Payer: Self-pay | Admitting: Cardiovascular Disease

## 2013-11-21 ENCOUNTER — Telehealth: Payer: Self-pay | Admitting: Internal Medicine

## 2013-11-21 LAB — POCT INR: INR: 1.8

## 2013-11-21 NOTE — Telephone Encounter (Signed)
11-21-13 SENT PAST DUE LETTER, NEEDS PACER CHECK WITH DR C/MT

## 2013-11-22 ENCOUNTER — Ambulatory Visit (INDEPENDENT_AMBULATORY_CARE_PROVIDER_SITE_OTHER): Payer: Medicare Other | Admitting: Pharmacist Clinician (PhC)/ Clinical Pharmacy Specialist

## 2013-12-02 ENCOUNTER — Ambulatory Visit (INDEPENDENT_AMBULATORY_CARE_PROVIDER_SITE_OTHER): Payer: Medicare Other | Admitting: Pharmacist Clinician (PhC)/ Clinical Pharmacy Specialist

## 2013-12-02 LAB — POCT INR: INR: 2

## 2013-12-13 ENCOUNTER — Ambulatory Visit (INDEPENDENT_AMBULATORY_CARE_PROVIDER_SITE_OTHER): Payer: Self-pay | Admitting: Pharmacist Clinician (PhC)/ Clinical Pharmacy Specialist

## 2013-12-13 DIAGNOSIS — I4892 Unspecified atrial flutter: Secondary | ICD-10-CM

## 2013-12-13 LAB — POCT INR: INR: 2.3

## 2013-12-18 ENCOUNTER — Encounter (INDEPENDENT_AMBULATORY_CARE_PROVIDER_SITE_OTHER): Payer: Medicare Other | Admitting: Ophthalmology

## 2013-12-18 DIAGNOSIS — H4312 Vitreous hemorrhage, left eye: Secondary | ICD-10-CM

## 2013-12-18 DIAGNOSIS — H43813 Vitreous degeneration, bilateral: Secondary | ICD-10-CM

## 2013-12-18 DIAGNOSIS — H35033 Hypertensive retinopathy, bilateral: Secondary | ICD-10-CM

## 2013-12-18 DIAGNOSIS — I10 Essential (primary) hypertension: Secondary | ICD-10-CM

## 2013-12-23 ENCOUNTER — Ambulatory Visit (INDEPENDENT_AMBULATORY_CARE_PROVIDER_SITE_OTHER): Payer: Self-pay | Admitting: Pharmacist Clinician (PhC)/ Clinical Pharmacy Specialist

## 2013-12-23 DIAGNOSIS — I4892 Unspecified atrial flutter: Secondary | ICD-10-CM

## 2013-12-23 LAB — POCT INR: INR: 2

## 2013-12-24 ENCOUNTER — Encounter: Payer: Self-pay | Admitting: Cardiology

## 2014-01-13 ENCOUNTER — Ambulatory Visit (INDEPENDENT_AMBULATORY_CARE_PROVIDER_SITE_OTHER): Payer: Medicare Other | Admitting: Pharmacist Clinician (PhC)/ Clinical Pharmacy Specialist

## 2014-01-13 LAB — POCT INR: INR: 1.7

## 2014-01-14 LAB — PROTIME-INR: INR: 1.7 — AB (ref 0.9–1.1)

## 2014-01-29 ENCOUNTER — Other Ambulatory Visit: Payer: Self-pay | Admitting: Pharmacist Clinician (PhC)/ Clinical Pharmacy Specialist

## 2014-02-10 ENCOUNTER — Ambulatory Visit (INDEPENDENT_AMBULATORY_CARE_PROVIDER_SITE_OTHER): Payer: Medicare Other | Admitting: Pharmacist Clinician (PhC)/ Clinical Pharmacy Specialist

## 2014-02-10 DIAGNOSIS — I4892 Unspecified atrial flutter: Secondary | ICD-10-CM

## 2014-02-10 LAB — POCT INR: INR: 1.8

## 2014-02-17 ENCOUNTER — Encounter (INDEPENDENT_AMBULATORY_CARE_PROVIDER_SITE_OTHER): Payer: Medicare Other | Admitting: Ophthalmology

## 2014-02-17 DIAGNOSIS — H43813 Vitreous degeneration, bilateral: Secondary | ICD-10-CM

## 2014-02-17 DIAGNOSIS — H2512 Age-related nuclear cataract, left eye: Secondary | ICD-10-CM

## 2014-02-17 DIAGNOSIS — H35033 Hypertensive retinopathy, bilateral: Secondary | ICD-10-CM

## 2014-02-17 DIAGNOSIS — I10 Essential (primary) hypertension: Secondary | ICD-10-CM

## 2014-02-17 DIAGNOSIS — H4312 Vitreous hemorrhage, left eye: Secondary | ICD-10-CM

## 2014-02-18 ENCOUNTER — Encounter: Payer: Self-pay | Admitting: Cardiovascular Disease

## 2014-03-19 ENCOUNTER — Ambulatory Visit (INDEPENDENT_AMBULATORY_CARE_PROVIDER_SITE_OTHER): Payer: Self-pay | Admitting: Pharmacist Clinician (PhC)/ Clinical Pharmacy Specialist

## 2014-03-19 LAB — POCT INR: INR: 1.7

## 2014-03-26 ENCOUNTER — Ambulatory Visit (INDEPENDENT_AMBULATORY_CARE_PROVIDER_SITE_OTHER): Payer: Medicare Other | Admitting: Ophthalmology

## 2014-04-03 ENCOUNTER — Ambulatory Visit (INDEPENDENT_AMBULATORY_CARE_PROVIDER_SITE_OTHER): Payer: Self-pay | Admitting: Pharmacist Clinician (PhC)/ Clinical Pharmacy Specialist

## 2014-04-03 LAB — POCT INR: INR: 1.8

## 2014-04-03 LAB — PROTIME-INR: INR: 1.8 — AB (ref 0.9–1.1)

## 2014-04-18 ENCOUNTER — Encounter (INDEPENDENT_AMBULATORY_CARE_PROVIDER_SITE_OTHER): Payer: Medicare Other | Admitting: Ophthalmology

## 2014-04-23 ENCOUNTER — Ambulatory Visit (INDEPENDENT_AMBULATORY_CARE_PROVIDER_SITE_OTHER): Payer: Medicare Other | Admitting: Pharmacist Clinician (PhC)/ Clinical Pharmacy Specialist

## 2014-04-23 DIAGNOSIS — Z7901 Long term (current) use of anticoagulants: Secondary | ICD-10-CM

## 2014-04-23 DIAGNOSIS — I4892 Unspecified atrial flutter: Secondary | ICD-10-CM

## 2014-04-23 LAB — POCT INR: INR: 2.3

## 2014-05-07 ENCOUNTER — Ambulatory Visit (INDEPENDENT_AMBULATORY_CARE_PROVIDER_SITE_OTHER): Payer: Self-pay | Admitting: Pharmacist Clinician (PhC)/ Clinical Pharmacy Specialist

## 2014-05-07 LAB — POCT INR: INR: 2.1

## 2014-05-07 LAB — PROTIME-INR: INR: 2 — AB (ref 0.9–1.1)

## 2014-05-16 LAB — POCT INR: INR: 2.7

## 2014-05-19 ENCOUNTER — Ambulatory Visit (INDEPENDENT_AMBULATORY_CARE_PROVIDER_SITE_OTHER): Payer: Medicare Other | Admitting: Pharmacist Clinician (PhC)/ Clinical Pharmacy Specialist

## 2014-05-22 ENCOUNTER — Telehealth: Payer: Self-pay | Admitting: Cardiovascular Disease

## 2014-05-22 NOTE — Telephone Encounter (Signed)
Howard Torres is calling in because she says that for the past few weeks her father has not been feeling well and he has been feeling weak. She would like for him to be seen by Dr. Salena Saner as soon as possible. Please call back  thanks

## 2014-05-22 NOTE — Telephone Encounter (Signed)
Left message for pt to call.

## 2014-05-29 ENCOUNTER — Ambulatory Visit (INDEPENDENT_AMBULATORY_CARE_PROVIDER_SITE_OTHER): Payer: Medicare Other | Admitting: Pharmacist Clinician (PhC)/ Clinical Pharmacy Specialist

## 2014-05-29 ENCOUNTER — Ambulatory Visit (INDEPENDENT_AMBULATORY_CARE_PROVIDER_SITE_OTHER): Payer: Medicare Other | Admitting: Cardiovascular Disease

## 2014-05-29 ENCOUNTER — Encounter: Payer: Self-pay | Admitting: Cardiovascular Disease

## 2014-05-29 VITALS — BP 134/62 | HR 87 | Resp 20 | Ht 67.0 in | Wt 214.0 lb

## 2014-05-29 DIAGNOSIS — I482 Chronic atrial fibrillation, unspecified: Secondary | ICD-10-CM

## 2014-05-29 DIAGNOSIS — R079 Chest pain, unspecified: Secondary | ICD-10-CM

## 2014-05-29 DIAGNOSIS — I2510 Atherosclerotic heart disease of native coronary artery without angina pectoris: Secondary | ICD-10-CM

## 2014-05-29 DIAGNOSIS — Z950 Presence of cardiac pacemaker: Secondary | ICD-10-CM

## 2014-05-29 DIAGNOSIS — I279 Pulmonary heart disease, unspecified: Secondary | ICD-10-CM

## 2014-05-29 DIAGNOSIS — Z79899 Other long term (current) drug therapy: Secondary | ICD-10-CM | POA: Diagnosis not present

## 2014-05-29 DIAGNOSIS — I10 Essential (primary) hypertension: Secondary | ICD-10-CM

## 2014-05-29 DIAGNOSIS — E785 Hyperlipidemia, unspecified: Secondary | ICD-10-CM | POA: Diagnosis not present

## 2014-05-29 DIAGNOSIS — I4892 Unspecified atrial flutter: Secondary | ICD-10-CM

## 2014-05-29 LAB — MDC_IDC_ENUM_SESS_TYPE_INCLINIC
Battery Remaining Longevity: 5.5
Battery Voltage: 2.77 V
Lead Channel Impedance Value: 561 Ohm
Lead Channel Pacing Threshold Amplitude: 0.5 V
Lead Channel Pacing Threshold Pulse Width: 0.4 ms
Lead Channel Sensing Intrinsic Amplitude: 5.6 mV
Lead Channel Setting Pacing Pulse Width: 0.4 ms
MDC IDC MSMT BATTERY IMPEDANCE: 897 Ohm
MDC IDC SET LEADCHNL RV PACING AMPLITUDE: 2.5 V
MDC IDC SET LEADCHNL RV SENSING SENSITIVITY: 2 mV
MDC IDC STAT BRADY RV PERCENT PACED: 66.2 %

## 2014-05-29 LAB — LIPID PANEL
Cholesterol: 102 mg/dL (ref 0–200)
HDL: 41 mg/dL (ref >=40)
LDL CALC: 38 mg/dL (ref 0–99)
Total CHOL/HDL Ratio: 2.5 Ratio
Triglycerides: 114 mg/dL (ref <150)
VLDL: 23 mg/dL (ref 0–40)

## 2014-05-29 LAB — TSH: TSH: 2.379 u[IU]/mL (ref 0.350–4.500)

## 2014-05-29 LAB — CBC
HCT: 45 % (ref 39.0–52.0)
Hemoglobin: 14.7 g/dL (ref 13.0–17.0)
MCH: 29.9 pg (ref 26.0–34.0)
MCHC: 32.7 g/dL (ref 30.0–36.0)
MCV: 91.5 fL (ref 78.0–100.0)
MPV: 9.2 fL (ref 8.6–12.4)
PLATELETS: 113 10*3/uL — AB (ref 150–400)
RBC: 4.92 MIL/uL (ref 4.22–5.81)
RDW: 14.3 % (ref 11.5–15.5)
WBC: 5.8 10*3/uL (ref 4.0–10.5)

## 2014-05-29 LAB — COMPREHENSIVE METABOLIC PANEL
ALT: 9 U/L (ref 0–53)
AST: 19 U/L (ref 0–37)
Albumin: 3.9 g/dL (ref 3.5–5.2)
Alkaline Phosphatase: 53 U/L (ref 39–117)
BILIRUBIN TOTAL: 1 mg/dL (ref 0.2–1.2)
BUN: 18 mg/dL (ref 6–23)
CHLORIDE: 91 meq/L — AB (ref 96–112)
CO2: 42 mEq/L — ABNORMAL HIGH (ref 19–32)
CREATININE: 1.03 mg/dL (ref 0.50–1.35)
Calcium: 8.8 mg/dL (ref 8.4–10.5)
GLUCOSE: 75 mg/dL (ref 70–99)
Potassium: 4.9 mEq/L (ref 3.5–5.3)
Sodium: 139 mEq/L (ref 135–145)
TOTAL PROTEIN: 6.3 g/dL (ref 6.0–8.3)

## 2014-05-29 LAB — POCT INR: INR: 2.2

## 2014-05-29 MED ORDER — NITROGLYCERIN 0.4 MG SL SUBL: 0.4000 mg | SUBLINGUAL_TABLET | SUBLINGUAL | Status: DC | PRN

## 2014-05-29 NOTE — Progress Notes (Signed)
Patient ID: Howard Combes., male   DOB: 02/23/1935, 79 y.o.   MRN: [id number]     Cardiology Office Note   Date:  06/01/2014   ID:  Howard Combes., DOB 07-21-1935, MRN [id number]  PCP:  Aida Puffer, MD  Cardiologist:   Thurmon Fair, MD   Chief Complaint  Patient presents with  . Chest Pain    worse with exertion, sharp and shooting, with radiation to shoulder, occuring off and on for the last 2 months.      History of Present Illness: Howard Popowski. is a 79 y.o. male who presents for Atrial fibrillation, pulmonary hypertension, pacemaker check.  He has worsening edema that is just above his ankles bilaterally. He has chronic dyspnea, hard to quantify since he is extremely sedentary. He continues to smoke. He feels cold all the time and is sluggish.  He has a sharp shooting pain in his left chest radiating into his left shoulder, clearly worse with movement.  Howard Torres has single chamber Medtronic Adapta permanent pacemaker implant in 2011 for atrial fibrillation with slow ventricular response.  He has long-standing respiratory problems secondary to COPD and obstructive sleep apnea and has moderate pulmonary artery hypertension with an estimated systolic PA pressure of around 50 mm Hg. He has previously had problems with edema and this has recently worsened .  He does not have known coronary artery disease and had a normal nuclear stress test in 2010. Because he was complaining of intermittent chest pain, we had scheduled him for another study in December of 2013 which he canceled. He has preserved left ventricular systolic function.  He has a chronic suprapubic urinary catheter. He is very inactive.  He is on chronic anticoagulation with warfarin followed in our Coumadin clinic. He does not have a history of known cardioembolic events and has not had any bleeding problems.   Pacemaker interrogation shows normal function. Medtronic Adapta device implanted 2011 with  approximately 5.5 years anticipated longevity. There is 66% V pacing and only one 5" high ventricular rate episode.  Past Medical History  Diagnosis Date  . COPD (chronic obstructive pulmonary disease)   . Pacemaker   . GERD (gastroesophageal reflux disease)   . CHF (congestive heart failure)   . OSA (obstructive sleep apnea)   . Permanent atrial fibrillation   . Pulmonary hypertension   . Chest pain syndrome   . Dyslipidemia     Past Surgical History  Procedure Laterality Date  . Prostate surgery    . Hernia repair    . Nm myocar perf wall motion  11/25/2008    normal  . Permanent pacemaker insertion  11/16/2009    medtronic     Current Outpatient Prescriptions  Medication Sig Dispense Refill  . albuterol (PROVENTIL HFA;VENTOLIN HFA) 108 (90 BASE) MCG/ACT inhaler Inhale 2 puffs into the lungs every 6 (six) hours as needed for wheezing or shortness of breath.    Marland Kitchen aspirin 81 MG chewable tablet Chew 81 mg by mouth daily.    . cyanocobalamin (,VITAMIN B-12,) 1000 MCG/ML injection Inject 1,000 mcg into the muscle every 30 (thirty) days.    . diazepam (VALIUM) 10 MG tablet Take 10 mg by mouth every 6 (six) hours as needed for anxiety.    . furosemide (LASIX) 20 MG tablet Take 20 mg by mouth daily as needed (swelling).     Marland Kitchen ketoconazole (NIZORAL) 2 % shampoo Apply 1 application topically every other day.     . lidocaine (  LIDODERM) 5 % Place 1 patch onto the skin. Remove & Discard patch within 12 hours or as directed by MD.  Apply up to 3 patches per day to various sites.    . mometasone (NASONEX) 50 MCG/ACT nasal spray Place 2 sprays into the nose daily.    . nitroGLYCERIN (NITROSTAT) 0.4 MG SL tablet Place 1 tablet (0.4 mg total) under the tongue every 5 (five) minutes as needed for chest pain. 25 tablet 3  . omeprazole (PRILOSEC) 20 MG capsule Take 20 mg by mouth daily.    Marland Kitchen. oxyCODONE-acetaminophen (PERCOCET) 10-325 MG per tablet Take 1 tablet by mouth every 6 (six) hours as needed  for pain.     . potassium chloride (K-DUR,KLOR-CON) 10 MEQ tablet Take 10 mEq by mouth daily.    . rosuvastatin (CRESTOR) 20 MG tablet Take 20 mg by mouth daily.    . sodium chloride (OCEAN) 0.65 % nasal spray Place 1 spray into the nose as needed for congestion.    . Vitamin D, Ergocalciferol, (DRISDOL) 50000 UNITS CAPS Take 50,000 Units by mouth every 7 (seven) days. Wednesdays.    Marland Kitchen. warfarin (COUMADIN) 5 MG tablet TAKE 1 & 1/2 TABLETS DAILY AS DIRECTED 45 tablet 5   No current facility-administered medications for this visit.    Allergies:   Review of patient's allergies indicates no known allergies.    Social History:  The patient  reports that he has been smoking.  He has never used smokeless tobacco. He reports that he does not drink alcohol or use illicit drugs.   Family History:  The patient's FHx is not contributory  ROS:  Please see the history of present illness.    Otherwise, review of systems positive for none.   All other systems are reviewed and negative.    PHYSICAL EXAM: VS:  BP 134/62 mmHg  Pulse 87  Resp 20  Ht 5\' 7"  (1.702 m)  Wt 214 lb (97.07 kg)  BMI 33.51 kg/m2 , BMI Body mass index is 33.51 kg/(m^2).  General: Alert, oriented x3, no distress  Head: no evidence of trauma, PERRL, EOMI, no exophtalmos or lid lag, no myxedema, no xanthelasma; normal ears, nose and oropharynx  Neck: normal jugular venous pulsations and no hepatojugular reflux; brisk carotid pulses without delay and no carotid bruits  Chest: clear to auscultation, no signs of consolidation by percussion or palpation, normal fremitus, symmetrical and full respiratory excursions; healthy left subclavian pacemaker site  Cardiovascular: normal position and quality of the apical impulse, regular rhythm, normal first and paradoxically split second heart sounds, 2/6 low systolic left lower sternal border murmur, no rubs or gallops  Abdomen: no tenderness or distention, no masses by palpation, no  abnormal pulsatility or arterial bruits, normal bowel sounds, no hepatosplenomegaly  Extremities: no clubbing, cyanosis or edema; 2+ radial, ulnar and brachial pulses bilaterally; 2+ right femoral, posterior tibial and dorsalis pedis pulses; 2+ left femoral, posterior tibial and dorsalis pedis pulses; no subclavian or femoral bruits  Neurological: grossly nonfocal  Psych: depressed mood, full affect   EKG:  EKG is ordered today. The ekg ordered today demonstrates atrial flutter with variable AV block   Recent Labs: 05/29/2014: ALT 9; BUN 18; Creatinine 1.03; Hemoglobin 14.7; Platelets 113*; Potassium 4.9; Sodium 139; TSH 2.379    Lipid Panel    Component Value Date/Time   CHOL 102 05/29/2014 1121   TRIG 114 05/29/2014 1121   HDL 41 05/29/2014 1121   CHOLHDL 2.5 05/29/2014 1121   VLDL  23 05/29/2014 1121   LDLCALC 38 05/29/2014 1121      Wt Readings from Last 3 Encounters:  05/29/14 214 lb (97.07 kg)  07/22/13 204 lb 6.4 oz (92.715 kg)  12/20/12 204 lb 9.6 oz (92.806 kg)      ASSESSMENT AND PLAN:  1.  ATRIAL FLUTTER (FIBRILLATION) with slow ventricular response Continues to have very coarse atrial fibrillation, possibly atypical atrial flutter. He is therapeutically anticoagulated without any embolic or bleeding complications. Despite not being on rate control medications his ventricular pacing frequency is 66%. This does not appear to have led to worsening signs or symptoms of heart failure.  2. Pacemaker Comprehensive interrogation of his pacemaker shows normal function. There was no need for permanent reprogramming changes.  3. COR PULMONALE and probable right heart failure Is is probably contributing to his lower extremity edema, but he also has very prominent varicose veins. Have told him to watch for development of nonhealing ulcers. Temporarily increase furosemide and KCl. Check labs.  4. HYPERLIPIDEMIA Excellent lipid parameters   5. Check TSH for complaints of  fatigue and cold.   Current medicines are reviewed at length with the patient today.  The patient does not have concerns regarding medicines.  The following changes have been made:  Increase diuretics and K supplement temporarily, until edema improves.  Labs/ tests ordered today include:  Orders Placed This Encounter  Procedures  . CBC  . Comprehensive metabolic panel  . Lipid panel  . TSH  . Implantable device check  . EKG 12-Lead   Patient Instructions  Double Furosemide and potassium until edema (swelling) improves.  Your physician recommends that you return for lab work in: FASTING at [facility].  Remote monitoring is used to monitor your Pacemaker or ICD from home. This monitoring reduces the number of office visits required to check your device to one time per year. It allows [place] to monitor the functioning of your device to ensure it is working properly. You are scheduled for a device check from home on August 29, 2014. You may send your transmission at any time that day. If you have a wireless device, the transmission will be sent automatically. After your physician reviews your transmission, you will receive a postcard with your next transmission date.  Dr. Royann Shivers recommends that you schedule a follow-up appointment in: One year.        Howard Bimler, MD  06/01/2014 12:32 PM    Thurmon Fair, MD, [facility] [phone] office [phone] pager

## 2014-05-29 NOTE — Patient Instructions (Signed)
Double Furosemide and potassium until edema (swelling) improves.  Your physician recommends that you return for lab work in: [facility].  Remote monitoring is used to monitor your Pacemaker or ICD from home. This monitoring reduces the number of office visits required to check your device to one time per year. It allows us to monitor the functioning of your device to ensure it is working properly. You are scheduled for a device check from home on August 29, 2014. You may send your transmission at any time that day. If you have a wireless device, the transmission will be sent automatically. After your physician reviews your transmission, you will receive a postcard with your next transmission date.  Dr. Royann Shiversroitoru recommends that you schedule a follow-up appointment in: One year.

## 2014-05-30 ENCOUNTER — Telehealth: Payer: Self-pay | Admitting: *Deleted

## 2014-05-30 ENCOUNTER — Telehealth: Payer: Self-pay | Admitting: Cardiovascular Disease

## 2014-05-30 DIAGNOSIS — R899 Unspecified abnormal finding in specimens from other organs, systems and tissues: Secondary | ICD-10-CM

## 2014-05-30 NOTE — Telephone Encounter (Signed)
[facility] called reporting alert high from yesterday's lab draw:  CO2 - 42  If no action needed please close encounter. Thanks.

## 2014-05-30 NOTE — Telephone Encounter (Signed)
-----   Message from Thurmon FairMihai Croitoru, MD sent at 05/30/2014  8:42 AM EDT ----- Labs look good. Elevated CO2 is related to lung problems and not much changed from past. Cholesterol an TSH are excellent. Slight reduction in platelets is likely a common artifact, but suggest a repeat CBC in 3 months

## 2014-05-30 NOTE — Telephone Encounter (Signed)
Lab results called and will have a CBC rechecked in 3 months to recheck the platelets.  Voiced understanding.  Order mailed to patient.

## 2014-06-01 ENCOUNTER — Encounter: Payer: Self-pay | Admitting: Cardiovascular Disease

## 2014-06-03 ENCOUNTER — Encounter: Payer: Self-pay | Admitting: Cardiovascular Disease

## 2014-06-09 ENCOUNTER — Encounter: Payer: Self-pay | Admitting: Cardiovascular Disease

## 2014-06-25 ENCOUNTER — Ambulatory Visit (INDEPENDENT_AMBULATORY_CARE_PROVIDER_SITE_OTHER): Payer: Self-pay | Admitting: Pharmacist Clinician (PhC)/ Clinical Pharmacy Specialist

## 2014-06-25 DIAGNOSIS — I4892 Unspecified atrial flutter: Secondary | ICD-10-CM

## 2014-06-25 LAB — POCT INR: INR: 1.6

## 2014-07-10 ENCOUNTER — Telehealth: Payer: Self-pay | Admitting: Pharmacist Clinician (PhC)/ Clinical Pharmacy Specialist

## 2014-07-10 LAB — POCT INR: INR: 1.6

## 2014-07-10 NOTE — Telephone Encounter (Signed)
Patient needs to give you his INR results

## 2014-07-11 ENCOUNTER — Ambulatory Visit (INDEPENDENT_AMBULATORY_CARE_PROVIDER_SITE_OTHER): Payer: Self-pay | Admitting: Pharmacist Clinician (PhC)/ Clinical Pharmacy Specialist

## 2014-07-11 DIAGNOSIS — I4892 Unspecified atrial flutter: Secondary | ICD-10-CM

## 2014-07-11 NOTE — Telephone Encounter (Signed)
See anticoag note

## 2014-07-28 ENCOUNTER — Ambulatory Visit (INDEPENDENT_AMBULATORY_CARE_PROVIDER_SITE_OTHER): Payer: Self-pay | Admitting: Pharmacist Clinician (PhC)/ Clinical Pharmacy Specialist

## 2014-07-28 DIAGNOSIS — I4892 Unspecified atrial flutter: Secondary | ICD-10-CM

## 2014-07-28 LAB — POCT INR: INR: 1.5

## 2014-07-28 LAB — PROTIME-INR: INR: 1.5 — AB (ref 0.9–1.1)

## 2014-08-12 ENCOUNTER — Ambulatory Visit (INDEPENDENT_AMBULATORY_CARE_PROVIDER_SITE_OTHER): Payer: Self-pay | Admitting: Pharmacist Clinician (PhC)/ Clinical Pharmacy Specialist

## 2014-08-12 DIAGNOSIS — I4892 Unspecified atrial flutter: Secondary | ICD-10-CM

## 2014-08-12 LAB — POCT INR: INR: 1.6

## 2014-08-17 ENCOUNTER — Other Ambulatory Visit: Payer: Self-pay | Admitting: Cardiovascular Disease

## 2014-09-01 ENCOUNTER — Encounter: Payer: Medicare Other | Admitting: *Deleted

## 2014-09-01 ENCOUNTER — Telehealth: Payer: Self-pay | Admitting: Cardiology

## 2014-09-01 NOTE — Telephone Encounter (Signed)
LMOVM reminding pt to send remote transmission.   

## 2014-09-02 ENCOUNTER — Encounter: Payer: Self-pay | Admitting: Cardiology

## 2014-09-03 ENCOUNTER — Telehealth: Payer: Self-pay | Admitting: Cardiovascular Disease

## 2014-09-03 ENCOUNTER — Ambulatory Visit (INDEPENDENT_AMBULATORY_CARE_PROVIDER_SITE_OTHER): Payer: Self-pay | Admitting: Pharmacist Clinician (PhC)/ Clinical Pharmacy Specialist

## 2014-09-03 DIAGNOSIS — I4892 Unspecified atrial flutter: Secondary | ICD-10-CM

## 2014-09-03 LAB — POCT INR: INR: 1.4

## 2014-09-03 NOTE — Telephone Encounter (Signed)
She has critical PT INR results to report  Thanks

## 2014-09-03 NOTE — Telephone Encounter (Signed)
See anticoag note

## 2014-09-03 NOTE — Telephone Encounter (Signed)
Message already received regarding critical INR of 1.4 and was routed to [facility].

## 2014-09-03 NOTE — Telephone Encounter (Signed)
Howard Torres is calling with a Critical INR ..Marland Kitchen

## 2014-09-03 NOTE — Telephone Encounter (Signed)
INR 1.4 reported at 9:32am  [facility]

## 2014-09-12 ENCOUNTER — Encounter: Payer: Self-pay | Admitting: Cardiovascular Disease

## 2014-09-17 ENCOUNTER — Telehealth: Payer: Self-pay | Admitting: Pharmacist Clinician (PhC)/ Clinical Pharmacy Specialist

## 2014-09-17 ENCOUNTER — Ambulatory Visit (INDEPENDENT_AMBULATORY_CARE_PROVIDER_SITE_OTHER): Payer: Self-pay | Admitting: Pharmacist Clinician (PhC)/ Clinical Pharmacy Specialist

## 2014-09-17 DIAGNOSIS — I4892 Unspecified atrial flutter: Secondary | ICD-10-CM

## 2014-09-17 LAB — POCT INR: INR: 1.5

## 2014-09-17 NOTE — Telephone Encounter (Signed)
See anticoag note

## 2014-09-17 NOTE — Telephone Encounter (Signed)
Patient calling his INR results  1.5---please call with new dosage.

## 2014-10-03 ENCOUNTER — Ambulatory Visit (INDEPENDENT_AMBULATORY_CARE_PROVIDER_SITE_OTHER): Payer: Self-pay | Admitting: Pharmacist Clinician (PhC)/ Clinical Pharmacy Specialist

## 2014-10-03 DIAGNOSIS — I4892 Unspecified atrial flutter: Secondary | ICD-10-CM

## 2014-10-03 LAB — POCT INR: INR: 1.6

## 2014-10-16 LAB — POCT INR: INR: 1.6

## 2014-10-17 ENCOUNTER — Ambulatory Visit (INDEPENDENT_AMBULATORY_CARE_PROVIDER_SITE_OTHER): Payer: Self-pay | Admitting: Pharmacist Clinician (PhC)/ Clinical Pharmacy Specialist

## 2014-10-17 DIAGNOSIS — I4892 Unspecified atrial flutter: Secondary | ICD-10-CM

## 2014-10-28 LAB — POCT INR: INR: 1.9

## 2014-10-29 ENCOUNTER — Ambulatory Visit (INDEPENDENT_AMBULATORY_CARE_PROVIDER_SITE_OTHER): Payer: Self-pay | Admitting: Pharmacist Clinician (PhC)/ Clinical Pharmacy Specialist

## 2014-10-29 DIAGNOSIS — I4892 Unspecified atrial flutter: Secondary | ICD-10-CM

## 2014-11-06 ENCOUNTER — Ambulatory Visit (INDEPENDENT_AMBULATORY_CARE_PROVIDER_SITE_OTHER): Payer: Self-pay | Admitting: Pharmacist Clinician (PhC)/ Clinical Pharmacy Specialist

## 2014-11-06 DIAGNOSIS — I4892 Unspecified atrial flutter: Secondary | ICD-10-CM

## 2014-11-06 LAB — POCT INR: INR: 1.9

## 2014-11-11 ENCOUNTER — Encounter: Payer: Self-pay | Admitting: Internal Medicine

## 2014-11-11 ENCOUNTER — Ambulatory Visit (INDEPENDENT_AMBULATORY_CARE_PROVIDER_SITE_OTHER): Payer: Medicare Other | Admitting: Internal Medicine

## 2014-11-11 VITALS — BP 130/70 | HR 60 | Ht 67.0 in | Wt 208.0 lb

## 2014-11-11 DIAGNOSIS — J449 Chronic obstructive pulmonary disease, unspecified: Secondary | ICD-10-CM

## 2014-11-11 NOTE — Assessment & Plan Note (Signed)
pfts 07/29/08  FEV1  2.78 (100%) ratio 73   Pt declined to answer any questions re: symptoms so I have nothing to offer him at this point but the main thing he needs to do to help himself is stop smoking  I considered starting him on BREO but he declined this as well > no charge for this visit > he should return to see DR Little and consider referral to [facility] or [facility] if pulmoary specialty services are desired by the pt

## 2014-11-11 NOTE — Patient Instructions (Signed)
Return to Dr Clarene Duke for care/ no f/u here planned

## 2014-11-11 NOTE — Progress Notes (Signed)
Subjective:     Patient ID: Howard Torres., male   DOB: 1935/11/02     MRN: [id number]  HPI   73 yowm active smoker with h/o sleep apnea ? Per Vassie Loll broke around 2014 and not replaced "didn't know who to call" wearing 2lpm at hs    11/11/2014 1st [facility] office visit/ [facility]   Chief Complaint  Patient presents with  . Pulmonary Consult    Referred by Dr. Aida Puffer. Pt has seen Dr Vassie Loll in the past for COPD. He states that his breathing has been worse for the past 2 months. He gets SOB walking from room to room at home.   Q what is bothering you ?  "check with doctor Little, he sent me"   Still smoking / not interested in answering any questions regarding his symptoms, declined my advice to check 02 sats on / off 02 and walking and rest. Offered sample of maint rx but declined that as well       Review of Systems     Objective:   Physical Exam  Not on 02 - set on 0 though wearing his cannulae, sats 90% RA   No formal exam done > pt declined     Assessment:

## 2014-11-20 LAB — POCT INR: INR: 1.8

## 2014-11-21 ENCOUNTER — Ambulatory Visit (INDEPENDENT_AMBULATORY_CARE_PROVIDER_SITE_OTHER): Payer: Medicare Other | Admitting: Pharmacist Clinician (PhC)/ Clinical Pharmacy Specialist

## 2014-11-21 DIAGNOSIS — I4892 Unspecified atrial flutter: Secondary | ICD-10-CM

## 2014-11-21 DIAGNOSIS — Z7901 Long term (current) use of anticoagulants: Secondary | ICD-10-CM

## 2014-12-04 ENCOUNTER — Ambulatory Visit (INDEPENDENT_AMBULATORY_CARE_PROVIDER_SITE_OTHER): Payer: Self-pay | Admitting: Pharmacist Clinician (PhC)/ Clinical Pharmacy Specialist

## 2014-12-04 DIAGNOSIS — I4892 Unspecified atrial flutter: Secondary | ICD-10-CM

## 2014-12-04 DIAGNOSIS — Z7901 Long term (current) use of anticoagulants: Secondary | ICD-10-CM

## 2014-12-04 LAB — POCT INR: INR: 2

## 2014-12-18 ENCOUNTER — Ambulatory Visit (INDEPENDENT_AMBULATORY_CARE_PROVIDER_SITE_OTHER): Payer: Medicare Other | Admitting: Pharmacist Clinician (PhC)/ Clinical Pharmacy Specialist

## 2014-12-18 ENCOUNTER — Telehealth: Payer: Self-pay | Admitting: Pharmacist Clinician (PhC)/ Clinical Pharmacy Specialist

## 2014-12-18 DIAGNOSIS — Z7901 Long term (current) use of anticoagulants: Secondary | ICD-10-CM

## 2014-12-18 DIAGNOSIS — I4892 Unspecified atrial flutter: Secondary | ICD-10-CM

## 2014-12-18 LAB — POCT INR: INR: 2.3

## 2014-12-18 NOTE — Telephone Encounter (Signed)
Mrs Howard Torres has his INR results.

## 2014-12-19 LAB — POCT INR: INR: 2.3

## 2015-01-13 ENCOUNTER — Ambulatory Visit (INDEPENDENT_AMBULATORY_CARE_PROVIDER_SITE_OTHER): Payer: Medicare Other | Admitting: Pharmacist Clinician (PhC)/ Clinical Pharmacy Specialist

## 2015-01-13 DIAGNOSIS — I4892 Unspecified atrial flutter: Secondary | ICD-10-CM

## 2015-01-13 DIAGNOSIS — Z7901 Long term (current) use of anticoagulants: Secondary | ICD-10-CM

## 2015-01-13 LAB — POCT INR: INR: 1.6

## 2015-02-02 ENCOUNTER — Ambulatory Visit (INDEPENDENT_AMBULATORY_CARE_PROVIDER_SITE_OTHER): Payer: Medicare Other | Admitting: Pharmacist Clinician (PhC)/ Clinical Pharmacy Specialist

## 2015-02-02 ENCOUNTER — Telehealth: Payer: Self-pay | Admitting: Cardiovascular Disease

## 2015-02-02 DIAGNOSIS — Z7901 Long term (current) use of anticoagulants: Secondary | ICD-10-CM

## 2015-02-02 DIAGNOSIS — I4892 Unspecified atrial flutter: Secondary | ICD-10-CM

## 2015-02-02 LAB — POCT INR: INR: 1.7

## 2015-02-02 NOTE — Telephone Encounter (Deleted)
error 

## 2015-02-17 ENCOUNTER — Other Ambulatory Visit: Payer: Self-pay | Admitting: Cardiovascular Disease

## 2015-02-18 ENCOUNTER — Ambulatory Visit (INDEPENDENT_AMBULATORY_CARE_PROVIDER_SITE_OTHER): Payer: Medicare Other | Admitting: Pharmacist Clinician (PhC)/ Clinical Pharmacy Specialist

## 2015-02-18 DIAGNOSIS — I4892 Unspecified atrial flutter: Secondary | ICD-10-CM

## 2015-02-18 DIAGNOSIS — Z7901 Long term (current) use of anticoagulants: Secondary | ICD-10-CM

## 2015-02-18 LAB — PROTIME-INR: INR: 1.6 — AB (ref <=1.1)

## 2015-03-16 LAB — POCT INR: INR: 2

## 2015-03-17 ENCOUNTER — Ambulatory Visit (INDEPENDENT_AMBULATORY_CARE_PROVIDER_SITE_OTHER): Payer: Medicare Other | Admitting: Pharmacist Clinician (PhC)/ Clinical Pharmacy Specialist

## 2015-03-17 DIAGNOSIS — Z7901 Long term (current) use of anticoagulants: Secondary | ICD-10-CM

## 2015-03-17 DIAGNOSIS — I4892 Unspecified atrial flutter: Secondary | ICD-10-CM

## 2015-04-16 ENCOUNTER — Ambulatory Visit (INDEPENDENT_AMBULATORY_CARE_PROVIDER_SITE_OTHER): Payer: Medicare Other | Admitting: Pharmacist Clinician (PhC)/ Clinical Pharmacy Specialist

## 2015-04-16 DIAGNOSIS — Z7901 Long term (current) use of anticoagulants: Secondary | ICD-10-CM

## 2015-04-16 DIAGNOSIS — I4892 Unspecified atrial flutter: Secondary | ICD-10-CM

## 2015-04-16 LAB — POCT INR: INR: 1.8

## 2015-05-04 ENCOUNTER — Ambulatory Visit (INDEPENDENT_AMBULATORY_CARE_PROVIDER_SITE_OTHER): Payer: Medicare Other | Admitting: Pharmacist Clinician (PhC)/ Clinical Pharmacy Specialist

## 2015-05-04 DIAGNOSIS — Z7901 Long term (current) use of anticoagulants: Secondary | ICD-10-CM

## 2015-05-04 DIAGNOSIS — I4892 Unspecified atrial flutter: Secondary | ICD-10-CM

## 2015-05-04 LAB — POCT INR: INR: 1.7

## 2015-05-29 ENCOUNTER — Ambulatory Visit (INDEPENDENT_AMBULATORY_CARE_PROVIDER_SITE_OTHER): Payer: Medicare Other | Admitting: Pharmacist Clinician (PhC)/ Clinical Pharmacy Specialist

## 2015-05-29 DIAGNOSIS — Z7901 Long term (current) use of anticoagulants: Secondary | ICD-10-CM

## 2015-05-29 DIAGNOSIS — I4892 Unspecified atrial flutter: Secondary | ICD-10-CM

## 2015-05-29 LAB — POCT INR: INR: 1.8

## 2015-06-02 ENCOUNTER — Telehealth: Payer: Self-pay | Admitting: Cardiovascular Disease

## 2015-06-02 NOTE — Telephone Encounter (Signed)
Howard Torres called in wanting to speak with the doctor about the standing orders for the pt's INR. Please f/u with him   Thanks

## 2015-06-02 NOTE — Telephone Encounter (Signed)
Left msg to call. (Please transfer incoming call to triage RN)

## 2015-06-08 NOTE — Telephone Encounter (Signed)
Left 2nd msg to call. 

## 2015-06-09 NOTE — Telephone Encounter (Signed)
Spoke to [employer] states the information has been obtained for the patient by Harrold DonathNathan. Needing to know the frequency of patient's  INR frequency

## 2015-06-15 ENCOUNTER — Ambulatory Visit (INDEPENDENT_AMBULATORY_CARE_PROVIDER_SITE_OTHER): Payer: Medicare Other | Admitting: Pharmacist

## 2015-06-15 DIAGNOSIS — I4892 Unspecified atrial flutter: Secondary | ICD-10-CM

## 2015-06-15 DIAGNOSIS — Z7901 Long term (current) use of anticoagulants: Secondary | ICD-10-CM

## 2015-06-15 LAB — POCT INR: INR: 1.7

## 2015-07-08 ENCOUNTER — Ambulatory Visit (INDEPENDENT_AMBULATORY_CARE_PROVIDER_SITE_OTHER): Payer: Medicare Other | Admitting: Pharmacist Clinician (PhC)/ Clinical Pharmacy Specialist

## 2015-07-08 DIAGNOSIS — Z7901 Long term (current) use of anticoagulants: Secondary | ICD-10-CM

## 2015-07-08 DIAGNOSIS — I4892 Unspecified atrial flutter: Secondary | ICD-10-CM

## 2015-07-08 LAB — POCT INR: INR: 2.2

## 2015-07-09 LAB — POCT INR: INR: 2.2

## 2015-08-03 ENCOUNTER — Ambulatory Visit (INDEPENDENT_AMBULATORY_CARE_PROVIDER_SITE_OTHER): Payer: Medicare Other | Admitting: Pharmacist

## 2015-08-03 DIAGNOSIS — Z7901 Long term (current) use of anticoagulants: Secondary | ICD-10-CM

## 2015-08-03 DIAGNOSIS — I4892 Unspecified atrial flutter: Secondary | ICD-10-CM

## 2015-08-03 LAB — POCT INR: INR: 1.8

## 2015-08-26 ENCOUNTER — Ambulatory Visit (INDEPENDENT_AMBULATORY_CARE_PROVIDER_SITE_OTHER): Payer: Medicare Other | Admitting: Pharmacist

## 2015-08-26 DIAGNOSIS — Z7901 Long term (current) use of anticoagulants: Secondary | ICD-10-CM

## 2015-08-26 DIAGNOSIS — I4892 Unspecified atrial flutter: Secondary | ICD-10-CM

## 2015-08-26 LAB — POCT INR: INR: 1.8

## 2015-08-26 LAB — PROTIME-INR

## 2015-09-09 ENCOUNTER — Encounter: Payer: Self-pay | Admitting: Cardiovascular Disease

## 2015-09-11 ENCOUNTER — Ambulatory Visit (INDEPENDENT_AMBULATORY_CARE_PROVIDER_SITE_OTHER): Payer: Medicare Other | Admitting: Pharmacist Clinician (PhC)/ Clinical Pharmacy Specialist

## 2015-09-11 DIAGNOSIS — Z7901 Long term (current) use of anticoagulants: Secondary | ICD-10-CM

## 2015-09-11 DIAGNOSIS — I4892 Unspecified atrial flutter: Secondary | ICD-10-CM

## 2015-09-11 LAB — POCT INR: INR: 1.9

## 2015-09-15 ENCOUNTER — Other Ambulatory Visit: Payer: Self-pay | Admitting: Cardiovascular Disease

## 2015-10-02 ENCOUNTER — Ambulatory Visit (INDEPENDENT_AMBULATORY_CARE_PROVIDER_SITE_OTHER): Payer: Medicare Other | Admitting: Pharmacist

## 2015-10-02 DIAGNOSIS — Z7901 Long term (current) use of anticoagulants: Secondary | ICD-10-CM

## 2015-10-02 DIAGNOSIS — I4892 Unspecified atrial flutter: Secondary | ICD-10-CM

## 2015-10-02 LAB — POCT INR: INR: 1.6

## 2015-10-06 ENCOUNTER — Telehealth: Payer: Self-pay | Admitting: Cardiovascular Disease

## 2015-10-06 NOTE — Telephone Encounter (Signed)
New Message  Howard Torres from [facility] is calling in regards to mutual pt about a form that was faxed on 8.18.   Howard [facility] from [facility] voiced the form was prefilled, just needed MD signature.  Please follow up with [facility]. Thanks!

## 2015-10-06 NOTE — Telephone Encounter (Signed)
Returned call to [facility] requesting status update of fax sent on 8/18 (needs MD signature).  Made aware that fax was received and is in MD box, he is OOO until tomorrow.  Verbalized understanding.

## 2015-10-09 ENCOUNTER — Telehealth: Payer: Self-pay

## 2015-10-09 NOTE — Telephone Encounter (Signed)
Faxed signed home INR monitoring form to [employer] on 10/08/15.

## 2015-10-27 ENCOUNTER — Ambulatory Visit (INDEPENDENT_AMBULATORY_CARE_PROVIDER_SITE_OTHER): Payer: Medicare Other | Admitting: Pharmacist

## 2015-10-27 DIAGNOSIS — I4892 Unspecified atrial flutter: Secondary | ICD-10-CM

## 2015-10-27 DIAGNOSIS — Z7901 Long term (current) use of anticoagulants: Secondary | ICD-10-CM

## 2015-10-27 LAB — POCT INR: INR: 2.1

## 2015-11-05 ENCOUNTER — Encounter: Payer: Self-pay | Admitting: Cardiovascular Disease

## 2015-11-19 ENCOUNTER — Ambulatory Visit (INDEPENDENT_AMBULATORY_CARE_PROVIDER_SITE_OTHER): Payer: Medicare Other | Admitting: Pharmacist

## 2015-11-19 DIAGNOSIS — I4892 Unspecified atrial flutter: Secondary | ICD-10-CM

## 2015-11-19 LAB — POCT INR: INR: 1.7

## 2015-12-08 ENCOUNTER — Ambulatory Visit (INDEPENDENT_AMBULATORY_CARE_PROVIDER_SITE_OTHER): Payer: Medicare Other | Admitting: Pharmacist Clinician (PhC)/ Clinical Pharmacy Specialist

## 2015-12-08 DIAGNOSIS — I4892 Unspecified atrial flutter: Secondary | ICD-10-CM

## 2015-12-08 LAB — POCT INR: INR: 2.2

## 2015-12-30 ENCOUNTER — Ambulatory Visit (INDEPENDENT_AMBULATORY_CARE_PROVIDER_SITE_OTHER): Payer: Medicare Other | Admitting: Pharmacist

## 2015-12-30 ENCOUNTER — Telehealth: Payer: Self-pay | Admitting: Cardiology

## 2015-12-30 DIAGNOSIS — I4892 Unspecified atrial flutter: Secondary | ICD-10-CM

## 2015-12-30 LAB — POCT INR: INR: 1.6

## 2016-01-05 NOTE — Telephone Encounter (Signed)
Coag clinic note

## 2016-01-21 ENCOUNTER — Ambulatory Visit (INDEPENDENT_AMBULATORY_CARE_PROVIDER_SITE_OTHER): Payer: Medicare Other | Admitting: Pharmacist

## 2016-01-21 DIAGNOSIS — I4892 Unspecified atrial flutter: Secondary | ICD-10-CM

## 2016-01-21 LAB — POCT INR: INR: 1.9

## 2016-02-16 ENCOUNTER — Ambulatory Visit (INDEPENDENT_AMBULATORY_CARE_PROVIDER_SITE_OTHER): Payer: Medicare Other | Admitting: Pharmacist Clinician (PhC)/ Clinical Pharmacy Specialist

## 2016-02-16 DIAGNOSIS — I4892 Unspecified atrial flutter: Secondary | ICD-10-CM

## 2016-02-16 LAB — PROTIME-INR

## 2016-02-16 LAB — POCT INR: INR: 1.6

## 2016-03-04 ENCOUNTER — Ambulatory Visit (INDEPENDENT_AMBULATORY_CARE_PROVIDER_SITE_OTHER): Payer: Medicare Other | Admitting: Pharmacist

## 2016-03-04 DIAGNOSIS — I4892 Unspecified atrial flutter: Secondary | ICD-10-CM

## 2016-03-04 LAB — PROTIME-INR: INR: 1.8 — AB (ref 0.9–1.1)

## 2016-03-21 ENCOUNTER — Ambulatory Visit (INDEPENDENT_AMBULATORY_CARE_PROVIDER_SITE_OTHER): Payer: Medicare Other | Admitting: Pharmacist

## 2016-03-21 DIAGNOSIS — I4892 Unspecified atrial flutter: Secondary | ICD-10-CM

## 2016-03-21 LAB — PROTIME-INR: INR: 1.7 — AB (ref 0.9–1.1)

## 2016-03-30 ENCOUNTER — Other Ambulatory Visit: Payer: Self-pay | Admitting: Cardiovascular Disease

## 2016-04-05 ENCOUNTER — Ambulatory Visit (INDEPENDENT_AMBULATORY_CARE_PROVIDER_SITE_OTHER): Payer: Medicare Other | Admitting: Pharmacist Clinician (PhC)/ Clinical Pharmacy Specialist

## 2016-04-05 DIAGNOSIS — I4892 Unspecified atrial flutter: Secondary | ICD-10-CM | POA: Diagnosis not present

## 2016-04-05 LAB — POCT INR: INR: 1.7

## 2016-04-05 LAB — PROTIME-INR

## 2016-04-28 ENCOUNTER — Ambulatory Visit (INDEPENDENT_AMBULATORY_CARE_PROVIDER_SITE_OTHER): Payer: Medicare Other | Admitting: Pharmacist Clinician (PhC)/ Clinical Pharmacy Specialist

## 2016-04-28 DIAGNOSIS — I4892 Unspecified atrial flutter: Secondary | ICD-10-CM

## 2016-04-28 LAB — POCT INR: INR: 2.4

## 2016-05-02 ENCOUNTER — Ambulatory Visit (INDEPENDENT_AMBULATORY_CARE_PROVIDER_SITE_OTHER): Payer: Medicare Other | Admitting: Pharmacist

## 2016-05-02 DIAGNOSIS — I4892 Unspecified atrial flutter: Secondary | ICD-10-CM

## 2016-05-02 LAB — PROTIME-INR: INR: 1.7 — AB (ref <=1.1)

## 2016-05-16 ENCOUNTER — Ambulatory Visit (INDEPENDENT_AMBULATORY_CARE_PROVIDER_SITE_OTHER): Payer: Medicare Other | Admitting: Pharmacist Clinician (PhC)/ Clinical Pharmacy Specialist

## 2016-05-16 DIAGNOSIS — I4892 Unspecified atrial flutter: Secondary | ICD-10-CM

## 2016-05-16 LAB — POCT INR: INR: 1.6

## 2016-05-30 ENCOUNTER — Ambulatory Visit (INDEPENDENT_AMBULATORY_CARE_PROVIDER_SITE_OTHER): Payer: Medicare Other | Admitting: Pharmacist

## 2016-05-30 DIAGNOSIS — Z7901 Long term (current) use of anticoagulants: Secondary | ICD-10-CM

## 2016-05-30 DIAGNOSIS — I4892 Unspecified atrial flutter: Secondary | ICD-10-CM

## 2016-05-30 LAB — PROTIME-INR: INR: 1.5 — AB (ref 0.9–1.1)

## 2016-06-15 ENCOUNTER — Ambulatory Visit (INDEPENDENT_AMBULATORY_CARE_PROVIDER_SITE_OTHER): Payer: Medicare Other | Admitting: Pharmacist Clinician (PhC)/ Clinical Pharmacy Specialist

## 2016-06-15 DIAGNOSIS — Z7901 Long term (current) use of anticoagulants: Secondary | ICD-10-CM

## 2016-06-15 DIAGNOSIS — I4892 Unspecified atrial flutter: Secondary | ICD-10-CM

## 2016-06-15 LAB — POCT INR: INR: 1.8

## 2016-06-28 ENCOUNTER — Ambulatory Visit (INDEPENDENT_AMBULATORY_CARE_PROVIDER_SITE_OTHER): Payer: Medicare Other | Admitting: Pharmacist

## 2016-06-28 DIAGNOSIS — Z7901 Long term (current) use of anticoagulants: Secondary | ICD-10-CM

## 2016-06-28 DIAGNOSIS — I4892 Unspecified atrial flutter: Secondary | ICD-10-CM

## 2016-06-28 LAB — PROTIME-INR: INR: 1.5 — AB (ref 0.9–1.1)

## 2016-07-14 ENCOUNTER — Ambulatory Visit (INDEPENDENT_AMBULATORY_CARE_PROVIDER_SITE_OTHER): Payer: Self-pay | Admitting: Pharmacist

## 2016-07-14 DIAGNOSIS — I4892 Unspecified atrial flutter: Secondary | ICD-10-CM

## 2016-07-14 DIAGNOSIS — Z7901 Long term (current) use of anticoagulants: Secondary | ICD-10-CM

## 2016-07-14 LAB — PROTIME-INR: INR: 1.6 — AB (ref 0.9–1.1)

## 2016-07-28 ENCOUNTER — Ambulatory Visit (INDEPENDENT_AMBULATORY_CARE_PROVIDER_SITE_OTHER): Payer: Medicare Other | Admitting: Pharmacist

## 2016-07-28 DIAGNOSIS — I4892 Unspecified atrial flutter: Secondary | ICD-10-CM

## 2016-07-28 DIAGNOSIS — Z7901 Long term (current) use of anticoagulants: Secondary | ICD-10-CM

## 2016-07-28 LAB — PROTIME-INR: INR: 2 — AB (ref 0.9–1.1)

## 2016-08-15 ENCOUNTER — Ambulatory Visit (INDEPENDENT_AMBULATORY_CARE_PROVIDER_SITE_OTHER): Payer: Medicare Other | Admitting: Pharmacist

## 2016-08-15 ENCOUNTER — Telehealth: Payer: Self-pay | Admitting: Cardiovascular Disease

## 2016-08-15 DIAGNOSIS — Z7901 Long term (current) use of anticoagulants: Secondary | ICD-10-CM

## 2016-08-15 DIAGNOSIS — I4892 Unspecified atrial flutter: Secondary | ICD-10-CM

## 2016-08-15 LAB — PROTIME-INR: INR: 1.6 — AB (ref <=1.1)

## 2016-08-15 NOTE — Telephone Encounter (Signed)
Received call from patient he stated he has ben having chest pain off and on since last week.Stated last night chest pain seemed worse.Took NTG x 1 with relief.Stated no chest pain at present.Advised to keep appointment as scheduled with Theodore Demarkhonda Barrett PA 08/16/16 at 10:30 am.Advised if chest pain returns to [facility].

## 2016-08-15 NOTE — Telephone Encounter (Signed)
Pt c/o of Chest Pain: STAT if CP now or developed within 24 hours  1. Are you having CP right now? No   2. Are you experiencing any other symptoms (ex. SOB, nausea, vomiting, sweating)? Nausea and sweating    3. How long have you been experiencing CP? Ten days   4. Is your CP continuous or coming and going? Coming and going   5. Have you taken Nitroglycerin? 1 ? Patient states that he believes he had a "slight heart-attack last night"

## 2016-08-16 ENCOUNTER — Ambulatory Visit (INDEPENDENT_AMBULATORY_CARE_PROVIDER_SITE_OTHER): Payer: Medicare Other | Admitting: Physician Assistant

## 2016-08-16 ENCOUNTER — Encounter: Payer: Self-pay | Admitting: Physician Assistant

## 2016-08-16 ENCOUNTER — Ambulatory Visit
Admission: RE | Admit: 2016-08-16 | Discharge: 2016-08-16 | Disposition: A | Discharge status: Home / Self Care | Payer: Medicare Other | Source: Ambulatory Visit | Attending: Physician Assistant | Admitting: Physician Assistant

## 2016-08-16 VITALS — BP 124/68 | HR 60 | Ht 67.5 in | Wt 191.0 lb

## 2016-08-16 DIAGNOSIS — R079 Chest pain, unspecified: Secondary | ICD-10-CM

## 2016-08-16 DIAGNOSIS — I4891 Unspecified atrial fibrillation: Secondary | ICD-10-CM

## 2016-08-16 DIAGNOSIS — I2721 Secondary pulmonary arterial hypertension: Secondary | ICD-10-CM | POA: Diagnosis not present

## 2016-08-16 DIAGNOSIS — R042 Hemoptysis: Secondary | ICD-10-CM | POA: Diagnosis not present

## 2016-08-16 NOTE — Patient Instructions (Signed)
Medication Instructions:  Your physician recommends that you continue on your current medications as directed. Please refer to the Current Medication list given to you today. If you need a refill on your cardiac medications before your next appointment, please call your pharmacy.  Labwork: Tsh, cmet, cbc w/diff, inr and troponin  HERE IN OUR OFFICE AT [facility]  Testing/Procedures: Your physician has requested that you have an echocardiogram. Echocardiography is a painless test that uses sound waves to create images of your heart. It provides your doctor with information about the size and shape of your heart and how well your heart's chambers and valves are working. This procedure takes approximately one hour. There are no restrictions for this procedure.  Follow-Up: Your physician wants you to follow-up in: 1st AVAILABLE WITH DR [id number].    Thank you for choosing [facility]!!

## 2016-08-16 NOTE — Progress Notes (Signed)
Cardiology Office Note   Date:  08/16/2016   ID:  Howard Torres Rey Jr., DOB 02/04/1936, MRN [id number]  PCP:  Aida PufferLittle, James, MD  Cardiologist:  Dr. Royann Shiversroitoru, 05/29/2014  Theodore DemarkBarrett, Rhonda, PA-Torres   Chief Complaint  Patient presents with  . Chest Pain  . Edema    legs feel cold  . Hoarse    History of Present Illness: Howard Torres Herard Jr. is a 81 y.o. male with a history of A. Fib w/ slow VR s/p MDT PPM 2011, PAH w/ PAS approx 52, chronic anticoag w/ coumadin, CHADS2VASC=3 (CHF, age x 2), suprapubic cath  Phone notes reviewed regarding chest pain, appointment made.   Howard Torres Bittel Jr. presents for cardiology evaluation.   Legs have been swelling for months, they are at a low level today. He weighs himself, not daily. He has lost a lot of weight in the last few years, not trying. He is down 56 lbs since 2010. He is not active. He feels sick at times, the feeling is in his whole trunk. He coughs at times. He wheezes at times. He has been coughing up blood at times. Hemoptysis has been going on for about 3 weeks. He has not had fevers or chills.  He got chest pain at 1 am on Sunday. He was up, sitting at a table. It was beneath his L breast. It gradually went to his R chest. 7/10, it radiated to his L shoulder. He describes a stabbing, cutting and pressure type of pain. He was SOB, thinks it hurt worse to breathe. He felt nauseated and was diaphoretic. He took SL NTG x 1 and it helped the pain. The chest pain eased but the pain in his L shoulder lingered at a low level. The chest pain lasted about 2 hours. He did not get much sleep. He did nothing on Sunday, was able to eat a little. Felt a little better on Monday, but was extremely weak.   He has had other episodes, less than 10 altogether. They have occurred in the past 6 weeks or so. The other episodes were similar, but not this bad. He has never taken SL NTG before.   Right now, he is not having pain and his weakness is at baseline. He  denies melena, hematochezia, BRBPR.  His main concern is his daughter. He feels he cannot go to the hospital because he needs to care for her.   He has a living will and his daughter is his HCPOA. He wants everything done until she says "pull the plug".   Past Medical History:  Diagnosis Date  . Chest pain syndrome   . CHF (congestive heart failure) (HCC)   . COPD (chronic obstructive pulmonary disease) (HCC)   . Dyslipidemia   . GERD (gastroesophageal reflux disease)   . OSA (obstructive sleep apnea)   . Pacemaker   . Permanent atrial fibrillation (HCC)   . Pulmonary hypertension (HCC)     Past Surgical History:  Procedure Laterality Date  . HERNIA REPAIR    . NM MYOCAR PERF WALL MOTION  11/25/2008   normal  . PERMANENT PACEMAKER INSERTION  11/16/2009   medtronic  . PROSTATE SURGERY      Current Outpatient Prescriptions  Medication Sig Dispense Refill  . albuterol (PROVENTIL HFA;VENTOLIN HFA) 108 (90 BASE) MCG/ACT inhaler Inhale 2 puffs into the lungs every 6 (six) hours as needed for wheezing or shortness of breath.    Marland Kitchen. aspirin 81 MG chewable tablet  Chew 81 mg by mouth daily.    . cyanocobalamin (,VITAMIN B-12,) 1000 MCG/ML injection Inject 1,000 mcg into the muscle every 30 (thirty) days.    . diazepam (VALIUM) 10 MG tablet Take 10 mg by mouth every 6 (six) hours as needed for anxiety.    . furosemide (LASIX) 40 MG tablet Take 40 mg by mouth daily. May take extra tab if needed    . ketoconazole (NIZORAL) 2 % shampoo Apply 1 application topically every other day.     . mometasone (NASONEX) 50 MCG/ACT nasal spray Place 2 sprays into the nose daily.    . nitroGLYCERIN (NITROSTAT) 0.4 MG SL tablet Place 1 tablet (0.4 mg total) under the tongue every 5 (five) minutes as needed for chest pain. 25 tablet 3  . omeprazole (PRILOSEC) 20 MG capsule Take 20 mg by mouth daily.    Marland Kitchen oxyCODONE-acetaminophen (PERCOCET) 10-325 MG per tablet Take 1 tablet by mouth every 6 (six) hours as  needed for pain.     . potassium chloride (K-DUR,KLOR-CON) 10 MEQ tablet Take 10 mEq by mouth daily as needed.     . rosuvastatin (CRESTOR) 20 MG tablet Take 20 mg by mouth daily.    . sodium chloride (OCEAN) 0.65 % nasal spray Place 1 spray into the nose as needed for congestion.    . Vitamin D, Ergocalciferol, (DRISDOL) 50000 UNITS CAPS Take 50,000 Units by mouth every 7 (seven) days. Wednesdays.    Marland Kitchen warfarin (COUMADIN) 5 MG tablet TAKE 1 TO 1 AND 1/2 TABLETS BY MOUTH DAILY AS DIRECTED BY COUMADIN CLINIC 45 tablet 5   No current facility-administered medications for this visit.     Allergies:   Patient has no known allergies.    Social History:  The patient  reports that he has been smoking Cigarettes.  He has a 65.00 pack-year smoking history. He has never used smokeless tobacco. He reports that he does not drink alcohol or use drugs.   Family History:  The patient's family history is not on file.    ROS:  Please see the history of present illness. All other systems are reviewed and negative.    PHYSICAL EXAM: VS:  BP 124/68   Pulse 60   Ht 5' 7.5" (1.715 m)   Wt 191 lb (86.6 kg)   BMI 29.47 kg/m  , BMI Body mass index is 29.47 kg/m. GEN: Well nourished, well developed, male in no acute distress  HEENT: normal for age  Neck: mild JVD, no carotid bruit, no masses Cardiac: RRR; 2/6 murmur, no rubs, or gallops Respiratory:  Decreased BS bases bilaterally, normal work of breathing GI: soft, nontender, nondistended, + BS MS: no deformity or atrophy; no edema; distal pulses are 2+ in all 4 extremities   Skin: warm and dry, no rash Neuro:  Strength and sensation are intact Psych: euthymic mood, full affect   EKG:  EKG is ordered today. The ekg ordered today demonstrates atrial fib, V pacing  ECHO: 07/29/2013 - Left ventricle: The cavity size was normal. Wall thickness was increased in a pattern of mild LVH. Systolic function was normal. The estimated ejection fraction  was in the range of 55% to 60%. Wall motion was normal; there were no regional wall motion abnormalities. - Aortic valve: There was trivial regurgitation. - Mitral valve: Calcified annulus. - Left atrium: The atrium was moderately dilated. - Right ventricle: The cavity size was mildly dilated. - Right atrium: The atrium was moderately dilated. - Pulmonary arteries: Systolic  pressure was mildly increased. PA peak pressure: 39 mm Hg (S). Impressions: - There appears to be a calcified area in proximal aorta on apical images; suggest CTA or MRA to futher assess.   Recent Labs: No results found for requested labs within last 8760 hours.    Lipid Panel    Component Value Date/Time   CHOL 102 05/29/2014 1121   TRIG 114 05/29/2014 1121   HDL 41 05/29/2014 1121   CHOLHDL 2.5 05/29/2014 1121   VLDL 23 05/29/2014 1121   LDLCALC 38 05/29/2014 1121     Wt Readings from Last 3 Encounters:  08/16/16 191 lb (86.6 kg)  11/11/14 208 lb (94.3 kg)  05/29/14 214 lb (97.1 kg)     Other studies Reviewed: Additional studies/ records that were reviewed today include: Office notes, hospital records and testing.  ASSESSMENT AND PLAN:  1.  Chest pain: He has not been having consistent exertional symptoms, but he had significant pain and it lasted several hours. Improved with nitro. Check a troponin. Continue aspirin, sublingual nitroglycerin when necessary, and statin. He is not on a beta blocker because of his history of bradycardia requiring a pacemaker, but Will discuss with Dr. Royann Shivers if he is positive for CAD.  2. Atrial fib w/ slow VR s/p MDT PPM: Patient device was not interrogated today but he is the pacing on his ECG. We will get it interrogated the device clinic when he comes in for his echocardiogram and try to get him back on track.  3. PAH: His last echo was in 2015. Because of the chest pain and his history of PAH, we will repeat it  4. Hemoptysis: He has a home machine to  check to check his INR and this is followed by the Coumadin clinic here. He does not report any extreme high or low values. However, he has been having hemoptysis for several weeks. According to his son, sometimes he just coughs up blood and sometimes he coughs up blood mixed with phlegm. We will check a chest x-ray.   Current medicines are reviewed at length with the patient today.  The patient does not have concerns regarding medicines.  The following changes have been made:  no change  Labs/ tests ordered today include:   Orders Placed This Encounter  Procedures  . DG Chest 2 View  . Comprehensive metabolic panel  . CBC  . Troponin I  . Protime-INR  . TSH  . EKG 12-Lead  . ECHOCARDIOGRAM COMPLETE     Disposition:   FU with Dr. Royann Shivers  Signed, Theodore Demark, PA-Torres  08/16/2016 5:14 PM    [facility] Phone: [phone]; Fax: [phone]  This note was written with the assistance of speech recognition software. Please excuse any transcriptional errors.

## 2016-08-17 LAB — COMPREHENSIVE METABOLIC PANEL
A/G RATIO: 1.5 (ref 1.2–2.2)
ALBUMIN: 4 g/dL (ref 3.5–4.8)
ALT: 12 IU/L (ref 0–44)
AST: 17 IU/L (ref 0–40)
Alkaline Phosphatase: 57 IU/L (ref 39–117)
BUN / CREAT RATIO: 24 (ref 10–24)
BUN: 26 mg/dL (ref 8–27)
Bilirubin Total: 0.6 mg/dL (ref 0.0–1.2)
CALCIUM: 9.3 mg/dL (ref 8.6–10.2)
CHLORIDE: 92 mmol/L — AB (ref 96–106)
CO2: 34 mmol/L — ABNORMAL HIGH (ref 20–29)
Creatinine, Ser: 1.09 mg/dL (ref 0.76–1.27)
GFR, EST AFRICAN AMERICAN: 75 mL/min/{1.73_m2} (ref >59)
GFR, EST NON AFRICAN AMERICAN: 65 mL/min/{1.73_m2} (ref >59)
Globulin, Total: 2.6 g/dL (ref 1.5–4.5)
Glucose: 94 mg/dL (ref 65–99)
POTASSIUM: 5 mmol/L (ref 3.5–5.2)
Sodium: 138 mmol/L (ref 134–144)
Total Protein: 6.6 g/dL (ref 6.0–8.5)

## 2016-08-17 LAB — CBC
HEMATOCRIT: 41.9 % (ref 37.5–51.0)
HEMOGLOBIN: 13.3 g/dL (ref 13.0–17.7)
MCH: 28.4 pg (ref 26.6–33.0)
MCHC: 31.7 g/dL (ref 31.5–35.7)
MCV: 89 fL (ref 79–97)
Platelets: 171 10*3/uL (ref 150–379)
RBC: 4.69 x10E6/uL (ref 4.14–5.80)
RDW: 15.1 % (ref 12.3–15.4)
WBC: 7.5 10*3/uL (ref 3.4–10.8)

## 2016-08-17 LAB — TROPONIN I: Troponin I: 0.04 ng/mL (ref 0.00–0.04)

## 2016-08-17 LAB — TSH: TSH: 3.78 u[IU]/mL (ref 0.450–4.500)

## 2016-08-17 LAB — PROTIME-INR
INR: 1.5 — ABNORMAL HIGH (ref 0.8–1.2)
Prothrombin Time: 16 s — ABNORMAL HIGH (ref 9.1–12.0)

## 2016-08-17 NOTE — Progress Notes (Signed)
Thanks. No beta blocker to limit need for V pacing. Will f/u on CXR and labs

## 2016-08-17 NOTE — Progress Notes (Signed)
CXR with cardiomegaly, no CHF, no clear acute infiltrate. INR subtherapeutic. Clinically, did it sound compatible with a pulmonary infarction? We could check a CT angio. MCr

## 2016-08-18 NOTE — Progress Notes (Signed)
CXR not very helpful. Symptoms could be related to severe PAH. Will wait for echo. Beta blockers are not contraindicated, but were avoided since he was already V pacing a lot when last seen. Will wait for device check. Thanks,  [facility]

## 2016-08-24 ENCOUNTER — Encounter: Payer: Self-pay | Admitting: Cardiovascular Disease

## 2016-08-24 ENCOUNTER — Ambulatory Visit (INDEPENDENT_AMBULATORY_CARE_PROVIDER_SITE_OTHER): Payer: Medicare Other | Admitting: Cardiovascular Disease

## 2016-08-24 VITALS — BP 122/60 | HR 68 | Ht 67.0 in | Wt 192.6 lb

## 2016-08-24 DIAGNOSIS — I4891 Unspecified atrial fibrillation: Secondary | ICD-10-CM | POA: Diagnosis not present

## 2016-08-24 DIAGNOSIS — I700 Atherosclerosis of aorta: Secondary | ICD-10-CM

## 2016-08-24 DIAGNOSIS — I482 Chronic atrial fibrillation, unspecified: Secondary | ICD-10-CM

## 2016-08-24 DIAGNOSIS — E7800 Pure hypercholesterolemia, unspecified: Secondary | ICD-10-CM | POA: Diagnosis not present

## 2016-08-24 DIAGNOSIS — Z950 Presence of cardiac pacemaker: Secondary | ICD-10-CM

## 2016-08-24 DIAGNOSIS — I2721 Secondary pulmonary arterial hypertension: Secondary | ICD-10-CM | POA: Diagnosis not present

## 2016-08-24 DIAGNOSIS — I208 Other forms of angina pectoris: Secondary | ICD-10-CM | POA: Diagnosis not present

## 2016-08-24 DIAGNOSIS — J449 Chronic obstructive pulmonary disease, unspecified: Secondary | ICD-10-CM

## 2016-08-24 DIAGNOSIS — I2089 Other forms of angina pectoris: Secondary | ICD-10-CM

## 2016-08-24 DIAGNOSIS — R042 Hemoptysis: Secondary | ICD-10-CM

## 2016-08-24 DIAGNOSIS — Z7901 Long term (current) use of anticoagulants: Secondary | ICD-10-CM | POA: Diagnosis not present

## 2016-08-24 DIAGNOSIS — I209 Angina pectoris, unspecified: Secondary | ICD-10-CM

## 2016-08-24 MED ORDER — NITROGLYCERIN 0.4 MG SL SUBL: 0.4000 mg | SUBLINGUAL_TABLET | SUBLINGUAL | 3 refills | Status: AC | PRN

## 2016-08-24 MED ORDER — ISOSORBIDE MONONITRATE ER 30 MG PO TB24: 30.0000 mg | ORAL_TABLET | Freq: Every day | ORAL | 3 refills | Status: DC

## 2016-08-24 NOTE — Progress Notes (Signed)
Patient ID: Howard CombesWalter C Wagenaar Jr., male   DOB: 02/10/1936, 81 y.o.   MRN: [id number]     Cardiology Office Note   Date:  08/26/2016   ID:  Howard CombesWalter C Zukowski Jr., DOB 06/17/1935, MRN [id number]  PCP:  Howard PufferLittle, James, MD  Cardiologist:   Howard FairMihai Kaho Selle, MD   No chief complaint on file.     History of Present Illness: Howard CombesWalter C Wenger Jr. is a 81 y.o. male who presents for chest pain, permanent atrial fibrillation, pulmonary hypertension, pacemaker check.  [name] has single chamber Medtronic Adapta permanent pacemaker implant in 2011 for atrial fibrillation with slow ventricular response.   Last week he had hemoptysis for about for 5 days. Now it's almost completely resolved. Anticoagulation was actually subtherapeutic at the time. He has also been complaining of dull, non pleuritic left-sided chest discomfort, but this occurred separate from the episode of hemoptysis. It can occur at rest, with more frequently happens with light activity. It appears to promptly resolve with sublingual nitroglycerin. He continues to insist on the most conservative possible evaluation, but states that if heart catheterization is clearly indicated, he would go ahead with it. His chest x-ray shows cardiomegaly without heart failure or pulmonary mass. There is some bilateral atelectasis/scar. He has prominent aortic atherosclerosis.  His last evaluation for coronary disease was a normal nuclear stress test in 2010. He has had repeated complaints of chest pain over the years and we have tried to schedule him for additional testing, but he has never shown up for those procedures. He has normal left ventricular systolic function and has not had problems with overt heart failure.  He has long-standing atrial fibrillation on warfarin anticoagulation. He has never had a known embolic event and does not have a history of DVT or PE. He has a chronic suprapubic catheter and is very sedentary. He has COPD and wears oxygen at home.  He also has obstructive sleep apnea. He has moderate pulmonary hypertension with an estimated systolic PA pressure of about 50 mm HG by echo.  Today's pacemaker interrogation (Medtronic Adapta) shows normal device function. His device is programmed VVIR. He has roughly 60% ventricular pacing with rare episodes of high ventricular response some of which are clearly nonsustained VT. All of them are brief. Most recent episode of high ventricular rate was recorded on May 1. Today he has 100% ventricular pacing and his underlying ventricular rate is only 40 bpm. Generator longevity is estimated at 3 years.  Echocardiogram performed today after this appointment showed normal left ventricular systolic function. EF 50-55% There is an isolated apical septal area of hypokinesis, probably artifact related to right ventricular apical pacing. He has mild aortic valve stenosis. The pulmonary artery pressure was again estimated at around 50 mmHg. Both atria are moderately dilated. There is no pericardial effusion.  Past Medical History:  Diagnosis Date  . Chest pain syndrome   . CHF (congestive heart failure) (HCC)   . COPD (chronic obstructive pulmonary disease) (HCC)   . Dyslipidemia   . GERD (gastroesophageal reflux disease)   . OSA (obstructive sleep apnea)   . Pacemaker   . Permanent atrial fibrillation (HCC)   . Pulmonary hypertension (HCC)     Past Surgical History:  Procedure Laterality Date  . HERNIA REPAIR    . NM MYOCAR PERF WALL MOTION  11/25/2008   normal  . PERMANENT PACEMAKER INSERTION  11/16/2009   medtronic  . PROSTATE SURGERY       Current Outpatient  Prescriptions  Medication Sig Dispense Refill  . albuterol (PROVENTIL HFA;VENTOLIN HFA) 108 (90 BASE) MCG/ACT inhaler Inhale 2 puffs into the lungs every 6 (six) hours as needed for wheezing or shortness of breath.    . ALPRAZolam (XANAX) 1 MG tablet Take 0.5-1 mg by mouth daily as needed for anxiety.    Marland Kitchen aspirin 81 MG chewable  tablet Chew 81 mg by mouth daily.    . cyanocobalamin (,VITAMIN B-12,) 1000 MCG/ML injection Inject 1,000 mcg into the muscle every 30 (thirty) days.    . furosemide (LASIX) 40 MG tablet Take 40 mg by mouth daily. May take extra tab if needed    . ketoconazole (NIZORAL) 2 % shampoo Apply 1 application topically every other day.     . nitroGLYCERIN (NITROSTAT) 0.4 MG SL tablet Place 1 tablet (0.4 mg total) under the tongue every 5 (five) minutes as needed for chest pain. 25 tablet 3  . omeprazole (PRILOSEC) 20 MG capsule Take 20 mg by mouth daily.    Marland Kitchen oxyCODONE-acetaminophen (PERCOCET) 10-325 MG per tablet Take 1 tablet by mouth every 6 (six) hours as needed for pain.     . potassium chloride (K-DUR,KLOR-CON) 10 MEQ tablet Take 10 mEq by mouth daily as needed.     . rosuvastatin (CRESTOR) 20 MG tablet Take 20 mg by mouth daily.    . sodium chloride (OCEAN) 0.65 % nasal spray Place 1 spray into the nose as needed for congestion.    . Vitamin D, Ergocalciferol, (DRISDOL) 50000 UNITS CAPS Take 50,000 Units by mouth every 7 (seven) days. Wednesdays.    Marland Kitchen warfarin (COUMADIN) 5 MG tablet TAKE 1 TO 1 AND 1/2 TABLETS BY MOUTH DAILY AS DIRECTED BY COUMADIN CLINIC 45 tablet 5  . isosorbide mononitrate (IMDUR) 30 MG 24 hr tablet Take 1 tablet (30 mg total) by mouth daily. 90 tablet 3   No current facility-administered medications for this visit.     Allergies:   Patient has no known allergies.    Social History:  The patient  reports that he has been smoking Cigarettes.  He has a 65.00 pack-year smoking history. He has never used smokeless tobacco. He reports that he does not drink alcohol or use drugs.   Family History:  The patient's FHx is not contributory  ROS:  Please see the history of present illness.    Otherwise, review of systems positive for none.   All other systems are reviewed and negative.    PHYSICAL EXAM: VS:  BP 122/60 (BP Location: Right Arm, Patient Position: Sitting, Cuff  Size: Normal)   Pulse 68   Ht 5\' 7"  (1.702 m)   Wt 192 lb 9.6 oz (87.4 kg)   BMI 30.17 kg/m  , BMI Body mass index is 30.17 kg/m.  General: Alert, oriented x3, no distress  Head: no evidence of trauma, PERRL, EOMI, no exophtalmos or lid lag, no myxedema, no xanthelasma; normal ears, nose and oropharynx  Neck: normal jugular venous pulsations and no hepatojugular reflux; brisk carotid pulses without delay and no carotid bruits  Chest: clear to auscultation, no signs of consolidation by percussion or palpation, normal fremitus, symmetrical and full respiratory excursions; healthy left subclavian pacemaker site  Cardiovascular: normal position and quality of the apical impulse, regular rhythm, normal first and paradoxically split second heart sounds, 2/6 low systolic left lower sternal border murmur, no rubs or gallops  Abdomen: no tenderness or distention, no masses by palpation, no abnormal pulsatility or arterial bruits, normal bowel  sounds, no hepatosplenomegaly  Extremities: no clubbing, cyanosis or edema; 2+ radial, ulnar and brachial pulses bilaterally; 2+ right femoral, posterior tibial and dorsalis pedis pulses; 2+ left femoral, posterior tibial and dorsalis pedis pulses; no subclavian or femoral bruits  Neurological: grossly nonfocal  Psych: depressed mood, full affect   EKG:  EKG is ordered today. The ekg ordered today demonstrates atrial flutter with variable AV block   Recent Labs: 08/16/2016: ALT 12; BUN 26; Creatinine, Ser 1.09; Hemoglobin 13.3; Platelets 171; Potassium 5.0; Sodium 138; TSH 3.780    Lipid Panel    Component Value Date/Time   CHOL 102 05/29/2014 1121   TRIG 114 05/29/2014 1121   HDL 41 05/29/2014 1121   CHOLHDL 2.5 05/29/2014 1121   VLDL 23 05/29/2014 1121   LDLCALC 38 05/29/2014 1121      Wt Readings from Last 3 Encounters:  08/24/16 192 lb 9.6 oz (87.4 kg)  08/16/16 191 lb (86.6 kg)  11/11/14 208 lb (94.3 kg)    ASSESSMENT AND  PLAN:  1. Chronic atrial fibrillation (HCC)   2. Hemoptysis   3. Stable angina pectoris (HCC)   4. COPD GOLD 0   5. PAH (pulmonary artery hypertension) (HCC)   6. Hypercholesterolemia   7. Aortic atherosclerosis (HCC)   8. Pacemaker   9. Long term (current) use of anticoagulants      1.  AFib: Satisfactory rate control, in fact today with slow ventricular response at 40 bpm. Appropriate anticoagulation with warfarin, recently subtherapeutic.  2. Hemoptysis: Pulmonary embolism/infarction as a cause of his hemoptysis and chest pain is considered. However the chest pain in the hemoptysis.occur synchronously. She does not have any peripheral evidence of DVT by exam. His echocardiogram shows no change in estimated pulmonary artery pressure or right ventricular function. He does not have a history of venous thrombotic embolic disease. Adjustments made to warfarin dose to achieve therapeutic levels. The hemoptysis has resolved spontaneously. Since he will need long-term anticoagulation anyway, I don't think further evaluation is absolute necessary at this time. It's possible that he had bronchitis related hemoptysis. If his cardiac evaluation is benign, will refer to pulmonary specialist since he is at risk for neoplastic lung disease. 3. Chest pain: This is not typical for angina pectoris, but improved with sublingual nitroglycerin and he is definitely at at risk for coronary artery disease. He may have stable angina pectoris, CCS class III . Start long-acting nitrates. He prefers conservative management if possible.. Since he does not have a ventricular dysfunction will set up for a nuclear stress test. Only plan angiography if there is clear evidence of reversible ischemia. He is not a candidate for surgical revascularization due to advanced lung disease. He might be an acceptable candidate for percutaneous repass physician/stent if limited coronary disease is identified. 4. COPD: On home oxygen 5.  PAH/Cor pulmonale: On diuretics for lower extremity edema, likely related to right heart failure, appears euvolemic and well compensated. Pulmonary artery hypertension may also cause chest pain on exertion that would improve with sublingual nitroglycerin. 6. HLP: On a highly effective statin, excellent LDL level when last checked. We'll get updated labs from Dr. Nicholos Johns. 7. Ao atherosclerosis on chest x-ray, marker of high likelihood of significant CAD 8. PPM: Normal device function. No reprogramming changes were necessary today. 9. Warfarin: Dose adjusted to achieve target INR 2.5  Current medicines are reviewed at length with the patient today.  The patient does not have concerns regarding medicines.  Labs/ tests ordered today include:  No orders of the defined types were placed in this encounter.  Patient Instructions  Dr Royann Shivers has recommended making the following medication changes: 1. START Isosorbide 30 mg - take 1 tablet by mouth daily  Your physician recommends that you schedule a follow-up appointment in 3 months with Dr Royann Shivers.  If you need a refill on your cardiac medications before your next appointment, please call your pharmacy.   Signed, Howard Fair, MD  08/26/2016 3:09 PM    Howard Fair, MD, [facility] [phone] office [phone] pager

## 2016-08-24 NOTE — Patient Instructions (Signed)
Dr Royann Shiversroitoru has recommended making the following medication changes: 1. START Isosorbide 30 mg - take 1 tablet by mouth daily  Your physician recommends that you schedule a follow-up appointment in 3 months with Dr Royann Shiversroitoru.  If you need a refill on your cardiac medications before your next appointment, please call your pharmacy.

## 2016-08-25 ENCOUNTER — Other Ambulatory Visit: Payer: Self-pay

## 2016-08-25 ENCOUNTER — Ambulatory Visit (HOSPITAL_COMMUNITY): Payer: Medicare Other | Attending: Cardiology

## 2016-08-25 DIAGNOSIS — I080 Rheumatic disorders of both mitral and aortic valves: Secondary | ICD-10-CM | POA: Insufficient documentation

## 2016-08-25 DIAGNOSIS — I2721 Secondary pulmonary arterial hypertension: Secondary | ICD-10-CM | POA: Diagnosis present

## 2016-08-26 DIAGNOSIS — I2721 Secondary pulmonary arterial hypertension: Secondary | ICD-10-CM | POA: Insufficient documentation

## 2016-08-26 DIAGNOSIS — I700 Atherosclerosis of aorta: Secondary | ICD-10-CM | POA: Insufficient documentation

## 2016-08-29 ENCOUNTER — Ambulatory Visit (INDEPENDENT_AMBULATORY_CARE_PROVIDER_SITE_OTHER): Payer: Medicare Other | Admitting: Pharmacist Clinician (PhC)/ Clinical Pharmacy Specialist

## 2016-08-29 DIAGNOSIS — I4892 Unspecified atrial flutter: Secondary | ICD-10-CM

## 2016-08-29 DIAGNOSIS — Z7901 Long term (current) use of anticoagulants: Secondary | ICD-10-CM

## 2016-08-29 LAB — POCT INR: INR: 1.7

## 2016-08-30 ENCOUNTER — Telehealth: Payer: Self-pay | Admitting: Cardiovascular Disease

## 2016-08-30 NOTE — Telephone Encounter (Signed)
Returning Nathan's call from 08-22-16.

## 2016-08-31 ENCOUNTER — Other Ambulatory Visit: Payer: Self-pay

## 2016-08-31 NOTE — Telephone Encounter (Signed)
I have contacted patient - results f/u.

## 2016-09-14 ENCOUNTER — Ambulatory Visit (INDEPENDENT_AMBULATORY_CARE_PROVIDER_SITE_OTHER): Payer: Medicare Other | Admitting: Pharmacist Clinician (PhC)/ Clinical Pharmacy Specialist

## 2016-09-14 DIAGNOSIS — I4892 Unspecified atrial flutter: Secondary | ICD-10-CM

## 2016-09-14 DIAGNOSIS — Z7901 Long term (current) use of anticoagulants: Secondary | ICD-10-CM

## 2016-09-14 LAB — POCT INR: INR: 1.7

## 2016-09-29 ENCOUNTER — Other Ambulatory Visit: Payer: Self-pay | Admitting: Cardiovascular Disease

## 2016-09-30 ENCOUNTER — Ambulatory Visit (INDEPENDENT_AMBULATORY_CARE_PROVIDER_SITE_OTHER): Payer: Self-pay | Admitting: Pharmacist

## 2016-09-30 DIAGNOSIS — Z7901 Long term (current) use of anticoagulants: Secondary | ICD-10-CM

## 2016-09-30 DIAGNOSIS — I4892 Unspecified atrial flutter: Secondary | ICD-10-CM

## 2016-09-30 LAB — PROTIME-INR: INR: 2 — AB (ref 0.9–1.1)

## 2016-10-04 LAB — CUP PACEART INCLINIC DEVICE CHECK
Battery Impedance: 2022 Ohm
Battery Voltage: 2.75 V
Brady Statistic RV Percent Paced: 60.2 %
Date Time Interrogation Session: 20180821122653
Implantable Lead Model: 5076
Lead Channel Impedance Value: 562 Ohm
Lead Channel Pacing Threshold Amplitude: 0.75 V
Lead Channel Setting Pacing Amplitude: 2.5 V
Lead Channel Setting Sensing Sensitivity: 2 mV
MDC IDC LEAD IMPLANT DT: 20111003
MDC IDC LEAD LOCATION: 753860
MDC IDC MSMT BATTERY REMAINING LONGEVITY: 36 mo
MDC IDC MSMT LEADCHNL RV PACING THRESHOLD PULSEWIDTH: 0.4 ms
MDC IDC MSMT LEADCHNL RV SENSING INTR AMPL: 8 mV
MDC IDC PG IMPLANT DT: 20111003
MDC IDC SET LEADCHNL RV PACING PULSEWIDTH: 0.4 ms

## 2016-10-07 ENCOUNTER — Encounter: Payer: Self-pay | Admitting: Cardiovascular Disease

## 2016-10-07 ENCOUNTER — Ambulatory Visit (INDEPENDENT_AMBULATORY_CARE_PROVIDER_SITE_OTHER): Payer: Medicare Other | Admitting: Pharmacist Clinician (PhC)/ Clinical Pharmacy Specialist

## 2016-10-07 DIAGNOSIS — Z7901 Long term (current) use of anticoagulants: Secondary | ICD-10-CM

## 2016-10-07 DIAGNOSIS — I4892 Unspecified atrial flutter: Secondary | ICD-10-CM

## 2016-10-07 LAB — POCT INR: INR: 2

## 2016-10-07 LAB — PROTIME-INR

## 2016-10-13 ENCOUNTER — Telehealth: Payer: Self-pay | Admitting: Pharmacist Clinician (PhC)/ Clinical Pharmacy Specialist

## 2016-10-13 NOTE — Telephone Encounter (Signed)
Coumadin letter 

## 2016-10-25 ENCOUNTER — Ambulatory Visit (INDEPENDENT_AMBULATORY_CARE_PROVIDER_SITE_OTHER): Payer: Medicare Other | Admitting: Pharmacist Clinician (PhC)/ Clinical Pharmacy Specialist

## 2016-10-25 DIAGNOSIS — I4892 Unspecified atrial flutter: Secondary | ICD-10-CM | POA: Diagnosis not present

## 2016-10-25 DIAGNOSIS — Z7901 Long term (current) use of anticoagulants: Secondary | ICD-10-CM | POA: Diagnosis not present

## 2016-10-25 LAB — POCT INR: INR: 1.6

## 2016-10-31 ENCOUNTER — Ambulatory Visit: Payer: Medicare Other | Admitting: Cardiovascular Disease

## 2016-11-10 ENCOUNTER — Ambulatory Visit (INDEPENDENT_AMBULATORY_CARE_PROVIDER_SITE_OTHER): Payer: Medicare Other | Admitting: Pharmacist

## 2016-11-10 DIAGNOSIS — Z7901 Long term (current) use of anticoagulants: Secondary | ICD-10-CM

## 2016-11-10 DIAGNOSIS — I4892 Unspecified atrial flutter: Secondary | ICD-10-CM

## 2016-11-10 LAB — POCT INR: INR: 2.2

## 2016-11-23 ENCOUNTER — Encounter: Payer: Self-pay | Admitting: Cardiovascular Disease

## 2016-11-23 ENCOUNTER — Ambulatory Visit (INDEPENDENT_AMBULATORY_CARE_PROVIDER_SITE_OTHER): Payer: Medicare Other | Admitting: Cardiovascular Disease

## 2016-11-23 VITALS — BP 112/54 | HR 63 | Ht 67.0 in | Wt 194.0 lb

## 2016-11-23 DIAGNOSIS — I209 Angina pectoris, unspecified: Secondary | ICD-10-CM | POA: Diagnosis not present

## 2016-11-23 DIAGNOSIS — I4891 Unspecified atrial fibrillation: Secondary | ICD-10-CM | POA: Diagnosis not present

## 2016-11-23 DIAGNOSIS — I2721 Secondary pulmonary arterial hypertension: Secondary | ICD-10-CM

## 2016-11-23 DIAGNOSIS — Z950 Presence of cardiac pacemaker: Secondary | ICD-10-CM | POA: Diagnosis not present

## 2016-11-23 DIAGNOSIS — Z7901 Long term (current) use of anticoagulants: Secondary | ICD-10-CM | POA: Diagnosis not present

## 2016-11-23 DIAGNOSIS — R042 Hemoptysis: Secondary | ICD-10-CM | POA: Diagnosis not present

## 2016-11-23 DIAGNOSIS — I279 Pulmonary heart disease, unspecified: Secondary | ICD-10-CM | POA: Diagnosis not present

## 2016-11-23 DIAGNOSIS — E7800 Pure hypercholesterolemia, unspecified: Secondary | ICD-10-CM | POA: Diagnosis not present

## 2016-11-23 DIAGNOSIS — I700 Atherosclerosis of aorta: Secondary | ICD-10-CM | POA: Diagnosis not present

## 2016-11-23 NOTE — Patient Instructions (Signed)
Dr Royann Shivers has recommended making the following medication changes: 1. STOP Aspirin  Remote monitoring is used to monitor your Pacemaker of ICD from home. This monitoring reduces the number of office visits required to check your device to one time per year. It allows [place] to keep an eye on the functioning of your device to ensure it is working properly. You are scheduled for a device check from home on Wednesday, January 9th, 2019. You may send your transmission at any time that day. If you have a wireless device, the transmission will be sent automatically. After your physician reviews your transmission, you will receive a postcard with your next transmission date.  Dr Royann Shivers recommends that you schedule a follow-up appointment in 6 months with a pacemaker check. You will receive a reminder letter in the mail two months in advance. If you don't receive a letter, please call our office to schedule the follow-up appointment.  If you need a refill on your cardiac medications before your next appointment, please call your pharmacy.

## 2016-11-23 NOTE — Progress Notes (Signed)
Patient ID: Howard Combes., male   DOB: October 17, 1935, 81 y.o.   MRN: [id number]     Cardiology Office Note   Date:  11/25/2016   ID:  Howard Combes., DOB [birth year], MRN [id number]  PCP:  Aida Puffer, MD  Cardiologist:   Thurmon Fair, MD   No chief complaint on file.     History of Present Illness: Howard Torres. is a 81 y.o. male who presents for chest pain, permanent atrial fibrillation, there is stenosis, suspected coronary artery disease, pulmonary hypertension, pacemaker check.  Howard Torres has single chamber Medtronic Adapta permanent pacemaker implant in 2011 for atrial fibrillation with slow ventricular response.   His hemoptysis has resolved. He is still coughing. He still smokes 10-12 cigarettes a day. He has not had syncope but does feel occasionally dizzy, especially with his position changes. Denies palpitations and is unaware of arrhythmia. Does not have orthopnea or PND and denies leg edema here at has not had any chest pain. He remains extremely sedentary or at your his oxygen at home for COPD.  Frequency of chest discomfort requiring nitroglycerin has substantially decreased after we started isosorbide mononitrate. He has not taken nitroglycerin in about a month now.  He complains of swelling of the lower extremities and discoloration. He has a patch of brawny skin overlying the left medial malleolus, consistent with stasis dermatitis. He has not had any ulcerations. He has difficulty keeping his leg elevated, because this kinks his suprapubic catheter and causes urinary retention.  His last evaluation for coronary disease was a normal nuclear stress test in 2010. He has had repeated complaints of chest pain over the years and we have tried to schedule him for additional testing, but he has never shown up for those procedures. He has normal left ventricular systolic function and has not had problems with overt heart failure.  Today's pacemaker interrogation  (Medtronic Adapta) shows normal device function. His device is programmed VVIR. He has 97.6% ventricular pacing, increased from 60% at his last appointment. He has had occasional nonsustained ventricular tachycardia, roughly once a month, the longest one consisted about 14 beats at 226 bpm. This occurred on October 4 at 8 AM when he was already awake and was not symptomatic. Vice was implanted in 2011 and anticipated battery longevity is another 2.5 years   Past Medical History:  Diagnosis Date  . Chest pain syndrome   . CHF (congestive heart failure) (HCC)   . COPD (chronic obstructive pulmonary disease) (HCC)   . Dyslipidemia   . GERD (gastroesophageal reflux disease)   . OSA (obstructive sleep apnea)   . Pacemaker   . Permanent atrial fibrillation (HCC)   . Pulmonary hypertension (HCC)     Past Surgical History:  Procedure Laterality Date  . HERNIA REPAIR    . NM MYOCAR PERF WALL MOTION  11/25/2008   normal  . PERMANENT PACEMAKER INSERTION  11/16/2009   medtronic  . PROSTATE SURGERY       Current Outpatient Prescriptions  Medication Sig Dispense Refill  . albuterol (PROVENTIL HFA;VENTOLIN HFA) 108 (90 BASE) MCG/ACT inhaler Inhale 2 puffs into the lungs every 6 (six) hours as needed for wheezing or shortness of breath.    . ALPRAZolam (XANAX) 1 MG tablet Take 0.5-1 mg by mouth daily as needed for anxiety.    Marland Kitchen aspirin 81 MG chewable tablet Chew 81 mg by mouth daily.    . cyanocobalamin (,VITAMIN B-12,) 1000 MCG/ML injection Inject 1,000 mcg  into the muscle every 30 (thirty) days.    . furosemide (LASIX) 40 MG tablet Take 40 mg by mouth daily. May take extra tab if needed    . ketoconazole (NIZORAL) 2 % shampoo Apply 1 application topically every other day.     . nitroGLYCERIN (NITROSTAT) 0.4 MG SL tablet Place 1 tablet (0.4 mg total) under the tongue every 5 (five) minutes as needed for chest pain. 25 tablet 3  . omeprazole (PRILOSEC) 20 MG capsule Take 20 mg by mouth daily.      Marland Kitchen oxyCODONE-acetaminophen (PERCOCET) 10-325 MG per tablet Take 1 tablet by mouth every 6 (six) hours as needed for pain.     . potassium chloride (K-DUR,KLOR-CON) 10 MEQ tablet Take 10 mEq by mouth daily as needed.     . rosuvastatin (CRESTOR) 20 MG tablet Take 20 mg by mouth daily.    . sodium chloride (OCEAN) 0.65 % nasal spray Place 1 spray into the nose as needed for congestion.    . Vitamin D, Ergocalciferol, (DRISDOL) 50000 UNITS CAPS Take 50,000 Units by mouth every 7 (seven) days. Wednesdays.    Marland Kitchen warfarin (COUMADIN) 5 MG tablet TAKE 1 TO 1 AND 1/2 TABLETS BY MOUTH DAILY AS DIRECTED BY COUMADIN CLINIC 45 tablet 1  . isosorbide mononitrate (IMDUR) 30 MG 24 hr tablet Take 1 tablet (30 mg total) by mouth daily. 90 tablet 3   No current facility-administered medications for this visit.     Allergies:   Patient has no known allergies.    Social History:  The patient  reports that he has been smoking Cigarettes.  He has a 65.00 pack-year smoking history. He has never used smokeless tobacco. He reports that he does not drink alcohol or use drugs.   Family History:  The patient's FHx is not contributory  ROS:  Please see the history of present illness.    Otherwise, review of systems positive for none.   All other systems are reviewed and negative.    PHYSICAL EXAM: VS:  BP (!) 112/54   Pulse 63   Ht  (1.702 m)   Wt 194 lb (88 kg)   BMI 30.38 kg/m  , BMI Body mass index is 30.38 kg/m.   General: Alert, oriented x3, no distress, Mildly obese Head: no evidence of trauma, PERRL, EOMI, no exophtalmos or lid lag, no myxedema, no xanthelasma; normal ears, nose and oropharynx Neck: normal jugular venous pulsations and no hepatojugular reflux; brisk carotid pulses without delay and no carotid bruits Chest: clear to auscultation, no signs of consolidation by percussion or palpation, normal fremitus, symmetrical and full respiratory excursions Cardiovascular: normal position and  quality of the apical impulse, regular rhythm, normal first and second heart sounds, early peaking 2/6 aortic ejection murmur, no diastolic murmurs, rubs or gallops, the left subclavian pacemaker site Abdomen: no tenderness or distention, no masses by palpation, no abnormal pulsatility or arterial bruits, normal bowel sounds, no hepatosplenomegaly Extremities: Common bilateral varicose veins and chronic stasis dermatitis, 1-2+ ankle edema, symmetrical; no clubbing, cyanosis; 2+ radial, ulnar and brachial pulses bilaterally; 2+ right femoral, posterior tibial and dorsalis pedis pulses; 2+ left femoral, posterior tibial and dorsalis pedis pulses; no subclavian or femoral bruits Neurological: grossly nonfocal Psych: Normal mood and affect  EKG:  EKG is ordered today. ECG today shows ventricular paced rhythm at 63 bpm on a background of atrial fibrillation. The underlying rhythm is ventricular sensed at 40 bpm.   Recent Labs: 08/16/2016: ALT 12; BUN  26; Creatinine, Ser 1.09; Hemoglobin 13.3; Platelets 171; Potassium 5.0; Sodium 138; TSH 3.780    Lipid Panel    Component Value Date/Time   CHOL 102 05/29/2014 1121   TRIG 114 05/29/2014 1121   HDL 41 05/29/2014 1121   CHOLHDL 2.5 05/29/2014 1121   VLDL 23 05/29/2014 1121   LDLCALC 38 05/29/2014 1121      Wt Readings from Last 3 Encounters:  11/23/16 194 lb (88 kg)  08/24/16 192 lb 9.6 oz (87.4 kg)  08/16/16 191 lb (86.6 kg)    ASSESSMENT AND PLAN:  1. Atrial fibrillation with slow ventricular response (HCC)   2. Hemoptysis   3. Angina pectoris (HCC)   4. Chronic pulmonary heart disease (HCC)   5. PAH (pulmonary artery hypertension) (HCC)   6. Hypercholesterolemia   7. Aortic atherosclerosis (HCC)   8. Pacemaker   9. Long term (current) use of anticoagulants      1.  AFib: No problems with high ventricular rate. He has virtually 100% pacing without being on any rate control medications, he is not pacemaker dependent 2. Hemoptysis:  Has resolved. Discussed again the fact that because of his long history of smoking he is at risk for developing lung cancer. He may just have had bronchitis, but cannot exclude a neoplasm as a cause for his transient hemoptysis. He continues to prefer least aggressive approach to treatment. 3. Angina pectoris: There has been a marked reduction in his episodes of chest pain with nitrates, even though the pattern was rather atypical for angina. This appears to confirm the suspicion that he has underlying coronary disease, although pulmonary artery hypertension may have had a similar response. He does not want to undergo invasive evaluation. He would not be a good candidate for surgical revascularization due to the severity of his lung disease. Will continue with symptomatic treatment with long-acting nitrates. 4. COPD: Chronic respiratory failure with hypoxia, on home oxygen. He did not use that smoking is bad for him but does not want to really try to quit smoking. He says it's very hard 5. PAH/Cor pulmonale: Lower extremity edema is related both to right heart failure/cor pulmonale as well as severe peripheral venous insufficiency. He needs to try to keep his legs elevated as much as possible, especially the left leg where he has fairly advanced stasis dermatitis and appears to be at risk for development of a supramalleolar stasis ulcer.  6. HLP: On a highly effective statin, excellent LDL level when last checked. Checks labs with Dr. Nicholos Johns. 7. Ao atherosclerosis on chest x-ray, marker of high likelihood of significant CAD 8. PPM: Normal device function. He has almost 100% ventricular pacing due to spontaneously slow ventricular response, but is not really device dependent. No reprogramming changes were necessary today. Continue remote downloads every 3 months. 9. Warfarin: Subtherapeutic INR today, dose adjusted.  Current medicines are reviewed at length with the patient today.  The patient does not  have concerns regarding medicines.  Labs/ tests ordered today include:  No orders of the defined types were placed in this encounter.  Patient Instructions  Dr Royann Shivers has recommended making the following medication changes: 1. STOP Aspirin  Remote monitoring is used to monitor your Pacemaker of ICD from home. This monitoring reduces the number of office visits required to check your device to one time per year. It allows [place] to keep an eye on the functioning of your device to ensure it is working properly. You are scheduled for a device check  from home on Wednesday, January 9th, 2019. You may send your transmission at any time that day. If you have a wireless device, the transmission will be sent automatically. After your physician reviews your transmission, you will receive a postcard with your next transmission date.  Dr Royann Shivers recommends that you schedule a follow-up appointment in 6 months with a pacemaker check. You will receive a reminder letter in the mail two months in advance. If you don't receive a letter, please call our office to schedule the follow-up appointment.  If you need a refill on your cardiac medications before your next appointment, please call your pharmacy.   Signed, Thurmon Fair, MD  11/25/2016 5:51 PM    Thurmon Fair, MD, [facility] [phone] office [phone] pager

## 2016-11-25 ENCOUNTER — Ambulatory Visit (INDEPENDENT_AMBULATORY_CARE_PROVIDER_SITE_OTHER): Payer: Medicare Other | Admitting: Pharmacist Clinician (PhC)/ Clinical Pharmacy Specialist

## 2016-11-25 ENCOUNTER — Other Ambulatory Visit: Payer: Self-pay | Admitting: Cardiovascular Disease

## 2016-11-25 DIAGNOSIS — I209 Angina pectoris, unspecified: Secondary | ICD-10-CM | POA: Insufficient documentation

## 2016-11-25 DIAGNOSIS — Z7901 Long term (current) use of anticoagulants: Secondary | ICD-10-CM

## 2016-11-25 DIAGNOSIS — I4892 Unspecified atrial flutter: Secondary | ICD-10-CM | POA: Diagnosis not present

## 2016-11-25 LAB — POCT INR: INR: 1.5

## 2016-11-28 LAB — CUP PACEART INCLINIC DEVICE CHECK
Battery Impedance: 2290 Ohm
Battery Voltage: 2.74 V
Date Time Interrogation Session: 20181015160450
Implantable Lead Location: 753860
Implantable Pulse Generator Implant Date: 20111003
Lead Channel Pacing Threshold Pulse Width: 0.4 ms
Lead Channel Setting Pacing Amplitude: 2.5 V
MDC IDC LEAD IMPLANT DT: 20111003
MDC IDC MSMT BATTERY REMAINING LONGEVITY: 30 mo
MDC IDC MSMT LEADCHNL RV IMPEDANCE VALUE: 553 Ohm
MDC IDC MSMT LEADCHNL RV PACING THRESHOLD AMPLITUDE: 0.75 V
MDC IDC MSMT LEADCHNL RV SENSING INTR AMPL: 8 mV
MDC IDC SET LEADCHNL RV PACING PULSEWIDTH: 0.4 ms
MDC IDC SET LEADCHNL RV SENSING SENSITIVITY: 2 mV
MDC IDC STAT BRADY RV PERCENT PACED: 97.6 %

## 2016-11-28 LAB — POCT INR: INR: 1.5

## 2016-11-29 ENCOUNTER — Ambulatory Visit (INDEPENDENT_AMBULATORY_CARE_PROVIDER_SITE_OTHER): Payer: Medicare Other | Admitting: Pharmacist

## 2016-11-29 DIAGNOSIS — Z7901 Long term (current) use of anticoagulants: Secondary | ICD-10-CM

## 2016-11-29 DIAGNOSIS — I4891 Unspecified atrial fibrillation: Secondary | ICD-10-CM

## 2016-12-04 ENCOUNTER — Other Ambulatory Visit: Payer: Self-pay | Admitting: Cardiovascular Disease

## 2016-12-09 ENCOUNTER — Ambulatory Visit (INDEPENDENT_AMBULATORY_CARE_PROVIDER_SITE_OTHER): Payer: Medicare Other | Admitting: Pharmacist Clinician (PhC)/ Clinical Pharmacy Specialist

## 2016-12-09 DIAGNOSIS — Z7901 Long term (current) use of anticoagulants: Secondary | ICD-10-CM | POA: Diagnosis not present

## 2016-12-09 DIAGNOSIS — I4891 Unspecified atrial fibrillation: Secondary | ICD-10-CM | POA: Diagnosis not present

## 2016-12-09 LAB — POCT INR: INR: 1.8

## 2016-12-26 LAB — POCT INR: INR: 1.8

## 2016-12-28 ENCOUNTER — Ambulatory Visit (INDEPENDENT_AMBULATORY_CARE_PROVIDER_SITE_OTHER): Payer: Medicare Other | Admitting: Pharmacist Clinician (PhC)/ Clinical Pharmacy Specialist

## 2016-12-28 DIAGNOSIS — I4891 Unspecified atrial fibrillation: Secondary | ICD-10-CM

## 2016-12-28 DIAGNOSIS — Z7901 Long term (current) use of anticoagulants: Secondary | ICD-10-CM | POA: Diagnosis not present

## 2017-01-11 ENCOUNTER — Ambulatory Visit (INDEPENDENT_AMBULATORY_CARE_PROVIDER_SITE_OTHER): Payer: Medicare Other | Admitting: Pharmacist

## 2017-01-11 DIAGNOSIS — Z7901 Long term (current) use of anticoagulants: Secondary | ICD-10-CM

## 2017-01-11 DIAGNOSIS — I4891 Unspecified atrial fibrillation: Secondary | ICD-10-CM

## 2017-01-11 LAB — POCT INR: INR: 2

## 2017-01-25 ENCOUNTER — Ambulatory Visit (INDEPENDENT_AMBULATORY_CARE_PROVIDER_SITE_OTHER): Payer: Medicare Other | Admitting: Pharmacist Clinician (PhC)/ Clinical Pharmacy Specialist

## 2017-01-25 DIAGNOSIS — I4891 Unspecified atrial fibrillation: Secondary | ICD-10-CM

## 2017-01-25 DIAGNOSIS — Z7901 Long term (current) use of anticoagulants: Secondary | ICD-10-CM

## 2017-01-25 LAB — POCT INR: INR: 3.2

## 2017-01-31 ENCOUNTER — Ambulatory Visit (INDEPENDENT_AMBULATORY_CARE_PROVIDER_SITE_OTHER): Payer: Medicare Other | Admitting: Pharmacist Clinician (PhC)/ Clinical Pharmacy Specialist

## 2017-01-31 DIAGNOSIS — Z7901 Long term (current) use of anticoagulants: Secondary | ICD-10-CM | POA: Diagnosis not present

## 2017-01-31 DIAGNOSIS — I4891 Unspecified atrial fibrillation: Secondary | ICD-10-CM

## 2017-01-31 LAB — POCT INR: INR: 1.7

## 2017-02-08 ENCOUNTER — Other Ambulatory Visit: Payer: Self-pay

## 2017-02-08 ENCOUNTER — Emergency Department (HOSPITAL_COMMUNITY): Payer: Medicare Other

## 2017-02-08 ENCOUNTER — Inpatient Hospital Stay (HOSPITAL_COMMUNITY)
Admission: EM | Admit: 2017-02-08 | Discharge: 2017-02-11 | DRG: 292 | Disposition: A | Discharge status: Home Health | Payer: Medicare Other | Attending: Internal Medicine | Admitting: Internal Medicine

## 2017-02-08 ENCOUNTER — Encounter (HOSPITAL_COMMUNITY): Payer: Self-pay | Admitting: *Deleted

## 2017-02-08 DIAGNOSIS — I2510 Atherosclerotic heart disease of native coronary artery without angina pectoris: Secondary | ICD-10-CM | POA: Diagnosis present

## 2017-02-08 DIAGNOSIS — D696 Thrombocytopenia, unspecified: Secondary | ICD-10-CM | POA: Diagnosis present

## 2017-02-08 DIAGNOSIS — Z950 Presence of cardiac pacemaker: Secondary | ICD-10-CM | POA: Diagnosis not present

## 2017-02-08 DIAGNOSIS — J449 Chronic obstructive pulmonary disease, unspecified: Secondary | ICD-10-CM | POA: Diagnosis present

## 2017-02-08 DIAGNOSIS — I700 Atherosclerosis of aorta: Secondary | ICD-10-CM | POA: Diagnosis present

## 2017-02-08 DIAGNOSIS — F17200 Nicotine dependence, unspecified, uncomplicated: Secondary | ICD-10-CM | POA: Diagnosis not present

## 2017-02-08 DIAGNOSIS — R0600 Dyspnea, unspecified: Secondary | ICD-10-CM

## 2017-02-08 DIAGNOSIS — J9610 Chronic respiratory failure, unspecified whether with hypoxia or hypercapnia: Secondary | ICD-10-CM | POA: Diagnosis present

## 2017-02-08 DIAGNOSIS — F339 Major depressive disorder, recurrent, unspecified: Secondary | ICD-10-CM | POA: Diagnosis present

## 2017-02-08 DIAGNOSIS — I272 Pulmonary hypertension, unspecified: Secondary | ICD-10-CM

## 2017-02-08 DIAGNOSIS — I248 Other forms of acute ischemic heart disease: Secondary | ICD-10-CM | POA: Diagnosis present

## 2017-02-08 DIAGNOSIS — I5043 Acute on chronic combined systolic (congestive) and diastolic (congestive) heart failure: Secondary | ICD-10-CM | POA: Diagnosis present

## 2017-02-08 DIAGNOSIS — K7469 Other cirrhosis of liver: Secondary | ICD-10-CM | POA: Diagnosis not present

## 2017-02-08 DIAGNOSIS — K219 Gastro-esophageal reflux disease without esophagitis: Secondary | ICD-10-CM | POA: Diagnosis present

## 2017-02-08 DIAGNOSIS — Z9119 Patient's noncompliance with other medical treatment and regimen: Secondary | ICD-10-CM

## 2017-02-08 DIAGNOSIS — I279 Pulmonary heart disease, unspecified: Secondary | ICD-10-CM | POA: Diagnosis not present

## 2017-02-08 DIAGNOSIS — R011 Cardiac murmur, unspecified: Secondary | ICD-10-CM | POA: Diagnosis present

## 2017-02-08 DIAGNOSIS — I110 Hypertensive heart disease with heart failure: Principal | ICD-10-CM | POA: Diagnosis present

## 2017-02-08 DIAGNOSIS — F17210 Nicotine dependence, cigarettes, uncomplicated: Secondary | ICD-10-CM | POA: Diagnosis present

## 2017-02-08 DIAGNOSIS — K7460 Unspecified cirrhosis of liver: Secondary | ICD-10-CM | POA: Diagnosis present

## 2017-02-08 DIAGNOSIS — Z6829 Body mass index (BMI) 29.0-29.9, adult: Secondary | ICD-10-CM | POA: Diagnosis not present

## 2017-02-08 DIAGNOSIS — Z7901 Long term (current) use of anticoagulants: Secondary | ICD-10-CM

## 2017-02-08 DIAGNOSIS — E876 Hypokalemia: Secondary | ICD-10-CM | POA: Diagnosis present

## 2017-02-08 DIAGNOSIS — R609 Edema, unspecified: Secondary | ICD-10-CM

## 2017-02-08 DIAGNOSIS — Z9981 Dependence on supplemental oxygen: Secondary | ICD-10-CM

## 2017-02-08 DIAGNOSIS — I509 Heart failure, unspecified: Secondary | ICD-10-CM | POA: Diagnosis not present

## 2017-02-08 DIAGNOSIS — E669 Obesity, unspecified: Secondary | ICD-10-CM | POA: Diagnosis present

## 2017-02-08 DIAGNOSIS — E785 Hyperlipidemia, unspecified: Secondary | ICD-10-CM | POA: Diagnosis present

## 2017-02-08 DIAGNOSIS — I482 Chronic atrial fibrillation: Secondary | ICD-10-CM | POA: Diagnosis present

## 2017-02-08 DIAGNOSIS — I25118 Atherosclerotic heart disease of native coronary artery with other forms of angina pectoris: Secondary | ICD-10-CM | POA: Diagnosis not present

## 2017-02-08 DIAGNOSIS — G4733 Obstructive sleep apnea (adult) (pediatric): Secondary | ICD-10-CM | POA: Diagnosis present

## 2017-02-08 LAB — BASIC METABOLIC PANEL
ANION GAP: 9 (ref 5–15)
BUN: 24 mg/dL — ABNORMAL HIGH (ref 6–20)
CHLORIDE: 84 mmol/L — AB (ref 101–111)
CO2: 40 mmol/L — AB (ref 22–32)
Calcium: 8.5 mg/dL — ABNORMAL LOW (ref 8.9–10.3)
Creatinine, Ser: 1.2 mg/dL (ref 0.61–1.24)
GFR calc non Af Amer: 56 mL/min — ABNORMAL LOW (ref >60)
Glucose, Bld: 105 mg/dL — ABNORMAL HIGH (ref 65–99)
Potassium: 3.8 mmol/L (ref 3.5–5.1)
Sodium: 133 mmol/L — ABNORMAL LOW (ref 135–145)

## 2017-02-08 LAB — BRAIN NATRIURETIC PEPTIDE: B Natriuretic Peptide: 233.5 pg/mL — ABNORMAL HIGH (ref 0.0–100.0)

## 2017-02-08 LAB — HEPATIC FUNCTION PANEL
ALBUMIN: 3.3 g/dL — AB (ref 3.5–5.0)
ALT: 16 U/L — AB (ref 17–63)
AST: 27 U/L (ref 15–41)
Alkaline Phosphatase: 60 U/L (ref 38–126)
BILIRUBIN DIRECT: 0.4 mg/dL (ref 0.1–0.5)
BILIRUBIN TOTAL: 1.6 mg/dL — AB (ref 0.3–1.2)
Indirect Bilirubin: 1.2 mg/dL — ABNORMAL HIGH (ref 0.3–0.9)
Total Protein: 6.2 g/dL — ABNORMAL LOW (ref 6.5–8.1)

## 2017-02-08 LAB — CBC
HEMATOCRIT: 40.8 % (ref 39.0–52.0)
HEMOGLOBIN: 12.1 g/dL — AB (ref 13.0–17.0)
MCH: 27.1 pg (ref 26.0–34.0)
MCHC: 29.7 g/dL — ABNORMAL LOW (ref 30.0–36.0)
MCV: 91.3 fL (ref 78.0–100.0)
Platelets: 137 10*3/uL — ABNORMAL LOW (ref 150–400)
RBC: 4.47 MIL/uL (ref 4.22–5.81)
RDW: 15.2 % (ref 11.5–15.5)
WBC: 6.4 10*3/uL (ref 4.0–10.5)

## 2017-02-08 LAB — I-STAT TROPONIN, ED: Troponin i, poc: 0.03 ng/mL (ref 0.00–0.08)

## 2017-02-08 LAB — PROTIME-INR
INR: 1.76
PROTHROMBIN TIME: 20.4 s — AB (ref 11.4–15.2)

## 2017-02-08 LAB — TROPONIN I: Troponin I: 0.04 ng/mL (ref <0.03)

## 2017-02-08 MED ORDER — OXYCODONE HCL 5 MG PO TABS
5.0000 mg | ORAL_TABLET | Freq: Four times a day (QID) | ORAL | Status: DC | PRN
  Administered 2017-02-08 – 2017-02-11 (×9): 5 mg via ORAL
  Filled 2017-02-08 (×9): qty 1

## 2017-02-08 MED ORDER — ONDANSETRON HCL 4 MG/2ML IJ SOLN: 4.0000 mg | Freq: Four times a day (QID) | INTRAMUSCULAR | Status: DC | PRN

## 2017-02-08 MED ORDER — ALPRAZOLAM 0.5 MG PO TABS
0.5000 mg | ORAL_TABLET | Freq: Every day | ORAL | Status: DC | PRN
  Administered 2017-02-10: 0.5 mg via ORAL
  Filled 2017-02-08: qty 1

## 2017-02-08 MED ORDER — POTASSIUM CHLORIDE CRYS ER 20 MEQ PO TBCR
20.0000 meq | EXTENDED_RELEASE_TABLET | Freq: Three times a day (TID) | ORAL | Status: DC
  Administered 2017-02-09 – 2017-02-10 (×6): 20 meq via ORAL
  Filled 2017-02-08 (×7): qty 1

## 2017-02-08 MED ORDER — OXYCODONE-ACETAMINOPHEN 10-325 MG PO TABS: 1.0000 | ORAL_TABLET | Freq: Four times a day (QID) | ORAL | Status: DC | PRN

## 2017-02-08 MED ORDER — ROSUVASTATIN CALCIUM 20 MG PO TABS
20.0000 mg | ORAL_TABLET | Freq: Every day | ORAL | Status: DC
  Administered 2017-02-08 – 2017-02-10 (×3): 20 mg via ORAL
  Filled 2017-02-08 (×3): qty 1

## 2017-02-08 MED ORDER — NITROGLYCERIN 0.4 MG SL SUBL: 0.4000 mg | SUBLINGUAL_TABLET | SUBLINGUAL | Status: DC | PRN

## 2017-02-08 MED ORDER — WARFARIN SODIUM 7.5 MG PO TABS: 7.5000 mg | ORAL_TABLET | Freq: Once | ORAL | Status: DC

## 2017-02-08 MED ORDER — SODIUM CHLORIDE 0.9 % IV SOLN: 250.0000 mL | INTRAVENOUS | Status: DC | PRN

## 2017-02-08 MED ORDER — FENTANYL CITRATE (PF) 100 MCG/2ML IJ SOLN
100.0000 ug | Freq: Once | INTRAMUSCULAR | Status: AC
  Administered 2017-02-08: 100 ug via INTRAVENOUS
  Filled 2017-02-08: qty 2

## 2017-02-08 MED ORDER — SODIUM CHLORIDE 0.9% FLUSH: 3.0000 mL | INTRAVENOUS | Status: DC | PRN

## 2017-02-08 MED ORDER — FUROSEMIDE 10 MG/ML IJ SOLN
80.0000 mg | Freq: Four times a day (QID) | INTRAMUSCULAR | Status: DC
  Administered 2017-02-08 – 2017-02-09 (×3): 80 mg via INTRAVENOUS
  Filled 2017-02-08 (×3): qty 8

## 2017-02-08 MED ORDER — LOSARTAN POTASSIUM 25 MG PO TABS
25.0000 mg | ORAL_TABLET | Freq: Every day | ORAL | Status: DC
  Administered 2017-02-08 – 2017-02-10 (×3): 25 mg via ORAL
  Filled 2017-02-08 (×3): qty 1

## 2017-02-08 MED ORDER — WARFARIN SODIUM 7.5 MG PO TABS
7.5000 mg | ORAL_TABLET | Freq: Once | ORAL | Status: AC
  Administered 2017-02-08: 7.5 mg via ORAL
  Filled 2017-02-08: qty 1

## 2017-02-08 MED ORDER — ASPIRIN EC 81 MG PO TBEC
81.0000 mg | DELAYED_RELEASE_TABLET | Freq: Every day | ORAL | Status: DC
  Administered 2017-02-08 – 2017-02-10 (×2): 81 mg via ORAL
  Filled 2017-02-08 (×3): qty 1

## 2017-02-08 MED ORDER — ISOSORBIDE MONONITRATE ER 30 MG PO TB24
30.0000 mg | ORAL_TABLET | Freq: Every day | ORAL | Status: DC
Start: 2017-02-09 — End: 2017-02-11
  Administered 2017-02-09 – 2017-02-10 (×2): 30 mg via ORAL
  Filled 2017-02-08 (×2): qty 1

## 2017-02-08 MED ORDER — SODIUM CHLORIDE 0.9% FLUSH
3.0000 mL | Freq: Two times a day (BID) | INTRAVENOUS | Status: DC
  Administered 2017-02-08 – 2017-02-10 (×5): 3 mL via INTRAVENOUS

## 2017-02-08 MED ORDER — MAGNESIUM SULFATE 2 GM/50ML IV SOLN
2.0000 g | Freq: Once | INTRAVENOUS | Status: AC
  Administered 2017-02-08: 2 g via INTRAVENOUS
  Filled 2017-02-08: qty 50

## 2017-02-08 MED ORDER — SALINE SPRAY 0.65 % NA SOLN
1.0000 | NASAL | Status: DC | PRN
  Filled 2017-02-08: qty 44

## 2017-02-08 MED ORDER — IOPAMIDOL (ISOVUE-300) INJECTION 61%
INTRAVENOUS | Status: AC
  Administered 2017-02-08: 100 mL
  Filled 2017-02-08: qty 100

## 2017-02-08 MED ORDER — WARFARIN - PHARMACIST DOSING INPATIENT
Freq: Every day | Status: DC
  Administered 2017-02-10: 18:00:00

## 2017-02-08 MED ORDER — METOLAZONE 2.5 MG PO TABS
2.5000 mg | ORAL_TABLET | Freq: Two times a day (BID) | ORAL | Status: DC
  Administered 2017-02-09: 2.5 mg via ORAL
  Filled 2017-02-08: qty 1

## 2017-02-08 MED ORDER — OXYCODONE-ACETAMINOPHEN 5-325 MG PO TABS
1.0000 | ORAL_TABLET | Freq: Four times a day (QID) | ORAL | Status: DC | PRN
Start: 2017-02-08 — End: 2017-02-11
  Administered 2017-02-08 – 2017-02-11 (×9): 1 via ORAL
  Filled 2017-02-08 (×9): qty 1

## 2017-02-08 MED ORDER — PANTOPRAZOLE SODIUM 40 MG PO TBEC
40.0000 mg | DELAYED_RELEASE_TABLET | Freq: Every day | ORAL | Status: DC
Start: 2017-02-09 — End: 2017-02-11
  Administered 2017-02-09 – 2017-02-10 (×2): 40 mg via ORAL
  Filled 2017-02-08 (×2): qty 1

## 2017-02-08 MED ORDER — GUAIFENESIN ER 600 MG PO TB12
600.0000 mg | ORAL_TABLET | Freq: Two times a day (BID) | ORAL | Status: DC
  Administered 2017-02-08 – 2017-02-10 (×5): 600 mg via ORAL
  Filled 2017-02-08 (×5): qty 1

## 2017-02-08 MED ORDER — ACETAMINOPHEN 325 MG PO TABS: 650.0000 mg | ORAL_TABLET | ORAL | Status: DC | PRN

## 2017-02-08 MED ORDER — LEVALBUTEROL HCL 0.63 MG/3ML IN NEBU
0.6300 mg | INHALATION_SOLUTION | Freq: Four times a day (QID) | RESPIRATORY_TRACT | Status: DC | PRN
  Administered 2017-02-08: 0.63 mg via RESPIRATORY_TRACT
  Filled 2017-02-08: qty 3

## 2017-02-08 MED ORDER — ALBUTEROL SULFATE (2.5 MG/3ML) 0.083% IN NEBU: 5.0000 mg | INHALATION_SOLUTION | Freq: Once | RESPIRATORY_TRACT | Status: DC

## 2017-02-08 NOTE — ED Notes (Signed)
Patient family given update patient has bed will be transferred shortly

## 2017-02-08 NOTE — ED Notes (Signed)
Attempted report 

## 2017-02-08 NOTE — ED Notes (Signed)
Report given to 3E RN

## 2017-02-08 NOTE — ED Notes (Signed)
Admitted report.

## 2017-02-08 NOTE — Progress Notes (Signed)
Patient complains of being short of breath and 10/10 back pain due to "MRI downstairs".  Meds given

## 2017-02-08 NOTE — ED Triage Notes (Signed)
Pt reports testicular swelling x 8 days, states he has an inguinal hernia which he thinks is moving down.  He also reports worsening swelling in his BLE.  He endorses SOB which has worsened recently.  He uses home O2 d/t COPD. Pt denies cp at present but reports dizziness.

## 2017-02-08 NOTE — ED Notes (Signed)
MD aware of patient request for pain medication.  

## 2017-02-08 NOTE — H&P (Signed)
History and Physical:    Howard Torres.   ZOX:[id number] DOB: [birth year] DOA: 02/08/2017  Referring MD/provider: Doug Sou, MD PCP: Howard Puffer, MD   Patient coming from: Home  Chief Complaint: Dyspnea/leg swelling  History of Present Illness:   Howard Torres. is an 81 y.o. male with a PMH of OSA (has not used CPAP in 2 years), chronic respiratory failure on 2 L of home oxygen, chronic atrial fibrillation on Coumadin status post pacemaker, COPD, chronic diastolic CHF (EF 81-19 percent by echo done 08/25/16), hypertension, pulmonary hypertension, and dyslipidemia who presents to the ED with a chief complaint of a one-week history of worsening shortness of breath associated with leg swelling. He denies any need to increase his oxygen or use of his inhaler. He has however increased his Lasix. He normally takes 40 mg daily but has increased this to 80 mg daily with no appreciable effect, and over the past 24 hours he took a total of 120 mg of Lasix with no relief. He continues to smoke 1/2-1 pack per day. He has an associated cough that is mostly nonproductive but when he does expectorate, he coughs up thick creamy sputum that is blunted at times.  ED Course:  The patient had an EKG which showed AV paced rhythm at 68 bpm, a chest x-ray which showed mild left basilar atelectasis and a right pleural effusion. Troponin was 0.03. BNP was 233.5. On exam, the patient was visibly volume overloaded with JVD.  ROS:   Review of Systems  Constitutional: Positive for malaise/fatigue. Negative for chills, fever and weight loss.  Eyes:       Cataracts  Respiratory: Positive for cough, hemoptysis, sputum production and shortness of breath.   Cardiovascular: Positive for orthopnea and leg swelling. Negative for chest pain, claudication and PND.  Gastrointestinal: Positive for constipation.  Genitourinary: Negative for hematuria.       Suprapubic catheter in place.  Musculoskeletal:  Positive for joint pain and myalgias. Negative for falls.  Neurological: Positive for weakness.  Endo/Heme/Allergies: Bruises/bleeds easily.  Psychiatric/Behavioral: Positive for depression.    Past Medical History:   Past Medical History:  Diagnosis Date  . Chest pain syndrome   . CHF (congestive heart failure) (HCC)   . COPD (chronic obstructive pulmonary disease) (HCC)   . Dyslipidemia   . GERD (gastroesophageal reflux disease)   . OSA (obstructive sleep apnea)   . Pacemaker   . Permanent atrial fibrillation (HCC)   . Pulmonary hypertension (HCC)     Past Surgical History:   Past Surgical History:  Procedure Laterality Date  . HERNIA REPAIR    . NM MYOCAR PERF WALL MOTION  11/25/2008   normal  . PERMANENT PACEMAKER INSERTION  11/16/2009   [employer]  . PROSTATE SURGERY      Social History:   Social History   Socioeconomic History  . Marital status: Divorced    Spouse name: Not on file  . Number of children: Not on file  . Years of education: Not on file  . Highest education level: Not on file  Social Needs  . Financial resource strain: Not on file  . Food insecurity - worry: Not on file  . Food insecurity - inability: Not on file  . Transportation needs - medical: Not on file  . Transportation needs - non-medical: Not on file  Occupational History  . Not on file  Tobacco Use  . Smoking status: Current Every Day Smoker  Packs/day: 1.00    Years: 65.00    Pack years: 65.00    Types: Cigarettes  . Smokeless tobacco: Never Used  Substance and Sexual Activity  . Alcohol use: No    Alcohol/week: 0.0 oz  . Drug use: No  . Sexual activity: No  Other Topics Concern  . Not on file  Social History Narrative  . Not on file    Allergies   Patient has no known allergies.  Family history:   Family History  Problem Relation Age of Onset  . Colon cancer Mother   . CVA Mother   . Heart attack Mother   . Heart failure Father   . Lung disease Father     . Lung cancer Brother   . Heart attack Brother      Current Medications:   Prior to Admission medications   Medication Sig Start Date End Date Taking? Authorizing Provider  albuterol (PROVENTIL HFA;VENTOLIN HFA) 108 (90 BASE) MCG/ACT inhaler Inhale 2 puffs into the lungs every 6 (six) hours as needed for wheezing or shortness of breath.   Yes [provider]  ALPRAZolam Prudy Feeler) 1 MG tablet Take 0.5-1 mg by mouth daily as needed for anxiety.   Yes [provider]  cyanocobalamin (,VITAMIN B-12,) 1000 MCG/ML injection Inject 1,000 mcg into the muscle every 30 (thirty) days.   Yes [provider]  furosemide (LASIX) 40 MG tablet Take 40 mg by mouth daily. May take extra tab if needed   Yes [provider]  ketoconazole (NIZORAL) 2 % shampoo Apply 1 application topically every other day.  11/02/12  Yes [provider]  omeprazole (PRILOSEC) 20 MG capsule Take 20 mg by mouth daily.   Yes [provider]  oxyCODONE-acetaminophen (PERCOCET) 10-325 MG per tablet Take 1 tablet by mouth every 6 (six) hours as needed for pain.    Yes [provider]  rosuvastatin (CRESTOR) 20 MG tablet Take 20 mg by mouth daily.   Yes [provider]  sodium chloride (OCEAN) 0.65 % nasal spray Place 1 spray into the nose as needed for congestion.   Yes [provider]  Vitamin D, Ergocalciferol, (DRISDOL) 50000 UNITS CAPS Take 50,000 Units by mouth every 7 (seven) days. Wednesdays.   Yes [provider]  warfarin (COUMADIN) 5 MG tablet TAKE 1 TO 1 AND 1/2 TABLETS BY MOUTH DAILY AS DIRECTED BY COUMADIN CLINIC 12/05/16  Yes Croitoru, Mihai, MD  isosorbide mononitrate (IMDUR) 30 MG 24 hr tablet Take 1 tablet (30 mg total) by mouth daily. 08/24/16 11/22/16  Croitoru, Mihai, MD  nitroGLYCERIN (NITROSTAT) 0.4 MG SL tablet Place 1 tablet (0.4 mg total) under the tongue every 5 (five) minutes as needed for chest pain. 08/24/16   Croitoru, Rachelle Hora,  MD    Physical Exam:   Vitals:   02/08/17 1515 02/08/17 1530 02/08/17 1615 02/08/17 1715  BP: 129/64 (!) 115/57 (!) 122/59 128/68  Pulse: 70 (!) 50 67 61  Resp: (!) 22 18 (!) 21 (!) 21  Temp:      TempSrc:      SpO2: 91% 93% 93% 93%  Weight:      Height:         Physical Exam: Blood pressure 128/68, pulse 61, temperature 97.8 F (36.6 C), temperature source Oral, resp. rate (!) 21, height 5\' 8"  (1.727 m), weight 86.2 kg (190 lb), SpO2 93 %. Gen: Chronically ill-appearing male who has moderate dyspnea on exam. Head: Normocephalic, atraumatic. Eyes: Pupils equal, round  and reactive to light. Extraocular movements intact.  Sclerae nonicteric. No lid lag. Mouth: Oropharynx reveals dry mucous membranes and poor dentition. Neck: Supple, no thyromegaly, no lymphadenopathy, + jugular venous distention. Chest: Lungs are diminished with right basilar rales. CV: Heart sounds are regular with an S1, S2. 3/6 systolic ejection murmur. Abdomen: Soft, nontender, nondistended with normal active bowel sounds. No hepatosplenomegaly or palpable masses. Suprapubic catheter in place. Extremities: Extremities are significantly swollen, 3+ to the thighs. Skin: Warm and dry. No rashes, lesions or wounds. Neuro: Alert and oriented times 3; grossly nonfocal.  Psych: Insight is good and judgment is appropriate. Mood and affect depressed/flat.   Data Review:    Labs: Basic Metabolic Panel: Recent Labs  Lab 02/08/17 1152  NA 133*  K 3.8  CL 84*  CO2 40*  GLUCOSE 105*  BUN 24*  CREATININE 1.20  CALCIUM 8.5*   CBC: Recent Labs  Lab 02/08/17 1152  WBC 6.4  HGB 12.1*  HCT 40.8  MCV 91.3  PLT 137*    Urinalysis    Component Value Date/Time   COLORURINE ORANGE (A) 12/20/2012 1629   APPEARANCEUR CLOUDY (A) 12/20/2012 1629   LABSPEC 1.024 12/20/2012 1629   PHURINE 8.0 12/20/2012 1629   GLUCOSEU NEG 12/20/2012 1629   HGBUR NEG 12/20/2012 1629   BILIRUBINUR SMALL (A) 12/20/2012 1629     KETONESUR NEG 12/20/2012 1629   PROTEINUR > 300 (A) 12/20/2012 1629   UROBILINOGEN 1 12/20/2012 1629   NITRITE NEG 12/20/2012 1629   LEUKOCYTESUR MOD (A) 12/20/2012 1629      Radiographic Studies: Dg Chest 2 View  Result Date: 02/08/2017 CLINICAL DATA:  Shortness of breath, bilateral lower extremity swelling EXAM: CHEST  2 VIEW COMPARISON:  08/16/2016 FINDINGS: There is mild left basilar atelectasis. There is a small right pleural effusion. There is no pneumothorax. The heart and mediastinum are stable. There is a single lead cardiac pacemaker. The osseous structures are unremarkable. IMPRESSION: Small left pleural effusion. Electronically Signed   By: Elige KoHetal  Patel   On: 02/08/2017 12:17   Ct Abdomen Pelvis W Contrast  Result Date: 02/08/2017 CLINICAL DATA:  81 year old male with history of pain in the lower pelvic region. Severe back pain for several weeks. EXAM: CT ABDOMEN AND PELVIS WITH CONTRAST TECHNIQUE: Multidetector CT imaging of the abdomen and pelvis was performed using the standard protocol following bolus administration of intravenous contrast. CONTRAST:  100mL ISOVUE-300 IOPAMIDOL (ISOVUE-300) INJECTION 61% COMPARISON:  No priors. FINDINGS: Lower chest: Cardiomegaly. Severe thickening and calcification of the aortic valve. Calcification of the mitral annulus. Aortic atherosclerosis. Calcifications in the left anterior descending and left circumflex coronary arteries. Small right pleural effusion. Areas of scarring and/or atelectasis in the lung bases bilaterally (right greater than left). Hepatobiliary: Liver has a shrunken appearance and nodular contour, compatible with underlying cirrhosis. No definite cystic or solid hepatic lesions. No intra or extrahepatic biliary ductal dilatation. Multiple tiny calcified gallstones lying dependently in the gallbladder. Gallbladder appears only moderately distended. No gallbladder wall thickening. Pancreas: No pancreatic mass. No pancreatic  ductal dilatation. No pancreatic or peripancreatic fluid or inflammatory changes. Spleen: Unremarkable. Adrenals/Urinary Tract: Subcentimeter low-attenuation lesion in the lower pole the right kidney, too small to characterize, but statistically likely a cyst. Left kidney is normal in appearance. No hydroureteronephrosis. Urinary bladder is completely decompressed with an indwelling suprapubic Foley balloon catheter in place. Bilateral adrenal glands are normal in appearance. Stomach/Bowel: Normal appearance of the stomach. No pathologic dilatation of small bowel or colon. Numerous  colonic diverticulae are noted, without surrounding inflammatory changes to suggest an acute diverticulitis at this time. The appendix is not confidently identified and may be surgically absent. Regardless, there are no inflammatory changes noted adjacent to the cecum to suggest the presence of an acute appendicitis at this time. Vascular/Lymphatic: Aortic atherosclerosis, without evidence of aneurysm or dissection in the abdominal or pelvic vasculature per no lymphadenopathy noted in the abdomen or pelvis. Reproductive: Prostate gland and seminal vesicles are unremarkable in appearance. Other: Small volume of ascites.  No pneumoperitoneum. 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 There are no other aggressive appearing lytic or blastic lesions noted in the visualized portions of the skeleton. IMPRESSION: 1. No definite acute findings are noted in the abdomen or pelvis. 2. However, there are morphologic changes in the liver indicative of cirrhosis. This is associated with a small volume of ascites. 3. Extensive colonic diverticulosis without definite findings to suggest an acute diverticulitis at this time. 4. Cholelithiasis without evidence of acute cholecystitis at this time. 5. Small right pleural effusion with areas of  scarring and/or atelectasis in the lung bases bilaterally (right greater than left). 6. Cardiomegaly. 7. Aortic atherosclerosis, in addition to at least 2 vessel coronary artery disease. Assessment for potential risk factor modification, dietary therapy or pharmacologic therapy may be warranted, if clinically indicated. 8. There are calcifications of the aortic valve and mitral annulus. Echocardiographic correlation for evaluation of potential valvular dysfunction may be warranted if clinically indicated. Aortic Atherosclerosis (ICD10-I70.0). Electronically Signed   By: Trudie Reed M.D.   On: 02/08/2017 16:10    EKG: Independently reviewed. Ventricular paced rhythm at 68 bpm.   Assessment/Plan:   Principal Problem:   Acute on chronic combined systolic and diastolic CHF (congestive heart failure) (HCC) 2-D echo done back in July and showed an EF of 50-55 percent. Grossly volume overloaded on exam with prominent JVD and massive lower extremity swelling. Denies dietary indiscretions. Screening troponin negative. We'll cycle troponins 3 and repeat the echo if abnormal. Otherwise, will place on Lasix 80 mg every 6 hours and Zaroxolyn 2.5 mg twice a day given failure of outpatient escalation of oral Lasix dose. Concurrent potassium supplementation will be provided. Monitor renal function closely with diuresis. Add Cozaar. No beta blocker given decompensation.  Active Problems:   OBESITY, NOS BMI is approximately 29.    Major depressive disorder, recurrent episode (HCC) Monitor mood.    Tobacco use disorder Continues to actively smoke. Counseled.    CAD (coronary artery disease)/aortic atherosclerosis Placed on aspirin. Cycle troponins.    Chronic pulmonary heart disease (HCC)/COPD Gold 0 No active wheezing on exam. Suspect that respiratory issues are from CHF given massive volume overload. We'll place on Mucinex given reports of thick sputum.    OSA (obstructive sleep apnea) Has not  used CPAP in 2 years after his machine broke. May need outpatient referral to pulmonology to consider a repeat sleep study with resumption of CPAP.    Atrial fibrillation/Long term (current) use of anticoagulants/pacemaker Patient is V-paced. Continue Coumadin per pharmacy.  Other information:   DVT prophylaxis: Coumadin ordered. Code Status: Full code. Family Communication: Multiple family updated at bedside Disposition Plan: Likely home in 48-72 hours if able to achieve good diuresis. Consults called: None. Admission status: Inpatient. The patient has massive volume overload and will likely need aggressive diuresis spanning at least 2 midnights. He will need to  be monitored closely to achieve this level of diuresis, with daily evaluation of his kidney function.   Trula OreChristina Rama [facility] Pager [phone] Cell: [phone]   If 7PM-7AM, please contact night-coverage [email or web address] Password [email or web address] 02/08/2017, 5:56 PM

## 2017-02-08 NOTE — Progress Notes (Signed)
ANTICOAGULATION CONSULT NOTE - Initial Consult  Pharmacy Consult for warfarin (from PTA) Indication: atrial fibrillation  No Known Allergies  Patient Measurements: Height: 5\' 8"  (172.7 cm) Weight: 199 lb 8 oz (90.5 kg)(scale a) IBW/kg (Calculated) : 68.4  Vital Signs: Temp: 97.5 F (36.4 C) (12/26 1954) Temp Source: Oral (12/26 1954) BP: 121/70 (12/26 1954) Pulse Rate: 64 (12/26 1954)  Labs: Recent Labs    02/08/17 1152 02/08/17 2206  HGB 12.1*  --   HCT 40.8  --   PLT 137*  --   LABPROT  --  20.4*  INR  --  1.76  CREATININE 1.20  --     Estimated Creatinine Clearance: 54.5 mL/min (by C-G formula based on SCr of 1.2 mg/dL).   Medical History: Past Medical History:  Diagnosis Date  . Chest pain syndrome   . CHF (congestive heart failure) (HCC)   . COPD (chronic obstructive pulmonary disease) (HCC)   . Dyslipidemia   . GERD (gastroesophageal reflux disease)   . OSA (obstructive sleep apnea)   . Pacemaker   . Permanent atrial fibrillation (HCC)   . Pulmonary hypertension [facility](HCC)    Assessment: 81 yo male admitted with SOB and leg swelling. On warfarin PTA for afib, with last dose 12/25. Pharmacy consulted to dose.   Admission INR slightly subtherapeutic at 1.76. Hgb 12.1 and plts 137. No overt s/s bleeding noted.   PTA warfarin dose: 7.5mg  MWF and 5mg  all other days    Goal of Therapy:  INR 2-3 Monitor platelets by anticoagulation protocol: Yes   Plan:  Warfarin 7.5mg  PO x1 now Daily INR, CBC q72hr Monitor for s/s bleeding  Einar CrowKatherine Khadir Roam, PharmD Clinical Pharmacist 02/08/17 11:22 PM

## 2017-02-08 NOTE — ED Notes (Signed)
Md at bedside

## 2017-02-08 NOTE — ED Provider Notes (Addendum)
[facility] EMERGENCY DEPARTMENT Provider Note   CSN: [id number] Arrival date & time: 02/08/17  1106     History   Chief Complaint Chief Complaint  Patient presents with  . Groin Swelling  . Shortness of Breath    HPI Howard Torres. is a 81 y.o. male.  Complains of suprapubic swelling and pain as well as bilateral leg swelling and progressively worsening shortness of breath for the past 1 week.  Shortness of breath is worse with lying supine and improved with sitting upright.  He denies any chest pain he does admit to cough which is chronic and unchanged.  He denies swelling or pain in testicles.  He does report that his penis is somewhat swollen.  He treated himself with "3 fluid pills" earlier today.  No other associated symptoms.  No fever.  No vomiting.  HPI  Past Medical History:  Diagnosis Date  . Chest pain syndrome   . CHF (congestive heart failure) (HCC)   . COPD (chronic obstructive pulmonary disease) (HCC)   . Dyslipidemia   . GERD (gastroesophageal reflux disease)   . OSA (obstructive sleep apnea)   . Pacemaker   . Permanent atrial fibrillation (HCC)   . Pulmonary hypertension [facility])     Patient Active Problem List   Diagnosis Date Noted  . Angina pectoris (HCC) 11/25/2016  . PAH (pulmonary artery hypertension) (HCC) 08/26/2016  . Aortic atherosclerosis (HCC) 08/26/2016  . COPD GOLD 0 11/11/2014  . Pacemaker 12/23/2012  . Long term (current) use of anticoagulants 05/04/2012  . HYPERTENSION 06/25/2008  . OBSTRUCTIVE SLEEP APNEA 06/25/2008  . Atrial fibrillation with slow ventricular response (HCC) 06/12/2008  . Hypercholesterolemia 06/10/2008  . MORBID OBESITY 06/10/2008  . CAD (coronary artery disease) 06/10/2008  . Chronic pulmonary heart disease (HCC) 06/10/2008  . RENAL FAILURE, CHRONIC 06/10/2008  . BENIGN PROSTATIC HYPERTROPHY, HX OF 06/10/2008  . OBESITY, NOS 04/13/2006  . DEPRESSION, MAJOR, RECURRENT 04/13/2006  . ANXIETY  04/13/2006  . Tobacco use disorder 04/13/2006  . MIGRAINE, UNSPEC., W/O INTRACTABLE MIGRAINE 04/13/2006  . HYPERTENSION, BENIGN SYSTEMIC 04/13/2006  . Chronic airway obstruction, not elsewhere classified 04/13/2006  . PEPTIC ULCER DIS., UNSPEC. W/O OBSTRUCTION 04/13/2006  . BPH 04/13/2006  . SEBORRHEIC DERMATITIS, NOS 04/13/2006  . OSTEOARTHRITIS, MULTI SITES 04/13/2006    Past Surgical History:  Procedure Laterality Date  . HERNIA REPAIR    . NM MYOCAR PERF WALL MOTION  11/25/2008   normal  . PERMANENT PACEMAKER INSERTION  11/16/2009   medtronic  . PROSTATE SURGERY         Home Medications    Prior to Admission medications   Medication Sig Start Date End Date Taking? Authorizing Provider  albuterol (PROVENTIL HFA;VENTOLIN HFA) 108 (90 BASE) MCG/ACT inhaler Inhale 2 puffs into the lungs every 6 (six) hours as needed for wheezing or shortness of breath.    [provider]  ALPRAZolam Prudy Feeler) 1 MG tablet Take 0.5-1 mg by mouth daily as needed for anxiety.    [provider]  aspirin 81 MG chewable tablet Chew 81 mg by mouth daily.    [provider]  cyanocobalamin (,VITAMIN B-12,) 1000 MCG/ML injection Inject 1,000 mcg into the muscle every 30 (thirty) days.    [provider]  furosemide (LASIX) 40 MG tablet Take 40 mg by mouth daily. May take extra tab if needed    [provider]  isosorbide mononitrate (IMDUR) 30 MG 24 hr tablet Take 1 tablet (30 mg  total) by mouth daily. 08/24/16 11/22/16  Croitoru, Mihai, MD  ketoconazole (NIZORAL) 2 % shampoo Apply 1 application topically every other day.  11/02/12   [provider]  nitroGLYCERIN (NITROSTAT) 0.4 MG SL tablet Place 1 tablet (0.4 mg total) under the tongue every 5 (five) minutes as needed for chest pain. 08/24/16   Croitoru, Mihai, MD  omeprazole (PRILOSEC) 20 MG capsule Take 20 mg by mouth daily.    [provider]  oxyCODONE-acetaminophen (PERCOCET) 10-325 MG per  tablet Take 1 tablet by mouth every 6 (six) hours as needed for pain.     [provider]  potassium chloride (K-DUR,KLOR-CON) 10 MEQ tablet Take 10 mEq by mouth daily as needed.     [provider]  rosuvastatin (CRESTOR) 20 MG tablet Take 20 mg by mouth daily.    [provider]  sodium chloride (OCEAN) 0.65 % nasal spray Place 1 spray into the nose as needed for congestion.    [provider]  Vitamin D, Ergocalciferol, (DRISDOL) 50000 UNITS CAPS Take 50,000 Units by mouth every 7 (seven) days. Wednesdays.    [provider]  warfarin (COUMADIN) 5 MG tablet TAKE 1 TO 1 AND 1/2 TABLETS BY MOUTH DAILY AS DIRECTED BY COUMADIN CLINIC 12/05/16   Croitoru, Rachelle Hora, MD    Family History No family history on file.  Social History Social History   Tobacco Use  . Smoking status: Current Every Day Smoker    Packs/day: 1.00    Years: 65.00    Pack years: 65.00    Types: Cigarettes  . Smokeless tobacco: Never Used  Substance Use Topics  . Alcohol use: No    Alcohol/week: 0.0 oz  . Drug use: No     Allergies   Patient has no known allergies.   Review of Systems Review of Systems  Respiratory: Positive for cough and shortness of breath.        Chronic cough.  Cardiovascular: Positive for leg swelling.  Genitourinary: Negative.        Urinates through suprapubic catheter. Swelling of penis  All other systems reviewed and are negative.    Physical Exam Updated Vital Signs BP 102/60   Pulse 65   Temp 97.8 F (36.6 C) (Oral)   Resp (!) 22   Ht 5\' 8"  (1.727 m)   Wt 86.2 kg (190 lb)   SpO2 92%   BMI 28.89 kg/m   Physical Exam  Constitutional: He is oriented to person, place, and time. He appears well-developed and well-nourished.  Chronically ill-appearing  HENT:  Head: Normocephalic and atraumatic.  Eyes: Conjunctivae are normal. Pupils are equal, round, and reactive to light.  Neck: Neck supple. No tracheal deviation present. No  thyromegaly present.  Cardiovascular: Normal rate and regular rhythm.  No murmur heard. Pulmonary/Chest: Effort normal and breath sounds normal.  Rales right base  Abdominal: Soft. Bowel sounds are normal. He exhibits no distension. There is no tenderness.  Obese suprapubic catheter in place  Genitourinary:  Genitourinary Comments: peni is slightly swollen from normal.  Surgical scar overlying suprapubic area with tenderness at scar site.  No obvious hernia mass  Musculoskeletal: Normal range of motion. He exhibits edema. He exhibits no tenderness.  3+ pretibial pitting edema bilaterally  Neurological: He is alert and oriented to person, place, and time. Coordination normal.  Skin: Skin is warm and dry. No rash noted.  Psychiatric: He has a normal mood and affect.  Nursing note and vitals reviewed.  ED Treatments / Results  Labs (all labs ordered are listed, but only abnormal results are displayed) Labs Reviewed  BASIC METABOLIC PANEL - Abnormal; Notable for the following components:      Result Value   Sodium 133 (*)    Chloride 84 (*)    CO2 40 (*)    Glucose, Bld 105 (*)    BUN 24 (*)    Calcium 8.5 (*)    GFR calc non Af Amer 56 (*)    All other components within normal limits  CBC - Abnormal; Notable for the following components:   Hemoglobin 12.1 (*)    MCHC 29.7 (*)    Platelets 137 (*)    All other components within normal limits  BRAIN NATRIURETIC PEPTIDE - Abnormal; Notable for the following components:   B Natriuretic Peptide 233.5 (*)    All other components within normal limits  I-STAT TROPONIN, ED   Results for orders placed or performed during the hospital encounter of 02/08/17  Basic metabolic panel  Result Value Ref Range   Sodium 133 (L) 135 - 145 mmol/L   Potassium 3.8 3.5 - 5.1 mmol/L   Chloride 84 (L) 101 - 111 mmol/L   CO2 40 (H) 22 - 32 mmol/L   Glucose, Bld 105 (H) 65 - 99 mg/dL   BUN 24 (H) 6 - 20 mg/dL   Creatinine, Ser 4.69 0.61 - 1.24  mg/dL   Calcium 8.5 (L) 8.9 - 10.3 mg/dL   GFR calc non Af Amer 56 (L) >60 mL/min   GFR calc Af Amer >60 >60 mL/min   Anion gap 9 5 - 15  CBC  Result Value Ref Range   WBC 6.4 4.0 - 10.5 K/uL   RBC 4.47 4.22 - 5.81 MIL/uL   Hemoglobin 12.1 (L) 13.0 - 17.0 g/dL   HCT 62.9 52.8 - 41.3 %   MCV 91.3 78.0 - 100.0 fL   MCH 27.1 26.0 - 34.0 pg   MCHC 29.7 (L) 30.0 - 36.0 g/dL   RDW 24.4 01.0 - 27.2 %   Platelets 137 (L) 150 - 400 K/uL  Brain natriuretic peptide  Result Value Ref Range   B Natriuretic Peptide 233.5 (H) 0.0 - 100.0 pg/mL  I-stat troponin, ED  Result Value Ref Range   Troponin i, poc 0.03 0.00 - 0.08 ng/mL   Comment 3           Dg Chest 2 View  Result Date: 02/08/2017 CLINICAL DATA:  Shortness of breath, bilateral lower extremity swelling EXAM: CHEST  2 VIEW COMPARISON:  08/16/2016 FINDINGS: There is mild left basilar atelectasis. There is a small right pleural effusion. There is no pneumothorax. The heart and mediastinum are stable. There is a single lead cardiac pacemaker. The osseous structures are unremarkable. IMPRESSION: Small left pleural effusion. Electronically Signed   By: Elige Ko   On: 02/08/2017 12:17   Ct Abdomen Pelvis W Contrast  Result Date: 02/08/2017 CLINICAL DATA:  81 year old male with history of pain in the lower pelvic region. Severe back pain for several weeks. EXAM: CT ABDOMEN AND PELVIS WITH CONTRAST TECHNIQUE: Multidetector CT imaging of the abdomen and pelvis was performed using the standard protocol following bolus administration of intravenous contrast. CONTRAST:  ISOVUE-300 IOPAMIDOL (ISOVUE-300) INJECTION 61% COMPARISON:  No priors. FINDINGS: Lower chest: Cardiomegaly. Severe thickening and calcification of the aortic valve. Calcification of the mitral annulus. Aortic atherosclerosis. Calcifications in the left anterior descending and left circumflex coronary arteries. Small right  pleural effusion. Areas of scarring and/or atelectasis  in the lung bases bilaterally (right greater than left). Hepatobiliary: Liver has a shrunken appearance and nodular contour, compatible with underlying cirrhosis. No definite cystic or solid hepatic lesions. No intra or extrahepatic biliary ductal dilatation. Multiple tiny calcified gallstones lying dependently in the gallbladder. Gallbladder appears only moderately distended. No gallbladder wall thickening. Pancreas: No pancreatic mass. No pancreatic ductal dilatation. No pancreatic or peripancreatic fluid or inflammatory changes. Spleen: Unremarkable. Adrenals/Urinary Tract: Subcentimeter low-attenuation lesion in the lower pole the right kidney, too small to characterize, but statistically likely a cyst. Left kidney is normal in appearance. No hydroureteronephrosis. Urinary bladder is completely decompressed with an indwelling suprapubic Foley balloon catheter in place. Bilateral adrenal glands are normal in appearance. Stomach/Bowel: Normal appearance of the stomach. No pathologic dilatation of small bowel or colon. Numerous colonic diverticulae are noted, without surrounding inflammatory changes to suggest an acute diverticulitis at this time. The appendix is not confidently identified and may be surgically absent. Regardless, there are no inflammatory changes noted adjacent to the cecum to suggest the presence of an acute appendicitis at this time. Vascular/Lymphatic: Aortic atherosclerosis, without evidence of aneurysm or dissection in the abdominal or pelvic vasculature per no lymphadenopathy noted in the abdomen or pelvis. Reproductive: Prostate gland and seminal vesicles are unremarkable in appearance. Other: Small volume of ascites.  No pneumoperitoneum. 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 There are no other aggressive appearing lytic or blastic lesions noted in the visualized portions  of the skeleton. IMPRESSION: 1. No definite acute findings are noted in the abdomen or pelvis. 2. However, there are morphologic changes in the liver indicative of cirrhosis. This is associated with a small volume of ascites. 3. Extensive colonic diverticulosis without definite findings to suggest an acute diverticulitis at this time. 4. Cholelithiasis without evidence of acute cholecystitis at this time. 5. Small right pleural effusion with areas of scarring and/or atelectasis in the lung bases bilaterally (right greater than left). 6. Cardiomegaly. 7. Aortic atherosclerosis, in addition to at least 2 vessel coronary artery disease. Assessment for potential risk factor modification, dietary therapy or pharmacologic therapy may be warranted, if clinically indicated. 8. There are calcifications of the aortic valve and mitral annulus. Echocardiographic correlation for evaluation of potential valvular dysfunction may be warranted if clinically indicated. Aortic Atherosclerosis (ICD10-I70.0). Electronically Signed   By: Trudie Reedaniel  Entrikin M.D.   On: 02/08/2017 16:10    EKG  EKG Interpretation  Date/Time:  Wednesday February 08 2017 11:38:29 EST Ventricular Rate:  68 PR Interval:    QRS Duration: 176 QT Interval:  478 QTC Calculation: 508 R Axis:   -88 Text Interpretation:  Electronic ventricular pacemaker Confirmed by Doug SouJacubowitz, Samira Acero [phone]([id number]) on 02/08/2017 5:47:44 PM     Previous tracing a flutter  Radiology Dg Chest 2 View  Result Date: 02/08/2017 CLINICAL DATA:  Shortness of breath, bilateral lower extremity swelling EXAM: CHEST  2 VIEW COMPARISON:  08/16/2016 FINDINGS: There is mild left basilar atelectasis. There is a small right pleural effusion. There is no pneumothorax. The heart and mediastinum are stable. There is a single lead cardiac pacemaker. The osseous structures are unremarkable. IMPRESSION: Small left pleural effusion. Electronically Signed   By: Elige KoHetal  Patel   On: 02/08/2017 12:17    Chest x-ray reviewed by me  Procedures Procedures (including critical care time)  Medications Ordered in ED Medications - No  data to display   Initial Impression / Assessment and Plan / ED Course  I have reviewed the triage vital signs and the nursing notes.  Pertinent labs & imaging results that were available during my care of the patient were reviewed by me and considered in my medical decision making (see chart for details).   5:20 PM patient suprapubic pain is improved after treatment with intravenous fentanyl.  Patient's dyspnea is felt to be multifactorial including COPD and CHF and pleural effusion.  [facility] consulted and arrange for admission Final Clinical Impressions(s) / ED Diagnoses  Diagnoses #1 dyspnea #2 peripheral edema Final diagnoses:  None  #3 left pleural effusion #4 suprapubic pain ED Discharge Orders    None       Doug SouJacubowitz, Hady Niemczyk, MD 02/08/17 1749    Doug SouJacubowitz, Alison Kubicki, MD 02/08/17 1750

## 2017-02-09 DIAGNOSIS — K7469 Other cirrhosis of liver: Secondary | ICD-10-CM

## 2017-02-09 LAB — MAGNESIUM: Magnesium: 1.8 mg/dL (ref 1.7–2.4)

## 2017-02-09 LAB — BASIC METABOLIC PANEL
Anion gap: 10 (ref 5–15)
BUN: 19 mg/dL (ref 6–20)
CHLORIDE: 81 mmol/L — AB (ref 101–111)
CO2: 42 mmol/L — AB (ref 22–32)
CREATININE: 1.04 mg/dL (ref 0.61–1.24)
Calcium: 8.5 mg/dL — ABNORMAL LOW (ref 8.9–10.3)
GFR calc non Af Amer: >60 mL/min (ref >60)
GLUCOSE: 95 mg/dL (ref 65–99)
Potassium: 3.5 mmol/L (ref 3.5–5.1)
Sodium: 133 mmol/L — ABNORMAL LOW (ref 135–145)

## 2017-02-09 LAB — CBC
HCT: 40.8 % (ref 39.0–52.0)
Hemoglobin: 12.1 g/dL — ABNORMAL LOW (ref 13.0–17.0)
MCH: 27.1 pg (ref 26.0–34.0)
MCHC: 29.7 g/dL — ABNORMAL LOW (ref 30.0–36.0)
MCV: 91.5 fL (ref 78.0–100.0)
Platelets: 135 10*3/uL — ABNORMAL LOW (ref 150–400)
RBC: 4.46 MIL/uL (ref 4.22–5.81)
RDW: 15.5 % (ref 11.5–15.5)
WBC: 5.5 10*3/uL (ref 4.0–10.5)

## 2017-02-09 LAB — PROTIME-INR
INR: 1.77
PROTHROMBIN TIME: 20.5 s — AB (ref 11.4–15.2)

## 2017-02-09 LAB — TROPONIN I
TROPONIN I: 0.04 ng/mL — AB (ref <0.03)
TROPONIN I: 0.05 ng/mL — AB (ref <0.03)

## 2017-02-09 MED ORDER — METOLAZONE 2.5 MG PO TABS
2.5000 mg | ORAL_TABLET | Freq: Every day | ORAL | Status: DC
  Administered 2017-02-10: 2.5 mg via ORAL
  Filled 2017-02-09: qty 1

## 2017-02-09 MED ORDER — WARFARIN SODIUM 7.5 MG PO TABS
7.5000 mg | ORAL_TABLET | Freq: Once | ORAL | Status: AC
  Administered 2017-02-09: 7.5 mg via ORAL
  Filled 2017-02-09: qty 1

## 2017-02-09 MED ORDER — ACETAMINOPHEN 325 MG PO TABS: 650.0000 mg | ORAL_TABLET | Freq: Four times a day (QID) | ORAL | Status: DC | PRN

## 2017-02-09 MED ORDER — FUROSEMIDE 10 MG/ML IJ SOLN
60.0000 mg | Freq: Two times a day (BID) | INTRAMUSCULAR | Status: DC
  Administered 2017-02-10: 60 mg via INTRAVENOUS
  Filled 2017-02-09: qty 6

## 2017-02-09 MED ORDER — ALUM & MAG HYDROXIDE-SIMETH 200-200-20 MG/5ML PO SUSP
15.0000 mL | Freq: Once | ORAL | Status: AC
  Administered 2017-02-09: 15 mL via ORAL
  Filled 2017-02-09: qty 30

## 2017-02-09 NOTE — Plan of Care (Signed)
  Progressing Education: Knowledge of General Education information will improve 02/09/2017 1125 - Progressing by Quentin CornwallMadison, Chalmers Iddings, RN Health Behavior/Discharge Planning: Ability to manage health-related needs will improve 02/09/2017 1125 - Progressing by Quentin CornwallMadison, Jamey Harman, RN Clinical Measurements: Ability to maintain clinical measurements within normal limits will improve 02/09/2017 1125 - Progressing by Quentin CornwallMadison, Shaneese Tait, RN Will remain free from infection 02/09/2017 1125 - Progressing by Quentin CornwallMadison, Kristiann Noyce, RN Diagnostic test results will improve 02/09/2017 1125 - Progressing by Quentin CornwallMadison, Telisa Ohlsen, RN Respiratory complications will improve 02/09/2017 1125 - Progressing by Quentin CornwallMadison, Izzy Doubek, RN Cardiovascular complication will be avoided 02/09/2017 1125 - Progressing by Quentin CornwallMadison, Solimar Maiden, RN Activity: Risk for activity intolerance will decrease 02/09/2017 1125 - Progressing by Quentin CornwallMadison, Clarity Ciszek, RN Nutrition: Adequate nutrition will be maintained 02/09/2017 1125 - Progressing by Quentin CornwallMadison, Lanisa Ishler, RN Coping: Level of anxiety will decrease 02/09/2017 1125 - Progressing by Quentin CornwallMadison, Vitoria Conyer, RN Elimination: Will not experience complications related to bowel motility 02/09/2017 1125 - Progressing by Quentin CornwallMadison, Aracelly Tencza, RN Will not experience complications related to urinary retention 02/09/2017 1125 - Progressing by Quentin CornwallMadison, Darrius Montano, RN Pain Managment: General experience of comfort will improve 02/09/2017 1125 - Progressing by Quentin CornwallMadison, Kristianne Albin, RN Safety: Ability to remain free from injury will improve 02/09/2017 1125 - Progressing by Quentin CornwallMadison, Celia Gibbons, RN Skin Integrity: Risk for impaired skin integrity will decrease 02/09/2017 1125 - Progressing by Quentin CornwallMadison, Mieczyslaw Stamas, RN Education: Ability to demonstrate management of disease process will improve 02/09/2017 1125 - Progressing by Quentin CornwallMadison, Challis Crill, RN Ability to verbalize understanding of medication therapies will improve 02/09/2017 1125 - Progressing  by Quentin CornwallMadison, Kikuye Korenek, RN Activity: Capacity to carry out activities will improve 02/09/2017 1125 - Progressing by Quentin CornwallMadison, Letisha Yera, RN Cardiac: Ability to achieve and maintain adequate cardiopulmonary perfusion will improve 02/09/2017 1125 - Progressing by Quentin CornwallMadison, Jaryiah Mehlman, RN

## 2017-02-09 NOTE — Plan of Care (Signed)
Pt. Bed alarm on for safety. Pt. Told to call for assistance with ambulation. Pt. Verbalized understanding.

## 2017-02-09 NOTE — Plan of Care (Signed)
  Education: Knowledge of General Education information will improve 02/09/2017 0141 - Progressing by Jeanella Flatteryhomas, Kiaria Quinnell T, RN

## 2017-02-09 NOTE — Progress Notes (Signed)
PT Cancellation Note  Patient Details Name: Howard CombesWalter C Averitt Jr. MRN: [id number] DOB: 02/28/1935   Cancelled Treatment:    Reason Eval/Treat Not Completed: Patient not medically ready;Patient declined, no reason specified.  My back hurts so bad I can't do anything. 02/09/2017  Slick BingKen Howard Torres, PT [phone]  (pager)   Howard Torres 02/09/2017, 5:45 PM

## 2017-02-09 NOTE — Progress Notes (Addendum)
PROGRESS NOTE   Howard CombesWalter C Kaseman Torres.  ZOX:[id number]    DOB: 02/10/1936    DOA: 02/08/2017  PCP: Aida PufferLittle, James, MD   I have briefly reviewed patients previous medical records in [facility].  Brief Narrative:  81 year old male with PMH of OSA (has not used CPAP in 2 years), chronic respiratory failure with hypoxia on 2 L/m home oxygen, chronic atrial fibrillation on Coumadin, status post pacemaker, COPD, chronic diastolic CHF (EF 81-1950-55 percent by TTE 08/25/16), HTN, pulmonary hypertension, tobacco abuse HLD, chronic suprapubic catheter (last changed 2 weeks ago, follows with Dr. Cassell Smilesanenbaum, urology) presented with one-week history of progressive dyspnea and leg swelling despite increasing home dose of Lasix. Admitted for decompensated CHF. Improving.   Assessment & Plan:   Principal Problem:   Acute on chronic combined systolic and diastolic CHF (congestive heart failure) (HCC) Active Problems:   OBESITY, NOS   Major depressive disorder, recurrent episode (HCC)   Tobacco use disorder   CAD (coronary artery disease)   Chronic pulmonary heart disease (HCC)   OSA (obstructive sleep apnea)   Long term (current) use of anticoagulants   Pacemaker   COPD GOLD 0   Aortic atherosclerosis (HCC)   1. Acute on chronic combined systolic and diastolic CHF: TTE 08/25/16: LVEF 50-55 percent. Grossly volume overloaded on admission. He was started on IV Lasix 60 mg every 6 hourly and metolazone 2.5 MG twice a day. -5 L since admission. Weight has not changed much,? Accuracy. Flat trend troponin likely demand ischemia. Reduce IV Lasix to 60 mg every 12 hours and metolazone to 2.5 MG daily. Losartan was added on admission. No beta blockers due to acute CHF.  2. Elevated troponin: Mild and flat trend without chest pain. Likely demand ischemia. However will obtain repeat 2-D echo and if reduced EF or wall motion abnormalities, consider cardiology consultation. CT abdomen shows aortic atherosclerosis, at  least 2 vessel CAD and calcification of aortic and mitral annulus. 3. Cirrhosis: Noted on CT abdomen. Some of his volume overload may be due to cirrhosis, hypoalbuminemia in addition to CHF.  4. Hypokalemia: Replace and follow. 5. OSA: Non-compliant with CPAP. 6. COPD/tobacco abuse: No clinical bronchospasm. Cessation counseled. He declines nicotine patch. 7. Chronic atrial fibrillation/pacemaker: V paced on monitor. INR subtherapeutic. Coumadin per pharmacy. 8. Essential hypertension: Controlled. Losartan added. On Imdur. 9. Chronic suprapubic catheter: Changed 2 weeks PTA. 10. Thrombocytopenia: May be chronic and intermittent. Platelets 113 in April 2016. Stable in the 130s since admission. Follow closely. 11. Overweight/Body mass index is 29.1 kg/m. 12. Depression: Stable.    DVT prophylaxis: SCDs. Coumadin per pharmacy. Code Status: Full Family Communication: Patient's son was sleeping at bedside, did not arouse him. Disposition: DC home when medically improved.   Consultants:  None   Procedures:  None  Antimicrobials:  None    Subjective: Feels much better. Dyspnea improved but breathing not yet at baseline. Leg swelling have significantly improved. No chest pain. Chronic dry cough.  ROS: No dizziness, lightheadedness, palpitations.  Objective:  Vitals:   02/08/17 1954 02/09/17 0021 02/09/17 0602 02/09/17 1226  BP: 121/70 (!) 114/53 (!) 120/45 (!) 105/52  Pulse: 64 61 61 (!) 59  Resp: 20 20 20 20   Temp: (!) 97.5 F (36.4 C) 98.7 F (37.1 C) 98.8 F (37.1 C) 98.8 F (37.1 C)  TempSrc: Oral Oral Oral Oral  SpO2: 93% 95% 96% 91%  Weight: 90.5 kg (199 lb 8 oz)  86.8 kg (191 lb 6.4 oz)  Height: 5\' 8"  (1.727 m)       Examination:  General exam: Pleasant elderly male lying comfortably supine in bed. Respiratory system: Few bibasal crackles but otherwise clear to auscultation. Respiratory effort normal. Cardiovascular system: S1 & S2 heard, RRR. No JVD,  murmurs, rubs, gallops or clicks. 2+ pitting bilateral leg edema extending to the thighs and sacrum. Telemetry personally reviewed: V paced rhythm. Gastrointestinal system: Abdomen is nondistended, soft and nontender. No organomegaly or masses felt. Normal bowel sounds heard. Has chronic suprapubic catheter, site intact without acute findings. Central nervous system: Alert and oriented. No focal neurological deficits. Extremities: Symmetric 5 x 5 power. Skin: No rashes, lesions or ulcers Psychiatry: Judgement and insight appear normal. Mood & affect appropriate.     Data Reviewed: I have personally reviewed following labs and imaging studies  CBC: Recent Labs  Lab 02/08/17 1152 02/09/17 0534  WBC 6.4 5.5  HGB 12.1* 12.1*  HCT 40.8 40.8  MCV 91.3 91.5  PLT 137* 135*   Basic Metabolic Panel: Recent Labs  Lab 02/08/17 1152 02/09/17 0208 02/09/17 0904  NA 133* 133*  --   K 3.8 3.5  --   CL 84* 81*  --   CO2 40* 42*  --   GLUCOSE 105* 95  --   BUN 24* 19  --   CREATININE 1.20 1.04  --   CALCIUM 8.5* 8.5*  --   MG  --   --  1.8   Liver Function Tests: Recent Labs  Lab 02/08/17 1828  AST 27  ALT 16*  ALKPHOS 60  BILITOT 1.6*  PROT 6.2*  ALBUMIN 3.3*   Coagulation Profile: Recent Labs  Lab 02/08/17 2206 02/09/17 0904  INR 1.76 1.77   Cardiac Enzymes: Recent Labs  Lab 02/08/17 2136 02/09/17 0208 02/09/17 0904  TROPONINI 0.04* 0.04* 0.05*       Radiology Studies: Dg Chest 2 View  Result Date: 02/08/2017 CLINICAL DATA:  Shortness of breath, bilateral lower extremity swelling EXAM: CHEST  2 VIEW COMPARISON:  08/16/2016 FINDINGS: There is mild left basilar atelectasis. There is a small right pleural effusion. There is no pneumothorax. The heart and mediastinum are stable. There is a single lead cardiac pacemaker. The osseous structures are unremarkable. IMPRESSION: Small left pleural effusion. Electronically Signed   By: Elige KoHetal  Patel   On: 02/08/2017 12:17     Ct Abdomen Pelvis W Contrast  Result Date: 02/08/2017 CLINICAL DATA:  81 year old male with history of pain in the lower pelvic region. Severe back pain for several weeks. EXAM: CT ABDOMEN AND PELVIS WITH CONTRAST TECHNIQUE: Multidetector CT imaging of the abdomen and pelvis was performed using the standard protocol following bolus administration of intravenous contrast. CONTRAST:  100mL ISOVUE-300 IOPAMIDOL (ISOVUE-300) INJECTION 61% COMPARISON:  No priors. FINDINGS: Lower chest: Cardiomegaly. Severe thickening and calcification of the aortic valve. Calcification of the mitral annulus. Aortic atherosclerosis. Calcifications in the left anterior descending and left circumflex coronary arteries. Small right pleural effusion. Areas of scarring and/or atelectasis in the lung bases bilaterally (right greater than left). Hepatobiliary: Liver has a shrunken appearance and nodular contour, compatible with underlying cirrhosis. No definite cystic or solid hepatic lesions. No intra or extrahepatic biliary ductal dilatation. Multiple tiny calcified gallstones lying dependently in the gallbladder. Gallbladder appears only moderately distended. No gallbladder wall thickening. Pancreas: No pancreatic mass. No pancreatic ductal dilatation. No pancreatic or peripancreatic fluid or inflammatory changes. Spleen: Unremarkable. Adrenals/Urinary Tract: Subcentimeter low-attenuation lesion in the lower pole the right kidney, too  small to characterize, but statistically likely a cyst. Left kidney is normal in appearance. No hydroureteronephrosis. Urinary bladder is completely decompressed with an indwelling suprapubic Foley balloon catheter in place. Bilateral adrenal glands are normal in appearance. Stomach/Bowel: Normal appearance of the stomach. No pathologic dilatation of small bowel or colon. Numerous colonic diverticulae are noted, without surrounding inflammatory changes to suggest an acute diverticulitis at this time. The  appendix is not confidently identified and may be surgically absent. Regardless, there are no inflammatory changes noted adjacent to the cecum to suggest the presence of an acute appendicitis at this time. Vascular/Lymphatic: Aortic atherosclerosis, without evidence of aneurysm or dissection in the abdominal or pelvic vasculature per no lymphadenopathy noted in the abdomen or pelvis. Reproductive: Prostate gland and seminal vesicles are unremarkable in appearance. Other: Small volume of ascites.  No pneumoperitoneum. 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 There are no other aggressive appearing lytic or blastic lesions noted in the visualized portions of the skeleton. IMPRESSION: 1. No definite acute findings are noted in the abdomen or pelvis. 2. However, there are morphologic changes in the liver indicative of cirrhosis. This is associated with a small volume of ascites. 3. Extensive colonic diverticulosis without definite findings to suggest an acute diverticulitis at this time. 4. Cholelithiasis without evidence of acute cholecystitis at this time. 5. Small right pleural effusion with areas of scarring and/or atelectasis in the lung bases bilaterally (right greater than left). 6. Cardiomegaly. 7. Aortic atherosclerosis, in addition to at least 2 vessel coronary artery disease. Assessment for potential risk factor modification, dietary therapy or pharmacologic therapy may be warranted, if clinically indicated. 8. There are calcifications of the aortic valve and mitral annulus. Echocardiographic correlation for evaluation of potential valvular dysfunction may be warranted if clinically indicated. Aortic Atherosclerosis (ICD10-I70.0). Electronically Signed   By: Trudie Reed M.D.   On: 02/08/2017 16:10        Scheduled Meds: . aspirin EC  81 mg Oral Daily  . furosemide  80 mg Intravenous  Q6H  . guaiFENesin  600 mg Oral BID  . isosorbide mononitrate  30 mg Oral Daily  . losartan  25 mg Oral Daily  . metolazone  2.5 mg Oral BID  . pantoprazole  40 mg Oral Daily  . potassium chloride  20 mEq Oral TID  . rosuvastatin  20 mg Oral Daily  . sodium chloride flush  3 mL Intravenous Q12H  . warfarin  7.5 mg Oral ONCE-1800  . Warfarin - Pharmacist Dosing Inpatient   Does not apply q1800   Continuous Infusions: . sodium chloride       LOS: 1 day     Marcellus Scott, MD, FACP, [facility]. [facility] Pager [phone]  If 7PM-7AM, please contact night-coverage [email or web address] Password [email or web address] 02/09/2017, 2:03 PM

## 2017-02-09 NOTE — Progress Notes (Signed)
Troponin 0.04, MD aware NP notified.

## 2017-02-09 NOTE — Progress Notes (Addendum)
ANTICOAGULATION CONSULT NOTE  Pharmacy Consult for warfarin (from PTA) Indication: atrial fibrillation  No Known Allergies  Patient Measurements: Height: 5\' 8"  (172.7 cm) Weight: 191 lb 6.4 oz (86.8 kg)(scale a) IBW/kg (Calculated) : 68.4  Vital Signs: Temp: 98.8 F (37.1 C) (12/27 0602) Temp Source: Oral (12/27 0602) BP: 120/45 (12/27 0602) Pulse Rate: 61 (12/27 0602)  Labs: Recent Labs    02/08/17 1152 02/08/17 2136 02/08/17 2206 02/09/17 0208 02/09/17 0534 02/09/17 0904  HGB 12.1*  --   --   --  12.1*  --   HCT 40.8  --   --   --  40.8  --   PLT 137*  --   --   --  135*  --   LABPROT  --   --  20.4*  --   --  20.5*  INR  --   --  1.76  --   --  1.77  CREATININE 1.20  --   --  1.04  --   --   TROPONINI  --  0.04*  --  0.04*  --  0.05*    Estimated Creatinine Clearance: 61.7 mL/min (by C-G formula based on SCr of 1.04 mg/dL).   Medical History: Past Medical History:  Diagnosis Date  . Chest pain syndrome   . CHF (congestive heart failure) (HCC)   . COPD (chronic obstructive pulmonary disease) (HCC)   . Dyslipidemia   . GERD (gastroesophageal reflux disease)   . OSA (obstructive sleep apnea)   . Pacemaker   . Permanent atrial fibrillation (HCC)   . Pulmonary hypertension [facility](HCC)    Assessment: 81 yo male admitted with SOB/leg swelling and noted w/ HF. On warfarin PTA for afib, pharmacy consulted to dose.  -INR= 1.77   PTA warfarin dose: 7.5mg  MWF and 5mg  all other days  (Admit INR 1.76)   Goal of Therapy:  INR 2-3 Monitor platelets by anticoagulation protocol: Yes   Plan:  Warfarin 7.5mg  po x1 Daily INR  Harland GermanAndrew Marielis Samara, Pharm D 02/09/2017 11:26 AM

## 2017-02-10 ENCOUNTER — Inpatient Hospital Stay (HOSPITAL_COMMUNITY): Payer: Medicare Other

## 2017-02-10 DIAGNOSIS — I5043 Acute on chronic combined systolic (congestive) and diastolic (congestive) heart failure: Secondary | ICD-10-CM

## 2017-02-10 DIAGNOSIS — I509 Heart failure, unspecified: Secondary | ICD-10-CM

## 2017-02-10 LAB — PROTIME-INR
INR: 2
PROTHROMBIN TIME: 22.5 s — AB (ref 11.4–15.2)

## 2017-02-10 LAB — BASIC METABOLIC PANEL
Anion gap: 9 (ref 5–15)
BUN: 23 mg/dL — AB (ref 6–20)
CO2: 45 mmol/L — ABNORMAL HIGH (ref 22–32)
CREATININE: 1.18 mg/dL (ref 0.61–1.24)
Calcium: 8.8 mg/dL — ABNORMAL LOW (ref 8.9–10.3)
Chloride: 81 mmol/L — ABNORMAL LOW (ref 101–111)
GFR calc Af Amer: >60 mL/min (ref >60)
GFR, EST NON AFRICAN AMERICAN: 57 mL/min — AB (ref >60)
Glucose, Bld: 97 mg/dL (ref 65–99)
Potassium: 3.7 mmol/L (ref 3.5–5.1)
SODIUM: 135 mmol/L (ref 135–145)

## 2017-02-10 LAB — ECHOCARDIOGRAM COMPLETE
HEIGHTINCHES: 68 in
WEIGHTICAEL: 2910.4 [oz_av]

## 2017-02-10 MED ORDER — FUROSEMIDE 10 MG/ML IJ SOLN
60.0000 mg | Freq: Two times a day (BID) | INTRAMUSCULAR | Status: DC
  Administered 2017-02-10: 60 mg via INTRAVENOUS
  Filled 2017-02-10: qty 6

## 2017-02-10 MED ORDER — WARFARIN SODIUM 7.5 MG PO TABS
7.5000 mg | ORAL_TABLET | Freq: Once | ORAL | Status: AC
  Administered 2017-02-10: 7.5 mg via ORAL
  Filled 2017-02-10: qty 1

## 2017-02-10 MED ORDER — BISACODYL 5 MG PO TBEC
10.0000 mg | DELAYED_RELEASE_TABLET | Freq: Once | ORAL | Status: AC
  Administered 2017-02-10: 10 mg via ORAL
  Filled 2017-02-10: qty 2

## 2017-02-10 MED ORDER — LOSARTAN POTASSIUM 25 MG PO TABS: 12.5000 mg | ORAL_TABLET | Freq: Every day | ORAL | Status: DC

## 2017-02-10 MED ORDER — FUROSEMIDE 10 MG/ML IJ SOLN: 60.0000 mg | Freq: Every day | INTRAMUSCULAR | Status: DC

## 2017-02-10 MED ORDER — MAGNESIUM HYDROXIDE 400 MG/5ML PO SUSP
30.0000 mL | Freq: Once | ORAL | Status: AC
  Administered 2017-02-10: 30 mL via ORAL
  Filled 2017-02-10: qty 30

## 2017-02-10 NOTE — Care Management Note (Addendum)
Case Management Note  Patient Details  Name: Howard CombesWalter C Carollo Jr. MRN: [id number] Date of Birth: 08/14/1935  Subjective/Objective:   Admitted with Congestive heart failure.               Action/Plan: In to speak with Patient, son at bedside.  Prior to admission Patient lived at home with son and daughter.  PCP is James Little in [place], [place]. Urologist is Dr. Patsi Searsannenbaum.  [place] [facility] [place].  Uses home 02 delivered by [employer].  Home DME includes: scale, cane, oxygen.  Children drive patient for: shopping and medical appointments. Recommended patient contact DSS (medicaid benefit) regarding transportation to medical appointments. Patient denies inability to afford medications or food. Patient continues to smoke and declined information on smoking cessation.  Reminded Patient the safety importance of not smoking around Oxygen.  Patient verbalized understanding.  Patient admits to using salt excessively.  Offered dietician consult.  D[facility]. HH PT Referral called Corrie DandyMary [facility] will continue to follow for discharge needs. DME walker (rollator) and shower chair [facility].    Expected Discharge Date:  02/11/17               Expected Discharge Plan:  Home w Home Health Services  Discharge planning Services  CM Consult  Choice offered to:   Patient/Son  DME Arranged:    walker [facility]ME [facility]   [facility]  HH Arranged:    PT HH Agency:   [facility]  Status of Service:  In process, will continue to follow  Yancey FlemingsKimberly R Becton, RN, BSN Case Manager-orientation [phone] 02/10/2017, 3:08 PM

## 2017-02-10 NOTE — Progress Notes (Signed)
Patient ID: Howard CombesWalter C [name], male   DOB: 06/29/1935, 81 y.o.   MRN: [id number]                                                                PROGRESS NOTE                                                                                                                                                                                                             Patient Demographics:    Howard Torres, is a 81 y.o. male, DOB - 10/24/1935, WUJ:[id number]  Admit date - 02/08/2017   Admitting Physician Howard Bunhristina P Rama, MD  Outpatient Primary MD for the patient is Howard Torres, Howard Burch, MD  LOS - 2  Outpatient Specialists:     Chief Complaint  Patient presents with  . Groin Swelling  . Shortness of Breath       Brief Narrative   81 year old male with PMH of OSA (has not used CPAP in 2 years), chronic respiratory failure with hypoxia on 2 L/m home oxygen, chronic atrial fibrillation on Coumadin, status post pacemaker, Mild AS/ MR, COPD, chronic diastolic CHF (EF 95-6250-55 percent by TTE 08/25/16), HTN, pulmonary hypertension, tobacco abuse HLD, chronic suprapubic catheter (last changed 2 weeks ago, follows with Howard Torres, urology) presented with one-week history of progressive dyspnea and leg swelling despite increasing home dose of Lasix. Admitted for decompensated CHF. Improving.     Subjective:    Howard DaughtersWalter Torres today has been feeling better. Almost back to baseline.  No headache, No chest pain, No abdominal pain - No Nausea, No new weakness tingling or numbness, No Cough - Afebrile   Assessment  & Plan :    Principal Problem:   Acute on chronic combined systolic and diastolic CHF (congestive heart failure) (HCC) Active Problems:   OBESITY, NOS   Major depressive disorder, recurrent episode (HCC)   Tobacco use disorder   CAD (coronary artery disease)   Chronic pulmonary heart disease (HCC)   OSA (obstructive sleep apnea)   Long term (current) use of anticoagulants   Pacemaker   COPD GOLD  0   Aortic atherosclerosis (HCC)   1. Acute on chronic combined systolic and diastolic CHF: TTE 08/25/16: LVEF 50-55 percent. Grossly volume overloaded on admission. He was started on IV Lasix  60 mg every 6 hourly and metolazone 2.5 MG twice a day. -5 L since admission. Weight has not changed much,? Accuracy. Flat trend troponin likely demand ischemia. Reduce IV Lasix to 60 mg every 12 hours and metolazone to 2.5 MG daily. Losartan was added on admission. No beta blockers due to acute CHF.  2. Cardiac murmer , check echo 3. Elevated troponin: Mild and flat trend without chest pain. Likely demand ischemia. However will obtain repeat 2-D echo and if reduced EF or wall motion abnormalities, consider cardiology consultation. CT abdomen shows aortic atherosclerosis, at least 2 vessel CAD and calcification of aortic and mitral annulus. 4. Cirrhosis: Noted on CT abdomen. Some of his volume overload may be due to cirrhosis, hypoalbuminemia in addition to CHF.  5. Hypokalemia: Replace and follow. 6. OSA: Non-compliant with CPAP. 7. COPD/tobacco abuse: No clinical bronchospasm. Cessation counseled. He declines nicotine patch. 8. Chronic atrial fibrillation/pacemaker: V paced on monitor. INR subtherapeutic. Coumadin per pharmacy. 9. Essential hypertension: Controlled. Losartan added. On Imdur. 10. Chronic suprapubic catheter: Changed 2 weeks PTA. 11. Thrombocytopenia: May be chronic and intermittent. Platelets 113 in April 2016. Stable in the 130s since admission. Follow closely. 12. Overweight/Body mass index is 29.1 kg/m. 13. Depression: Stable.    DVT prophylaxis: SCDs. Coumadin per pharmacy. Code Status: Full Family Communication: Patient's son was sleeping at bedside, did not arouse him. Disposition: DC home when medically improved.   Consultants:  None   Procedures:  None  Antimicrobials:  None       Lab Results  Component Value Date   PLT 135 (L) 02/09/2017       Anti-infectives (From admission, onward)   None        Objective:   Vitals:   02/09/17 1226 02/09/17 2125 02/10/17 0030 02/10/17 0654  BP: (!) 105/52 (!) 101/54  (!) 120/58  Pulse: (!) 59 71  64  Resp: 20 18    Temp: 98.8 F (37.1 C) 98.9 F (37.2 C)  98.4 F (36.9 C)  TempSrc: Oral Oral  Oral  SpO2: 91% 94%  94%  Weight:   82.5 kg (181 lb 14.4 oz)   Height:        Wt Readings from Last 3 Encounters:  02/10/17 82.5 kg (181 lb 14.4 oz)  11/23/16 88 kg (194 lb)  08/24/16 87.4 kg (192 lb 9.6 oz)     Intake/Output Summary (Last 24 hours) at 02/10/2017 0932 Last data filed at 02/10/2017 0659 Gross per 24 hour  Intake 360 ml  Output 4100 ml  Net -3740 ml     Physical Exam  Awake Alert, Oriented X 3, No new F.N deficits, Normal affect [place].AT,PERRAL Supple Neck,No JVD, No cervical lymphadenopathy appriciated.  Symmetrical Chest wall movement, Good air movement bilaterally, CTAB RRR, s1, s2, 2/6 sem rusb, apex +ve B.Sounds, Abd Soft, No tenderness, No organomegaly appriciated, No rebound - guarding or rigidity. No Cyanosis, Clubbing or edema, No new Rash or bruise      Data Review:    CBC Recent Labs  Lab 02/08/17 1152 02/09/17 0534  WBC 6.4 5.5  HGB 12.1* 12.1*  HCT 40.8 40.8  PLT 137* 135*  MCV 91.3 91.5  MCH 27.1 27.1  MCHC 29.7* 29.7*  RDW 15.2 15.5    Chemistries  Recent Labs  Lab 02/08/17 1152 02/08/17 1828 02/09/17 0208 02/09/17 0904 02/10/17 0435  NA 133*  --  133*  --  135  K 3.8  --  3.5  --  3.7  CL 84*  --  81*  --  81*  CO2 40*  --  42*  --  45*  GLUCOSE 105*  --  95  --  97  BUN 24*  --  19  --  23*  CREATININE 1.20  --  1.04  --  1.18  CALCIUM 8.5*  --  8.5*  --  8.8*  MG  --   --   --  1.8  --   AST  --  27  --   --   --   ALT  --  16*  --   --   --   ALKPHOS  --  60  --   --   --   BILITOT  --  1.6*  --   --   --     ------------------------------------------------------------------------------------------------------------------ No results for input(s): CHOL, HDL, LDLCALC, TRIG, CHOLHDL, LDLDIRECT in the last 72 hours.  Lab Results  Component Value Date   HGBA1C  05/26/2008    5.8 (NOTE) The ADA recommends the following therapeutic goal for glycemic control related to Hgb A1c measurement: Goal of therapy: <6.5 Hgb A1c  Reference: American Diabetes Association: Clinical Practice Recommendations 2010, Diabetes Care, 2010, 33: (Suppl  1).   ------------------------------------------------------------------------------------------------------------------ No results for input(s): TSH, T4TOTAL, T3FREE, THYROIDAB in the last 72 hours.  Invalid input(s): FREET3 ------------------------------------------------------------------------------------------------------------------ No results for input(s): VITAMINB12, FOLATE, FERRITIN, TIBC, IRON, RETICCTPCT in the last 72 hours.  Coagulation profile Recent Labs  Lab 02/08/17 2206 02/09/17 0904 02/10/17 0435  INR 1.76 1.77 2.00    No results for input(s): DDIMER in the last 72 hours.  Cardiac Enzymes Recent Labs  Lab 02/08/17 2136 02/09/17 0208 02/09/17 0904  TROPONINI 0.04* 0.04* 0.05*   ------------------------------------------------------------------------------------------------------------------    Component Value Date/Time   BNP 233.5 (H) 02/08/2017 1152    Inpatient Medications  Scheduled Meds: . aspirin EC  81 mg Oral Daily  . furosemide  60 mg Intravenous BID  . guaiFENesin  600 mg Oral BID  . isosorbide mononitrate  30 mg Oral Daily  . losartan  25 mg Oral Daily  . metolazone  2.5 mg Oral Daily  . pantoprazole  40 mg Oral Daily  . potassium chloride  20 mEq Oral TID  . rosuvastatin  20 mg Oral Daily  . sodium chloride flush  3 mL Intravenous Q12H  . warfarin  7.5 mg Oral ONCE-1800  . Warfarin - Pharmacist Dosing Inpatient    Does not apply q1800   Continuous Infusions: . sodium chloride     PRN Meds:.sodium chloride, acetaminophen, ALPRAZolam, levalbuterol, nitroGLYCERIN, ondansetron (ZOFRAN) IV, oxyCODONE-acetaminophen **AND** oxyCODONE, sodium chloride, sodium chloride flush  Micro Results No results found for this or any previous visit (from the past 240 hour(s)).  Radiology Reports Dg Chest 2 View  Result Date: 02/08/2017 CLINICAL DATA:  Shortness of breath, bilateral lower extremity swelling EXAM: CHEST  2 VIEW COMPARISON:  08/16/2016 FINDINGS: There is mild left basilar atelectasis. There is a small right pleural effusion. There is no pneumothorax. The heart and mediastinum are stable. There is a single lead cardiac pacemaker. The osseous structures are unremarkable. IMPRESSION: Small left pleural effusion. Electronically Signed   By: Elige Ko   On: 02/08/2017 12:17   Ct Abdomen Pelvis W Contrast  Result Date: 02/08/2017 CLINICAL DATA:  81 year old male with history of pain in the lower pelvic region. Severe back pain for several weeks. EXAM: CT ABDOMEN AND PELVIS WITH CONTRAST TECHNIQUE: Multidetector CT imaging of  the abdomen and pelvis was performed using the standard protocol following bolus administration of intravenous contrast. CONTRAST:  100mL ISOVUE-300 IOPAMIDOL (ISOVUE-300) INJECTION 61% COMPARISON:  No priors. FINDINGS: Lower chest: Cardiomegaly. Severe thickening and calcification of the aortic valve. Calcification of the mitral annulus. Aortic atherosclerosis. Calcifications in the left anterior descending and left circumflex coronary arteries. Small right pleural effusion. Areas of scarring and/or atelectasis in the lung bases bilaterally (right greater than left). Hepatobiliary: Liver has a shrunken appearance and nodular contour, compatible with underlying cirrhosis. No definite cystic or solid hepatic lesions. No intra or extrahepatic biliary ductal dilatation. Multiple tiny calcified  gallstones lying dependently in the gallbladder. Gallbladder appears only moderately distended. No gallbladder wall thickening. Pancreas: No pancreatic mass. No pancreatic ductal dilatation. No pancreatic or peripancreatic fluid or inflammatory changes. Spleen: Unremarkable. Adrenals/Urinary Tract: Subcentimeter low-attenuation lesion in the lower pole the right kidney, too small to characterize, but statistically likely a cyst. Left kidney is normal in appearance. No hydroureteronephrosis. Urinary bladder is completely decompressed with an indwelling suprapubic Foley balloon catheter in place. Bilateral adrenal glands are normal in appearance. Stomach/Bowel: Normal appearance of the stomach. No pathologic dilatation of small bowel or colon. Numerous colonic diverticulae are noted, without surrounding inflammatory changes to suggest an acute diverticulitis at this time. The appendix is not confidently identified and may be surgically absent. Regardless, there are no inflammatory changes noted adjacent to the cecum to suggest the presence of an acute appendicitis at this time. Vascular/Lymphatic: Aortic atherosclerosis, without evidence of aneurysm or dissection in the abdominal or pelvic vasculature per no lymphadenopathy noted in the abdomen or pelvis. Reproductive: Prostate gland and seminal vesicles are unremarkable in appearance. Other: Small volume of ascites.  No pneumoperitoneum. 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 There are no other aggressive appearing lytic or blastic lesions noted in the visualized portions of the skeleton. IMPRESSION: 1. No definite acute findings are noted in the abdomen or pelvis. 2. However, there are morphologic changes in the liver indicative of cirrhosis. This is associated with a small volume of ascites. 3. Extensive colonic diverticulosis without definite  findings to suggest an acute diverticulitis at this time. 4. Cholelithiasis without evidence of acute cholecystitis at this time. 5. Small right pleural effusion with areas of scarring and/or atelectasis in the lung bases bilaterally (right greater than left). 6. Cardiomegaly. 7. Aortic atherosclerosis, in addition to at least 2 vessel coronary artery disease. Assessment for potential risk factor modification, dietary therapy or pharmacologic therapy may be warranted, if clinically indicated. 8. There are calcifications of the aortic valve and mitral annulus. Echocardiographic correlation for evaluation of potential valvular dysfunction may be warranted if clinically indicated. Aortic Atherosclerosis (ICD10-I70.0). Electronically Signed   By: Trudie Reedaniel  Entrikin M.D.   On: 02/08/2017 16:10    Time Spent in minutes  30   Pearson GrippeJames Ariday Brinker M.D on 02/10/2017 at 9:32 AM  Between 7am to 7pm - Pager - [phone]    After 7pm go to [email or web address] - password [email or web address]  [facility] -  Office  [phone]

## 2017-02-10 NOTE — Clinical Social Work Note (Signed)
Clinical Social Work Assessment  Patient Details  Name: Howard Torres. MRN: [id number] Date of Birth: 1935-04-11  Date of referral:  02/10/17               Reason for consult:  Transportation                Permission sought to share information with:    Permission granted to share information::  No  Name::        Agency::     Relationship::     Contact Information:     Housing/Transportation Living arrangements for the past 2 months:  Single Family Home Source of Information:  Patient, Medical Team Patient Interpreter Needed:  None Criminal Activity/Legal Involvement Pertinent to Current Situation/Hospitalization:  No - Comment as needed Significant Relationships:  Adult Children Lives with:  Adult Children Do you feel safe going back to the place where you live?  Yes Need for family participation in patient care:  Yes (Comment)  Care giving concerns:  Consult for picking up medications.   Social Worker assessment / plan:  CSW met with patient. Son asleep at bedside. CSW introduced role and notified patient of consult. Patient confirmed need. Patient has Medicaid so CSW encouraged patient to go to DSS and sign up for Medicaid transportation so that he will have a ride to appointments, the pharmacy, etc. Patient expressed understanding. No further concerns. CSW signing off as social work intervention is no longer needed.  Employment status:  Retired Forensic scientist:  Information systems manager, Medicaid In [place] PT Recommendations:  Not assessed at this time [place] / Referral to community resources:  Other (Comment Required)(DSS: Medicaid transportation.)  Patient/Family's Response to care:  Patient expressed understanding regarding Medicaid Transportation. Patient's children supportive and involved in patient's care. Patient appreciated social work intervention.  Patient/Family's Understanding of and Emotional Response to Diagnosis, Current Treatment, and Prognosis:  Patient has a  good understanding of the reason for admission and social work consult. Patient appears happy with hospital care.  Emotional Assessment Appearance:  Appears stated age Attitude/Demeanor/Rapport:  Other(Pleasant) Affect (typically observed):  Accepting, Appropriate, Calm, Pleasant Orientation:  Oriented to Self, Oriented to Place, Oriented to  Time, Oriented to Situation Alcohol / Substance use:  Tobacco Use Psych involvement (Current and /or in the community):  No (Comment)  Discharge Needs  Concerns to be addressed:  Care Coordination Readmission within the last 30 days:  No Current discharge risk:  None Barriers to Discharge:  Continued Medical Work up   Candie Chroman, LCSW 02/10/2017, 12:23 PM

## 2017-02-10 NOTE — Progress Notes (Signed)
ANTICOAGULATION CONSULT NOTE  Pharmacy Consult for warfarin (from PTA) Indication: atrial fibrillation  No Known Allergies  Patient Measurements: Height: 5\' 8"  (172.7 cm) Weight: 181 lb 14.4 oz (82.5 kg) IBW/kg (Calculated) : 68.4  Vital Signs: Temp: 98.4 F (36.9 C) (12/28 0654) Temp Source: Oral (12/28 0654) BP: 120/58 (12/28 0654) Pulse Rate: 64 (12/28 0654)  Labs: Recent Labs    02/08/17 1152 02/08/17 2136 02/08/17 2206 02/09/17 0208 02/09/17 0534 02/09/17 0904 02/10/17 0435  HGB 12.1*  --   --   --  12.1*  --   --   HCT 40.8  --   --   --  40.8  --   --   PLT 137*  --   --   --  135*  --   --   LABPROT  --   --  20.4*  --   --  20.5* 22.5*  INR  --   --  1.76  --   --  1.77 2.00  CREATININE 1.20  --   --  1.04  --   --  1.18  TROPONINI  --  0.04*  --  0.04*  --  0.05*  --     Estimated Creatinine Clearance: 53.1 mL/min (by C-G formula based on SCr of 1.18 mg/dL).   Medical History: Past Medical History:  Diagnosis Date  . Chest pain syndrome   . CHF (congestive heart failure) (HCC)   . COPD (chronic obstructive pulmonary disease) (HCC)   . Dyslipidemia   . GERD (gastroesophageal reflux disease)   . OSA (obstructive sleep apnea)   . Pacemaker   . Permanent atrial fibrillation (HCC)   . Pulmonary hypertension [facility](HCC)    Assessment: 81 yo male admitted with SOB/leg swelling and noted w/ HF. On warfarin PTA for afib, pharmacy consulted to dose.  -INR= 2.0  PTA warfarin dose: 7.5mg  MWF and 5mg  all other days  (Admit INR 1.76)   Goal of Therapy:  INR 2-3 Monitor platelets by anticoagulation protocol: Yes   Plan:  Warfarin 7.5mg  po x1 Daily INR  Harland GermanAndrew Cydnie Deason, Pharm D 02/10/2017 8:25 AM

## 2017-02-10 NOTE — Progress Notes (Signed)
Pt is alert and oriented sitting on the side of the bed eating breakfast PRN given for chronic back pain.

## 2017-02-10 NOTE — Progress Notes (Signed)
  Echocardiogram 2D Echocardiogram has been performed.  Tanvir Hipple T Naina Sleeper 02/10/2017, 10:23 AM

## 2017-02-10 NOTE — Progress Notes (Signed)
Nutrition Education Note  RD consulted for nutrition education regarding new onset CHF.  RD provided "Heart Failure Nutrition Therapy" handout from the Academy of Nutrition and Dietetics. Reviewed patient's dietary recall. Pt currently reports he eats a lot of "country foods" and sometimes bring food in like chicken wings and pizza. Provided examples on ways to decrease sodium intake in diet. Discouraged intake of processed foods and use of salt shaker. Provide other examples of ways to season foods including using herbs/spices, garlic, onions or vinegars.  Encouraged fresh fruits and vegetables as well as whole grain sources of carbohydrates to maximize fiber intake.   RD discussed why it is important for patient to adhere to diet recommendations, and emphasized the role of fluids, foods to avoid, and importance of weighing self daily. Teach back method used.  Expect fair compliance.  Body mass index is 27.66 kg/m.   Current diet order is 2g sodium. Pt reports good appetite currently and PTA. Pt reports no weight loss. Labs and medications reviewed. No further nutrition interventions warranted at this time. RD contact information provided. If additional nutrition issues arise, please re-consult RD.   Howard Starcherate Irja Wheless MS, RD, LDN, CNSC [phone] Pager  [phone] Weekend/On-Call Pager

## 2017-02-10 NOTE — Evaluation (Signed)
Physical Therapy Evaluation Patient Details Name: Howard CombesWalter C Donson Jr. MRN: [id number] DOB: 05/09/1935 Today's Date: 02/10/2017   History of Present Illness  81 yo male with onset of leg edema and SOB was admitted, has CHF with PT ordered to ck for home mobility.  PMHx:  OSA, chronic respiratory failure on home O2, a-fib, pacemaker, COPD, CHF, suprapubic catheter,   Clinical Impression  Pt is getting up to walk with PT and noted his comfortable progression of movement to get on walker and control his pace.  Has been getting up with family supervising and has not needed an AD but now wants walker with 4 wheels.  Have responded that PT will need to see him on the walker to decide if this is safe, but can definitely approve a 2 wheeled walker.  Has been on home O2 chronically and the seat on the walker would be convenient if this is appropriate.    Follow Up Recommendations Home health PT;Supervision for mobility/OOB    Equipment Recommendations  Rolling walker with 5" wheels    Recommendations for Other Services       Precautions / Restrictions Precautions Precautions: Fall(telemetry) Precaution Comments: ck vitals with therapy Restrictions Weight Bearing Restrictions: No      Mobility  Bed Mobility Overal bed mobility: Needs Assistance Bed Mobility: Supine to Sit;Sit to Supine     Supine to sit: Min assist Sit to supine: Min guard   General bed mobility comments: min assist to support trunk for OOB  Transfers Overall transfer level: Needs assistance Equipment used: Rolling walker (2 wheeled);1 person hand held assist Transfers: Sit to/from Stand Sit to Stand: Min assist         General transfer comment: tends to use both arms on walker  Ambulation/Gait Ambulation/Gait assistance: Min guard;Min assist Ambulation Distance (Feet): 150 Feet Assistive device: Rolling walker (2 wheeled);1 person hand held assist Gait Pattern/deviations: Step-through pattern;Decreased  stride length;Wide base of support;Trunk flexed;Drifts right/left Gait velocity: reduced Gait velocity interpretation: Below normal speed for age/gender General Gait Details: tends to turn and leave himself out of walker but corrects with minor help  Stairs            Wheelchair Mobility    Modified Rankin (Stroke Patients Only)       Balance Overall balance assessment: Needs assistance Sitting-balance support: Feet supported Sitting balance-Leahy Scale: Good     Standing balance support: Bilateral upper extremity supported;During functional activity Standing balance-Leahy Scale: Fair Standing balance comment: less than fair dynamic assistance                             Pertinent Vitals/Pain Pain Assessment: No/denies pain    Home Living Family/patient expects to be discharged to:: Private residence Living Arrangements: Children Available Help at Discharge: Family;Available 24 hours/day Type of Home: House Home Access: Level entry     Home Layout: One level Home Equipment: None Additional Comments: needs RW    Prior Function Level of Independence: Independent               Hand Dominance   Dominant Hand: Right    Extremity/Trunk Assessment   Upper Extremity Assessment Upper Extremity Assessment: Overall WFL for tasks assessed    Lower Extremity Assessment Lower Extremity Assessment: Generalized weakness    Cervical / Trunk Assessment Cervical / Trunk Assessment: Normal  Communication   Communication: No difficulties  Cognition Arousal/Alertness: Awake/alert Behavior During Therapy: WFL for tasks  assessed/performed Overall Cognitive Status: Within Functional Limits for tasks assessed                                        General Comments      Exercises     Assessment/Plan    PT Assessment Patient needs continued PT services  PT Problem List Decreased strength;Decreased range of motion;Decreased  activity tolerance;Decreased balance;Decreased mobility;Decreased coordination;Decreased knowledge of use of DME;Decreased safety awareness;Cardiopulmonary status limiting activity;Obesity       PT Treatment Interventions DME instruction;Gait training;Functional mobility training;Therapeutic activities;Therapeutic exercise;Balance training;Neuromuscular re-education;Patient/family education    PT Goals (Current goals can be found in the Care Plan section)  Acute Rehab PT Goals Patient Stated Goal: to get stronger and get home PT Goal Formulation: With patient/family Time For Goal Achievement: 02/24/17 Potential to Achieve Goals: Good    Frequency Min 4X/week   Barriers to discharge   needs gait support for initial return home    Co-evaluation               AM-PAC PT "6 Clicks" Daily Activity  Outcome Measure Difficulty turning over in bed (including adjusting bedclothes, sheets and blankets)?: A Little Difficulty moving from lying on back to sitting on the side of the bed? : Unable Difficulty sitting down on and standing up from a chair with arms (e.g., wheelchair, bedside commode, etc,.)?: Unable Help needed moving to and from a bed to chair (including a wheelchair)?: A Little Help needed walking in hospital room?: A Little Help needed climbing 3-5 steps with a railing? : Total 6 Click Score: 12    End of Session Equipment Utilized During Treatment: Gait belt;Oxygen(3L) Activity Tolerance: Patient tolerated treatment well;Patient limited by fatigue Patient left: in bed;with call bell/phone within reach;with family/visitor present Nurse Communication: Mobility status PT Visit Diagnosis: Unsteadiness on feet (R26.81);Muscle weakness (generalized) (M62.81)    Time: [id number] PT Time Calculation (min) (ACUTE ONLY): 26 min   Charges:   PT Evaluation $PT Eval Moderate Complexity: 1 Mod PT Treatments $Gait Training: 8-22 mins   PT G Codes:   PT G-Codes **NOT FOR  INPATIENT CLASS** Functional Assessment Tool Used: AM-PAC 6 Clicks Basic Mobility    Howard Torres 02/10/2017, 1:49 PM   Howard Torres, PT MS Acute Rehab Dept. Number: [facility] [id number] and [facility] [phone]

## 2017-02-11 DIAGNOSIS — R609 Edema, unspecified: Secondary | ICD-10-CM

## 2017-02-11 DIAGNOSIS — I272 Pulmonary hypertension, unspecified: Secondary | ICD-10-CM

## 2017-02-11 DIAGNOSIS — J449 Chronic obstructive pulmonary disease, unspecified: Secondary | ICD-10-CM

## 2017-02-11 LAB — COMPREHENSIVE METABOLIC PANEL
ALK PHOS: 59 U/L (ref 38–126)
ALT: 14 U/L — AB (ref 17–63)
AST: 27 U/L (ref 15–41)
Albumin: 3 g/dL — ABNORMAL LOW (ref 3.5–5.0)
Anion gap: 14 (ref 5–15)
BUN: 24 mg/dL — AB (ref 6–20)
CALCIUM: 8.5 mg/dL — AB (ref 8.9–10.3)
CHLORIDE: 77 mmol/L — AB (ref 101–111)
CO2: 41 mmol/L — AB (ref 22–32)
CREATININE: 1.25 mg/dL — AB (ref 0.61–1.24)
GFR, EST NON AFRICAN AMERICAN: 53 mL/min — AB (ref >60)
Glucose, Bld: 90 mg/dL (ref 65–99)
Potassium: 4.1 mmol/L (ref 3.5–5.1)
SODIUM: 132 mmol/L — AB (ref 135–145)
Total Bilirubin: 1.5 mg/dL — ABNORMAL HIGH (ref 0.3–1.2)
Total Protein: 5.9 g/dL — ABNORMAL LOW (ref 6.5–8.1)

## 2017-02-11 LAB — CBC
HCT: 43 % (ref 39.0–52.0)
HEMOGLOBIN: 12.5 g/dL — AB (ref 13.0–17.0)
MCH: 26.9 pg (ref 26.0–34.0)
MCHC: 29.1 g/dL — ABNORMAL LOW (ref 30.0–36.0)
MCV: 92.7 fL (ref 78.0–100.0)
PLATELETS: 151 10*3/uL (ref 150–400)
RBC: 4.64 MIL/uL (ref 4.22–5.81)
RDW: 16 % — ABNORMAL HIGH (ref 11.5–15.5)
WBC: 8.2 10*3/uL (ref 4.0–10.5)

## 2017-02-11 LAB — PROTIME-INR
INR: 2.53
Prothrombin Time: 27 seconds — ABNORMAL HIGH (ref 11.4–15.2)

## 2017-02-11 MED ORDER — POTASSIUM CHLORIDE CRYS ER 20 MEQ PO TBCR: 20.0000 meq | EXTENDED_RELEASE_TABLET | Freq: Two times a day (BID) | ORAL | 0 refills | Status: DC

## 2017-02-11 MED ORDER — FUROSEMIDE 20 MG PO TABS: 60.0000 mg | ORAL_TABLET | Freq: Two times a day (BID) | ORAL | 0 refills | Status: DC

## 2017-02-11 MED ORDER — FUROSEMIDE 40 MG PO TABS: 60.0000 mg | ORAL_TABLET | Freq: Two times a day (BID) | ORAL | Status: DC

## 2017-02-11 MED ORDER — POTASSIUM CHLORIDE CRYS ER 20 MEQ PO TBCR: 20.0000 meq | EXTENDED_RELEASE_TABLET | Freq: Three times a day (TID) | ORAL | 0 refills | Status: DC

## 2017-02-11 MED ORDER — MAGNESIUM HYDROXIDE 400 MG/5ML PO SUSP
30.0000 mL | Freq: Once | ORAL | Status: AC
  Administered 2017-02-11: 30 mL via ORAL
  Filled 2017-02-11: qty 30

## 2017-02-11 MED ORDER — LOSARTAN POTASSIUM 25 MG PO TABS: 12.5000 mg | ORAL_TABLET | Freq: Every day | ORAL | 0 refills | Status: DC

## 2017-02-11 NOTE — Progress Notes (Signed)
Case management came to bedside  Pt agreeable to wait for walker and shower stool  [facility] at bedside now delivering

## 2017-02-11 NOTE — Discharge Summary (Addendum)
Colbin Jovel., is a 81 y.o. male  DOB 10-23-1935  MRN [id number].  Admission date:  02/08/2017  Admitting Physician  Maryruth Bun Rama, MD  Discharge Date:  02/11/2017   Primary MD  Aida Puffer, MD  Recommendations for primary care physician for things to follow:   Edema secondary to ? Pulmonary hypertension, vs CHF Cont lasix 60mg  po bid Cont potassium chloride po bid Check cmp in 1 week  CHF (diastolic) Added cozaar 12.5mg  po qday Cont lasix  Home health RN for disease state  AS (moderate), slightly worse Please f/u with cardiology in 1 week  Chronic afib/ pacer Cont coumadin, check INR in 1 week  Mild trop elevation, w/o chest pain Thought to be due to demand ischemia  Pulmonary hypertension (moderate) Copd/ tobacco use OSA noncompliant with cpap Pcp to consider pulmonary outpatient consult  Thrombocytopenia Please check cbc in 1 week  Cirrhosis, noted on CT abdomen Check cmp in  1 week  Hypokalemia Check cmp in  1 week  Depression stable  Gait instabilty Walker ordered Home PT to evaluate and tx     Admission Diagnosis  Dyspnea, unspecified type [R06.00] Edema  Discharge Diagnosis  Dyspnea, unspecified type [R06.00]    Principal Problem:   Acute on chronic combined systolic and diastolic CHF (congestive heart failure) (HCC) Active Problems:   OBESITY, NOS   Major depressive disorder, recurrent episode (HCC)   Tobacco use disorder   CAD (coronary artery disease)   Chronic pulmonary heart disease (HCC)   OSA (obstructive sleep apnea)   Long term (current) use of anticoagulants   Pacemaker   COPD GOLD 0   Aortic atherosclerosis (HCC)      Past Medical History:  Diagnosis Date  . Chest pain syndrome   . CHF (congestive heart failure) (HCC)   . COPD (chronic obstructive pulmonary disease) (HCC)   . Dyslipidemia   . GERD (gastroesophageal  reflux disease)   . OSA (obstructive sleep apnea)   . Pacemaker   . Permanent atrial fibrillation (HCC)   . Pulmonary hypertension (HCC)     Past Surgical History:  Procedure Laterality Date  . HERNIA REPAIR    . NM MYOCAR PERF WALL MOTION  11/25/2008   normal  . PERMANENT PACEMAKER INSERTION  11/16/2009   medtronic  . PROSTATE SURGERY         HPI  from the history and physical done on the day of admission:     81 year old male with PMH of OSA (has not used CPAP in 2 years), chronic respiratory failure with hypoxia on 2 L/m home oxygen, chronic atrial fibrillation on Coumadin, status post pacemaker, Mild AS/ MR, COPD, chronic diastolic CHF (EF 40-98 percent by TTE 08/25/16), HTN, pulmonary hypertension, tobacco abuse HLD, chronic suprapubic catheter (last changed 2 weeks ago, follows with Dr. Cassell Smiles, urology) presented with one-week history of progressive dyspnea and leg swelling despite increasing home dose of Lasix. Admitted for decompensated CHF. Improving.  Hospital Course:     Pt was admitted for edema, thought to be related to CHF , pt started on lasix 80mg  iv q6h and zarololyn 2.5mg  po bid, which was deescalated on the 2nd day to lasix 60mg  iv bid and zaroxolyn 2.5mg  po qday.  Pt was also started on potassium supplementation and cozaar 25mg  po qday was added. Pt stated that his edema improved. The cozaar was decreased to 12.5mg  po qday  Cardiac echo 8/28=> EF 50-55%, moderate AS, and mild MR, and moderate pulmonary hypertension  His lasix was further decreased to 60mg  po bid.  Pt was -4.5L during this admission.  Pt feels like his edema has resolved and breathing is fine.    Follow UP  Follow-up Information    [facility].   Why:  Arranged PT.  Will call you to set up visits. Contact information: [address] [place] [address] [phone]        [facility] - Dme Follow up.   Why:  Arranged walker  (rollator) and shower chair to be delivered to room prior to discharge. Contact information: [address] [place] [address] [phone]        Aida PufferLittle, Dakhari Zuver, MD Follow up in 1 week(s).   Specialty:  Family Medicine Contact information: [address] [place] [address] [phone]        Croitoru, Rachelle HoraMihai, MD Follow up in 1 week(s).   Specialty:  Cardiology Contact information: [address] [place] [address] [phone]            Consults obtained - none  Discharge Condition: stable  Diet and Activity recommendation: See Discharge Instructions below  Discharge Instructions         Discharge Medications     Allergies as of 02/11/2017   No Known Allergies     Medication List    TAKE these medications   albuterol 108 (90 Base) MCG/ACT inhaler Commonly known as:  PROVENTIL HFA;VENTOLIN HFA Inhale 2 puffs into the lungs every 6 (six) hours as needed for wheezing or shortness of breath.   ALPRAZolam 1 MG tablet Commonly known as:  XANAX Take 0.5-1 mg by mouth daily as needed for anxiety.   cyanocobalamin 1000 MCG/ML injection Commonly known as:  (VITAMIN B-12) Inject 1,000 mcg into the muscle every 30 (thirty) days.   furosemide 20 MG tablet Commonly known as:  LASIX Take 3 tablets (60 mg total) by mouth 2 (two) times daily. What changed:    medication strength  how much to take  when to take this  additional instructions   isosorbide mononitrate 30 MG 24 hr tablet Commonly known as:  IMDUR Take 1 tablet (30 mg total) by mouth daily.   ketoconazole 2 % shampoo Commonly known as:  NIZORAL Apply 1 application topically every other day.   losartan 25 MG tablet Commonly known as:  COZAAR Take 0.5 tablets (12.5 mg total) by mouth daily.   nitroGLYCERIN 0.4 MG SL tablet Commonly known as:  NITROSTAT Place 1 tablet (0.4 mg total) under the tongue every 5 (five) minutes as needed for chest pain.     omeprazole 20 MG capsule Commonly known as:  PRILOSEC Take 20 mg by mouth daily.   oxyCODONE-acetaminophen 10-325 MG tablet Commonly known as:  PERCOCET Take 1 tablet by mouth every 6 (six) hours as needed for pain.   potassium chloride SA 20 MEQ tablet Commonly known as:  K-DUR,KLOR-CON Take 1 tablet (20 mEq total) by mouth 2 (two) times daily.   rosuvastatin 20 MG tablet Commonly known as:  CRESTOR Take 20 mg by mouth daily.   sodium chloride 0.65 % nasal spray Commonly known as:  OCEAN Place 1 spray into the nose as needed for congestion.   Vitamin D (Ergocalciferol) 50000 units Caps capsule Commonly known as:  DRISDOL Take 50,000 Units by mouth every 7 (seven) days. Wednesdays.   warfarin 5 MG tablet Commonly known as:  COUMADIN Take as directed. If you are unsure how to take this medication, talk to your nurse or doctor. Original instructions:  TAKE 1 TO 1 AND 1/2 TABLETS BY MOUTH DAILY AS DIRECTED BY COUMADIN CLINIC            Durable Medical Equipment  (From admission, onward)        Start     Ordered   02/10/17 1600  For home use only DME Walker  Once    Comments:  Rollator  Question:  Patient needs a walker to treat with the following condition  Answer:  CHF (congestive heart failure) (HCC)   02/10/17 1601   02/10/17 1548  For home use only DME Shower stool  Once     02/10/17 1547      Major procedures and Radiology Reports - PLEASE review detailed and final reports for all details, in brief -      Dg Chest 2 View  Result Date: 02/08/2017 CLINICAL DATA:  Shortness of breath, bilateral lower extremity swelling EXAM: CHEST  2 VIEW COMPARISON:  08/16/2016 FINDINGS: There is mild left basilar atelectasis. There is a small right pleural effusion. There is no pneumothorax. The heart and mediastinum are stable. There is a single lead cardiac pacemaker. The osseous structures are unremarkable. IMPRESSION: Small left pleural effusion. Electronically Signed    By: Elige Ko   On: 02/08/2017 12:17   Ct Abdomen Pelvis W Contrast  Result Date: 02/08/2017 CLINICAL DATA:  81 year old male with history of pain in the lower pelvic region. Severe back pain for several weeks. EXAM: CT ABDOMEN AND PELVIS WITH CONTRAST TECHNIQUE: Multidetector CT imaging of the abdomen and pelvis was performed using the standard protocol following bolus administration of intravenous contrast. CONTRAST:  ISOVUE-300 IOPAMIDOL (ISOVUE-300) INJECTION 61% COMPARISON:  No priors. FINDINGS: Lower chest: Cardiomegaly. Severe thickening and calcification of the aortic valve. Calcification of the mitral annulus. Aortic atherosclerosis. Calcifications in the left anterior descending and left circumflex coronary arteries. Small right pleural effusion. Areas of scarring and/or atelectasis in the lung bases bilaterally (right greater than left). Hepatobiliary: Liver has a shrunken appearance and nodular contour, compatible with underlying cirrhosis. No definite cystic or solid hepatic lesions. No intra or extrahepatic biliary ductal dilatation. Multiple tiny calcified gallstones lying dependently in the gallbladder. Gallbladder appears only moderately distended. No gallbladder wall thickening. Pancreas: No pancreatic mass. No pancreatic ductal dilatation. No pancreatic or peripancreatic fluid or inflammatory changes. Spleen: Unremarkable. Adrenals/Urinary Tract: Subcentimeter low-attenuation lesion in the lower pole the right kidney, too small to characterize, but statistically likely a cyst. Left kidney is normal in appearance. No hydroureteronephrosis. Urinary bladder is completely decompressed with an indwelling suprapubic Foley balloon catheter in place. Bilateral adrenal glands are normal in appearance. Stomach/Bowel: Normal appearance of the stomach. No pathologic dilatation of small bowel or colon. Numerous colonic diverticulae are noted, without surrounding inflammatory changes to suggest an  acute diverticulitis at this time. The appendix is not confidently identified and may  be surgically absent. Regardless, there are no inflammatory changes noted adjacent to the cecum to suggest the presence of an acute appendicitis at this time. Vascular/Lymphatic: Aortic atherosclerosis, without evidence of aneurysm or dissection in the abdominal or pelvic vasculature per no lymphadenopathy noted in the abdomen or pelvis. Reproductive: Prostate gland and seminal vesicles are unremarkable in appearance. Other: Small volume of ascites.  No pneumoperitoneum. 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 There are no other aggressive appearing lytic or blastic lesions noted in the visualized portions of the skeleton. IMPRESSION: 1. No definite acute findings are noted in the abdomen or pelvis. 2. However, there are morphologic changes in the liver indicative of cirrhosis. This is associated with a small volume of ascites. 3. Extensive colonic diverticulosis without definite findings to suggest an acute diverticulitis at this time. 4. Cholelithiasis without evidence of acute cholecystitis at this time. 5. Small right pleural effusion with areas of scarring and/or atelectasis in the lung bases bilaterally (right greater than left). 6. Cardiomegaly. 7. Aortic atherosclerosis, in addition to at least 2 vessel coronary artery disease. Assessment for potential risk factor modification, dietary therapy or pharmacologic therapy may be warranted, if clinically indicated. 8. There are calcifications of the aortic valve and mitral annulus. Echocardiographic correlation for evaluation of potential valvular dysfunction may be warranted if clinically indicated. Aortic Atherosclerosis (ICD10-I70.0). Electronically Signed   By: Trudie Reed M.D.   On: 02/08/2017 16:10    Micro Results     No results found for this or  any previous visit (from the past 240 hour(s)).     Today   Subjective    Richey Doolittle today states edema is doing great.  Pt denies cp, palp, dyspnea orthopnea, pnd.    no headache,no chest abdominal pain,no new weakness tingling or numbness, feels much better wants to go home today.   Objective   Blood pressure (!) 96/49, pulse 61, temperature (!) 97.4 F (36.3 C), temperature source Oral, resp. rate 18, height 5\' 8"  (1.727 m), weight 82.1 kg (181 lb), SpO2 96 %.   Intake/Output Summary (Last 24 hours) at 02/11/2017 0725 Last data filed at 02/11/2017 0320 Gross per 24 hour  Intake 720 ml  Output 2600 ml  Net -1880 ml    Exam Awake Alert, Oriented x 3, No new F.N deficits, Normal affect .AT,PERRAL Supple Neck,No JVD, No cervical lymphadenopathy appriciated.  Symmetrical Chest wall movement, Good air movement bilaterally, CTAB RRR,No Gallops,Rubs or new Murmurs, No Parasternal Heave +ve B.Sounds, Abd Soft, Non tender, No organomegaly appriciated, No rebound -guarding or rigidity. No Cyanosis, Clubbing or edema, No new Rash or bruise Gait instability, will need walker upon discharge   Data Review   CBC w Diff:  Lab Results  Component Value Date   WBC 8.2 02/11/2017   HGB 12.5 (L) 02/11/2017   HGB 13.3 08/16/2016   HCT 43.0 02/11/2017   HCT 41.9 08/16/2016   PLT 151 02/11/2017   PLT 171 08/16/2016   LYMPHOPCT 28 04/10/2012   MONOPCT 8 04/10/2012   EOSPCT 2 04/10/2012   BASOPCT 0 04/10/2012    CMP:  Lab Results  Component Value Date   NA 132 (L) 02/11/2017   NA 138 08/16/2016   K 4.1 02/11/2017   CL 77 (L) 02/11/2017   CO2 41 (H) 02/11/2017   BUN 24 (H) 02/11/2017   BUN 26 08/16/2016   CREATININE 1.25 (  H) 02/11/2017   CREATININE 1.03 05/29/2014   PROT 5.9 (L) 02/11/2017   PROT 6.6 08/16/2016   ALBUMIN 3.0 (L) 02/11/2017   ALBUMIN 4.0 08/16/2016   BILITOT 1.5 (H) 02/11/2017   BILITOT 0.6 08/16/2016   ALKPHOS 59 02/11/2017   AST 27 02/11/2017    ALT 14 (L) 02/11/2017  .   Total Time in preparing paper work, data evaluation and todays exam - 35 minutes  Pearson GrippeJames Vandella Ord M.D on 02/11/2017 at 7:25 AM  [facility]   Office  [phone]

## 2017-02-11 NOTE — Progress Notes (Addendum)
Pt dressed, took off telemetry by himself and wanting to leave. Pt refusing assessment. Pt does not want to wait for DME walker or shower chair, MD Kim aware. Informed case management as well. Pt IV discontinued and catheter intact. Pt has all printed prescriptions and discharge teaching provided at bedside to pt and pt son. Pt wears home oxygen. Pt states he has oxygen in the car and can make it. Pt states he does not want to wait any longer.

## 2017-02-11 NOTE — Care Management (Signed)
Discussed equipment and HH options with pt and son.  Pt refused HH PT. [facility]  [facility] contacted about delivery of walker and 3in1.  Equipment delivered.  [facility] contacted to change order from PT to RN.

## 2017-02-11 NOTE — Progress Notes (Signed)
Pt discharged via wheelchair with nurse staff  Pt refusing oxygen for transfer to car  Pt states he has oxygen in the car  Pt has all belongings including printed prescriptions, walker, shower chair, cane and dentures

## 2017-02-16 ENCOUNTER — Ambulatory Visit (INDEPENDENT_AMBULATORY_CARE_PROVIDER_SITE_OTHER): Payer: Medicare Other | Admitting: Pharmacist Clinician (PhC)/ Clinical Pharmacy Specialist

## 2017-02-16 DIAGNOSIS — Z7901 Long term (current) use of anticoagulants: Secondary | ICD-10-CM | POA: Diagnosis not present

## 2017-02-16 DIAGNOSIS — I4891 Unspecified atrial fibrillation: Secondary | ICD-10-CM

## 2017-02-16 LAB — POCT INR: INR: 1.5

## 2017-02-22 ENCOUNTER — Telehealth: Payer: Self-pay | Admitting: Cardiology

## 2017-02-22 ENCOUNTER — Encounter: Payer: Medicare Other | Admitting: *Deleted

## 2017-02-22 NOTE — Telephone Encounter (Signed)
Spoke with pt and reminded pt of remote transmission that is due today. Pt verbalized understanding.   

## 2017-02-23 ENCOUNTER — Encounter: Payer: Self-pay | Admitting: Cardiology

## 2017-03-02 ENCOUNTER — Ambulatory Visit (INDEPENDENT_AMBULATORY_CARE_PROVIDER_SITE_OTHER): Payer: Medicare Other | Admitting: *Deleted

## 2017-03-02 DIAGNOSIS — I4891 Unspecified atrial fibrillation: Secondary | ICD-10-CM

## 2017-03-03 ENCOUNTER — Ambulatory Visit (INDEPENDENT_AMBULATORY_CARE_PROVIDER_SITE_OTHER): Payer: Medicare Other | Admitting: Pharmacist Clinician (PhC)/ Clinical Pharmacy Specialist

## 2017-03-03 ENCOUNTER — Encounter: Payer: Self-pay | Admitting: Cardiology

## 2017-03-03 DIAGNOSIS — Z7901 Long term (current) use of anticoagulants: Secondary | ICD-10-CM

## 2017-03-03 DIAGNOSIS — I4891 Unspecified atrial fibrillation: Secondary | ICD-10-CM

## 2017-03-03 LAB — POCT INR: INR: 1.4

## 2017-03-03 NOTE — Progress Notes (Signed)
Remote pacemaker transmission.   

## 2017-03-06 ENCOUNTER — Ambulatory Visit (INDEPENDENT_AMBULATORY_CARE_PROVIDER_SITE_OTHER): Payer: Medicare Other | Admitting: Pharmacist Clinician (PhC)/ Clinical Pharmacy Specialist

## 2017-03-06 DIAGNOSIS — I4891 Unspecified atrial fibrillation: Secondary | ICD-10-CM

## 2017-03-06 DIAGNOSIS — Z7901 Long term (current) use of anticoagulants: Secondary | ICD-10-CM

## 2017-03-06 LAB — POCT INR: INR: 1.4

## 2017-03-15 ENCOUNTER — Ambulatory Visit (INDEPENDENT_AMBULATORY_CARE_PROVIDER_SITE_OTHER): Payer: Medicare Other | Admitting: Pharmacist Clinician (PhC)/ Clinical Pharmacy Specialist

## 2017-03-15 DIAGNOSIS — Z7901 Long term (current) use of anticoagulants: Secondary | ICD-10-CM

## 2017-03-15 DIAGNOSIS — I4891 Unspecified atrial fibrillation: Secondary | ICD-10-CM

## 2017-03-15 LAB — POCT INR: INR: 1.5

## 2017-03-16 LAB — CUP PACEART REMOTE DEVICE CHECK
Date Time Interrogation Session: 20190117203528
Lead Channel Impedance Value: 0 Ohm
Lead Channel Pacing Threshold Amplitude: 0.625 V
Lead Channel Pacing Threshold Pulse Width: 0.4 ms
Lead Channel Setting Pacing Amplitude: 2.5 V
MDC IDC LEAD IMPLANT DT: 20111003
MDC IDC LEAD LOCATION: 753860
MDC IDC MSMT BATTERY IMPEDANCE: 2350 Ohm
MDC IDC MSMT BATTERY REMAINING LONGEVITY: 30 mo
MDC IDC MSMT BATTERY VOLTAGE: 2.75 V
MDC IDC MSMT LEADCHNL RV IMPEDANCE VALUE: 555 Ohm
MDC IDC PG IMPLANT DT: 20111003
MDC IDC SET LEADCHNL RV PACING PULSEWIDTH: 0.4 ms
MDC IDC SET LEADCHNL RV SENSING SENSITIVITY: 2.8 mV
MDC IDC STAT BRADY RV PERCENT PACED: 97 %

## 2017-03-27 ENCOUNTER — Ambulatory Visit (INDEPENDENT_AMBULATORY_CARE_PROVIDER_SITE_OTHER): Payer: Medicare Other | Admitting: Pharmacist Clinician (PhC)/ Clinical Pharmacy Specialist

## 2017-03-27 DIAGNOSIS — Z7901 Long term (current) use of anticoagulants: Secondary | ICD-10-CM | POA: Diagnosis not present

## 2017-03-27 DIAGNOSIS — I4891 Unspecified atrial fibrillation: Secondary | ICD-10-CM | POA: Diagnosis not present

## 2017-03-27 LAB — POCT INR: INR: 1.8

## 2017-04-11 ENCOUNTER — Ambulatory Visit (INDEPENDENT_AMBULATORY_CARE_PROVIDER_SITE_OTHER): Payer: Medicare Other | Admitting: Pharmacist Clinician (PhC)/ Clinical Pharmacy Specialist

## 2017-04-11 DIAGNOSIS — Z7901 Long term (current) use of anticoagulants: Secondary | ICD-10-CM

## 2017-04-11 DIAGNOSIS — I4891 Unspecified atrial fibrillation: Secondary | ICD-10-CM

## 2017-04-11 LAB — POCT INR: INR: 1.7

## 2017-04-11 LAB — PROTIME-INR

## 2017-05-03 ENCOUNTER — Ambulatory Visit (INDEPENDENT_AMBULATORY_CARE_PROVIDER_SITE_OTHER): Payer: Medicare Other | Admitting: Pharmacist

## 2017-05-03 DIAGNOSIS — I4891 Unspecified atrial fibrillation: Secondary | ICD-10-CM

## 2017-05-03 DIAGNOSIS — Z7901 Long term (current) use of anticoagulants: Secondary | ICD-10-CM

## 2017-05-03 LAB — POCT INR: INR: 1.7

## 2017-05-15 ENCOUNTER — Emergency Department (HOSPITAL_COMMUNITY): Payer: Medicare Other

## 2017-05-15 ENCOUNTER — Encounter (HOSPITAL_COMMUNITY): Payer: Self-pay | Admitting: Emergency Medicine

## 2017-05-15 ENCOUNTER — Inpatient Hospital Stay (HOSPITAL_COMMUNITY)
Admission: EM | Admit: 2017-05-15 | Discharge: 2017-05-20 | DRG: 291 | Disposition: A | Discharge status: Home Health | Payer: Medicare Other | Attending: Internal Medicine | Admitting: Internal Medicine

## 2017-05-15 DIAGNOSIS — Z716 Tobacco abuse counseling: Secondary | ICD-10-CM

## 2017-05-15 DIAGNOSIS — E7800 Pure hypercholesterolemia, unspecified: Secondary | ICD-10-CM | POA: Diagnosis present

## 2017-05-15 DIAGNOSIS — Z8249 Family history of ischemic heart disease and other diseases of the circulatory system: Secondary | ICD-10-CM

## 2017-05-15 DIAGNOSIS — R627 Adult failure to thrive: Secondary | ICD-10-CM | POA: Diagnosis present

## 2017-05-15 DIAGNOSIS — Z801 Family history of malignant neoplasm of trachea, bronchus and lung: Secondary | ICD-10-CM

## 2017-05-15 DIAGNOSIS — N401 Enlarged prostate with lower urinary tract symptoms: Secondary | ICD-10-CM | POA: Diagnosis present

## 2017-05-15 DIAGNOSIS — I130 Hypertensive heart and chronic kidney disease with heart failure and stage 1 through stage 4 chronic kidney disease, or unspecified chronic kidney disease: Principal | ICD-10-CM | POA: Diagnosis present

## 2017-05-15 DIAGNOSIS — I4891 Unspecified atrial fibrillation: Secondary | ICD-10-CM | POA: Diagnosis present

## 2017-05-15 DIAGNOSIS — N3020 Other chronic cystitis without hematuria: Secondary | ICD-10-CM | POA: Diagnosis present

## 2017-05-15 DIAGNOSIS — R059 Cough, unspecified: Secondary | ICD-10-CM

## 2017-05-15 DIAGNOSIS — J449 Chronic obstructive pulmonary disease, unspecified: Secondary | ICD-10-CM | POA: Diagnosis present

## 2017-05-15 DIAGNOSIS — E8770 Fluid overload, unspecified: Secondary | ICD-10-CM | POA: Diagnosis present

## 2017-05-15 DIAGNOSIS — I5043 Acute on chronic combined systolic (congestive) and diastolic (congestive) heart failure: Secondary | ICD-10-CM | POA: Diagnosis present

## 2017-05-15 DIAGNOSIS — Z7901 Long term (current) use of anticoagulants: Secondary | ICD-10-CM

## 2017-05-15 DIAGNOSIS — R001 Bradycardia, unspecified: Secondary | ICD-10-CM | POA: Diagnosis not present

## 2017-05-15 DIAGNOSIS — I472 Ventricular tachycardia: Secondary | ICD-10-CM | POA: Diagnosis not present

## 2017-05-15 DIAGNOSIS — I482 Chronic atrial fibrillation: Secondary | ICD-10-CM | POA: Diagnosis present

## 2017-05-15 DIAGNOSIS — J189 Pneumonia, unspecified organism: Secondary | ICD-10-CM | POA: Diagnosis present

## 2017-05-15 DIAGNOSIS — I350 Nonrheumatic aortic (valve) stenosis: Secondary | ICD-10-CM | POA: Diagnosis present

## 2017-05-15 DIAGNOSIS — K219 Gastro-esophageal reflux disease without esophagitis: Secondary | ICD-10-CM | POA: Diagnosis present

## 2017-05-15 DIAGNOSIS — R21 Rash and other nonspecific skin eruption: Secondary | ICD-10-CM | POA: Diagnosis not present

## 2017-05-15 DIAGNOSIS — J9611 Chronic respiratory failure with hypoxia: Secondary | ICD-10-CM | POA: Diagnosis present

## 2017-05-15 DIAGNOSIS — M549 Dorsalgia, unspecified: Secondary | ICD-10-CM | POA: Diagnosis present

## 2017-05-15 DIAGNOSIS — K7460 Unspecified cirrhosis of liver: Secondary | ICD-10-CM | POA: Diagnosis present

## 2017-05-15 DIAGNOSIS — Z79899 Other long term (current) drug therapy: Secondary | ICD-10-CM

## 2017-05-15 DIAGNOSIS — G4733 Obstructive sleep apnea (adult) (pediatric): Secondary | ICD-10-CM | POA: Diagnosis present

## 2017-05-15 DIAGNOSIS — I272 Pulmonary hypertension, unspecified: Secondary | ICD-10-CM | POA: Diagnosis present

## 2017-05-15 DIAGNOSIS — N400 Enlarged prostate without lower urinary tract symptoms: Secondary | ICD-10-CM | POA: Diagnosis present

## 2017-05-15 DIAGNOSIS — I5031 Acute diastolic (congestive) heart failure: Secondary | ICD-10-CM | POA: Diagnosis not present

## 2017-05-15 DIAGNOSIS — R791 Abnormal coagulation profile: Secondary | ICD-10-CM | POA: Diagnosis present

## 2017-05-15 DIAGNOSIS — R338 Other retention of urine: Secondary | ICD-10-CM | POA: Diagnosis present

## 2017-05-15 DIAGNOSIS — J9621 Acute and chronic respiratory failure with hypoxia: Secondary | ICD-10-CM | POA: Diagnosis present

## 2017-05-15 DIAGNOSIS — J90 Pleural effusion, not elsewhere classified: Secondary | ICD-10-CM | POA: Diagnosis present

## 2017-05-15 DIAGNOSIS — I2510 Atherosclerotic heart disease of native coronary artery without angina pectoris: Secondary | ICD-10-CM | POA: Diagnosis present

## 2017-05-15 DIAGNOSIS — F17200 Nicotine dependence, unspecified, uncomplicated: Secondary | ICD-10-CM | POA: Diagnosis present

## 2017-05-15 DIAGNOSIS — E785 Hyperlipidemia, unspecified: Secondary | ICD-10-CM | POA: Diagnosis present

## 2017-05-15 DIAGNOSIS — K5900 Constipation, unspecified: Secondary | ICD-10-CM | POA: Diagnosis present

## 2017-05-15 DIAGNOSIS — N182 Chronic kidney disease, stage 2 (mild): Secondary | ICD-10-CM | POA: Diagnosis present

## 2017-05-15 DIAGNOSIS — F17210 Nicotine dependence, cigarettes, uncomplicated: Secondary | ICD-10-CM | POA: Diagnosis present

## 2017-05-15 DIAGNOSIS — Z950 Presence of cardiac pacemaker: Secondary | ICD-10-CM

## 2017-05-15 DIAGNOSIS — R05 Cough: Secondary | ICD-10-CM

## 2017-05-15 DIAGNOSIS — I10 Essential (primary) hypertension: Secondary | ICD-10-CM | POA: Diagnosis present

## 2017-05-15 DIAGNOSIS — G8929 Other chronic pain: Secondary | ICD-10-CM | POA: Diagnosis present

## 2017-05-15 DIAGNOSIS — N179 Acute kidney failure, unspecified: Secondary | ICD-10-CM | POA: Diagnosis present

## 2017-05-15 DIAGNOSIS — I700 Atherosclerosis of aorta: Secondary | ICD-10-CM | POA: Diagnosis present

## 2017-05-15 LAB — CBC WITH DIFFERENTIAL/PLATELET
BASOS PCT: 0 %
Basophils Absolute: 0 10*3/uL (ref 0.0–0.1)
Eosinophils Absolute: 0 10*3/uL (ref 0.0–0.7)
Eosinophils Relative: 0 %
HEMATOCRIT: 41.7 % (ref 39.0–52.0)
HEMOGLOBIN: 12.4 g/dL — AB (ref 13.0–17.0)
LYMPHS ABS: 1.3 10*3/uL (ref 0.7–4.0)
Lymphocytes Relative: 19 %
MCH: 28 pg (ref 26.0–34.0)
MCHC: 29.7 g/dL — AB (ref 30.0–36.0)
MCV: 94.1 fL (ref 78.0–100.0)
MONOS PCT: 7 %
Monocytes Absolute: 0.5 10*3/uL (ref 0.1–1.0)
NEUTROS ABS: 5 10*3/uL (ref 1.7–7.7)
NEUTROS PCT: 74 %
Platelets: 136 10*3/uL — ABNORMAL LOW (ref 150–400)
RBC: 4.43 MIL/uL (ref 4.22–5.81)
RDW: 15.1 % (ref 11.5–15.5)
WBC: 6.9 10*3/uL (ref 4.0–10.5)

## 2017-05-15 LAB — BRAIN NATRIURETIC PEPTIDE: B NATRIURETIC PEPTIDE 5: 246.3 pg/mL — AB (ref 0.0–100.0)

## 2017-05-15 LAB — COMPREHENSIVE METABOLIC PANEL
ALT: 15 U/L — ABNORMAL LOW (ref 17–63)
ANION GAP: 9 (ref 5–15)
AST: 26 U/L (ref 15–41)
Albumin: 3.5 g/dL (ref 3.5–5.0)
Alkaline Phosphatase: 58 U/L (ref 38–126)
BUN: 35 mg/dL — ABNORMAL HIGH (ref 6–20)
CALCIUM: 8.5 mg/dL — AB (ref 8.9–10.3)
CHLORIDE: 84 mmol/L — AB (ref 101–111)
CO2: 40 mmol/L — ABNORMAL HIGH (ref 22–32)
Creatinine, Ser: 1.2 mg/dL (ref 0.61–1.24)
GFR calc non Af Amer: 56 mL/min — ABNORMAL LOW (ref >60)
Glucose, Bld: 100 mg/dL — ABNORMAL HIGH (ref 65–99)
Potassium: 4.7 mmol/L (ref 3.5–5.1)
Sodium: 133 mmol/L — ABNORMAL LOW (ref 135–145)
Total Bilirubin: 1.3 mg/dL — ABNORMAL HIGH (ref 0.3–1.2)
Total Protein: 6.3 g/dL — ABNORMAL LOW (ref 6.5–8.1)

## 2017-05-15 LAB — I-STAT TROPONIN, ED: Troponin i, poc: 0.02 ng/mL (ref 0.00–0.08)

## 2017-05-15 LAB — PROTIME-INR
INR: 1.65
Prothrombin Time: 19.4 seconds — ABNORMAL HIGH (ref 11.4–15.2)

## 2017-05-15 NOTE — ED Triage Notes (Signed)
Patient reports only small amount of urine output from suprapubic catheter since yesterday. C/o pain 10/10.

## 2017-05-15 NOTE — ED Provider Notes (Signed)
[facility]-EMERGENCY DEPT Provider Note   CSN: [id number] Arrival date & time: 05/15/17  2048     History   Chief [id number] Chief Complaint  Patient presents with  . Urinary Retention    HPI Howard CombesWalter C Careaga Jr. is a 82 y.o. male.  The history is provided by the patient. No language interpreter was used.   Howard CombesWalter C Teti Jr. is a 82 y.o. male who presents to the Emergency Department complaining of difficulty urinating. Urgency department accompanied by his son for evaluation of difficulty urinating. He's had decreased output from his suprapubic catheter for the last 48 hours. He went to the urology office this afternoon and it was exchanged with difficulty. He has had about 50 mL out since the catheter exchange. Over the last 4 to 5 days he has had pain in his groin and lower abdomen. Pain is waxing and waning and worsening every day. He also reports progressive bilateral lower extremity as well as groin edema for the last several months. He has chronic shortness of breath and cough and this is unchanged from his baseline. He denies any fevers, chest pain, vomiting. He does endorse associated constipation. Symptoms are moderate, constant, worsening. Past Medical History:  Diagnosis Date  . Chest pain syndrome   . CHF (congestive heart failure) (HCC)   . COPD (chronic obstructive pulmonary disease) (HCC)   . Dyslipidemia   . GERD (gastroesophageal reflux disease)   . OSA (obstructive sleep apnea)   . Pacemaker   . Permanent atrial fibrillation (HCC)   . Pulmonary hypertension [facility](HCC)     Patient Active Problem List   Diagnosis Date Noted  . CAP (community acquired pneumonia) 05/16/2017  . Chronic respiratory failure with hypoxia (HCC) 05/16/2017  . Pleural effusion 05/16/2017  . Fluid overload 05/16/2017  . CHF (congestive heart failure), NYHA class II, acute, diastolic (HCC) 05/16/2017  . Chronic cystitis 05/16/2017  . Pulmonary hypertension (HCC)  02/11/2017  . Edema 02/11/2017  . Acute on chronic combined systolic and diastolic CHF (congestive heart failure) (HCC) 02/08/2017  . Angina pectoris (HCC) 11/25/2016  . PAH (pulmonary artery hypertension) (HCC) 08/26/2016  . Aortic atherosclerosis (HCC) 08/26/2016  . COPD GOLD 0 11/11/2014  . Pacemaker 12/23/2012  . Long term (current) use of anticoagulants 05/04/2012  . HYPERTENSION 06/25/2008  . OSA (obstructive sleep apnea) 06/25/2008  . Atrial fibrillation with slow ventricular response (HCC) 06/12/2008  . Hypercholesterolemia 06/10/2008  . MORBID OBESITY 06/10/2008  . CAD (coronary artery disease) 06/10/2008  . Chronic pulmonary heart disease (HCC) 06/10/2008  . RENAL FAILURE, CHRONIC 06/10/2008  . BENIGN PROSTATIC HYPERTROPHY, HX OF 06/10/2008  . OBESITY, NOS 04/13/2006  . Major depressive disorder, recurrent episode (HCC) 04/13/2006  . ANXIETY 04/13/2006  . Tobacco use disorder 04/13/2006  . MIGRAINE, UNSPEC., W/O INTRACTABLE MIGRAINE 04/13/2006  . HYPERTENSION, BENIGN SYSTEMIC 04/13/2006  . Chronic airway obstruction, not elsewhere classified 04/13/2006  . PEPTIC ULCER DIS., UNSPEC. W/O OBSTRUCTION 04/13/2006  . Hyperplasia of prostate 04/13/2006  . SEBORRHEIC DERMATITIS, NOS 04/13/2006  . OSTEOARTHRITIS, MULTI SITES 04/13/2006    Past Surgical History:  Procedure Laterality Date  . HERNIA REPAIR    . NM MYOCAR PERF WALL MOTION  11/25/2008   normal  . PERMANENT PACEMAKER INSERTION  11/16/2009   medtronic  . PROSTATE SURGERY          Home Medications    Prior to Admission medications   Medication Sig Start Date End Date Taking? Authorizing Provider  albuterol (PROVENTIL  HFA;VENTOLIN HFA) 108 (90 BASE) MCG/ACT inhaler Inhale 2 puffs into the lungs every 6 (six) hours as needed for wheezing or shortness of breath.   Yes [provider]  ALPRAZolam Prudy Feeler) 1 MG tablet Take 0.5-1 mg by mouth daily as needed for anxiety.   Yes [provider]    cyanocobalamin (,VITAMIN B-12,) 1000 MCG/ML injection Inject 1,000 mcg into the muscle every 30 (thirty) days.   Yes [provider]  furosemide (LASIX) 40 MG tablet TAKE 1 TABLET TWICE A DAY TO MAKE HEART WORK BETTER 04/18/17  Yes [provider]  furosemide (LASIX) 40 MG tablet Take 40 mg by mouth 2 (two) times daily.   Yes [provider]  omeprazole (PRILOSEC) 20 MG capsule Take 20 mg by mouth daily.   Yes [provider]  oxyCODONE-acetaminophen (PERCOCET) 10-325 MG per tablet Take 1 tablet by mouth every 6 (six) hours as needed for pain.    Yes [provider]  potassium chloride SA (K-DUR,KLOR-CON) 20 MEQ tablet Take 1 tablet (20 mEq total) by mouth 2 (two) times daily. Patient taking differently: Take 20 mEq by mouth daily.  02/11/17  Yes Pearson Grippe, MD  warfarin (COUMADIN) 5 MG tablet TAKE 1 TO 1 AND 1/2 TABLETS BY MOUTH DAILY AS DIRECTED BY COUMADIN CLINIC Patient taking differently: TAKE 1.5 TABLETS (7.5 MG) BY MOUTH ON MWF AND TAKE 1 TABLET (5 MG) ALL OTHER DAYS 12/05/16  Yes Croitoru, Mihai, MD  furosemide (LASIX) 20 MG tablet Take 3 tablets (60 mg total) by mouth 2 (two) times daily. Patient not taking: Reported on 05/15/2017 02/11/17   Pearson Grippe, MD  isosorbide mononitrate (IMDUR) 30 MG 24 hr tablet Take 1 tablet (30 mg total) by mouth daily. 08/24/16 11/22/16  Croitoru, Mihai, MD  losartan (COZAAR) 25 MG tablet Take 0.5 tablets (12.5 mg total) by mouth daily. Patient not taking: Reported on 05/15/2017 02/11/17   Pearson Grippe, MD  nitroGLYCERIN (NITROSTAT) 0.4 MG SL tablet Place 1 tablet (0.4 mg total) under the tongue every 5 (five) minutes as needed for chest pain. 08/24/16   Croitoru, Mihai, MD  sodium chloride (OCEAN) 0.65 % nasal spray Place 1 spray into the nose as needed for congestion.    [provider]  Vitamin D, Ergocalciferol, (DRISDOL) 50000 UNITS CAPS Take 50,000 Units by mouth every 7 (seven) days. Wednesdays.    [provider]    Family History Family History  Problem Relation Age of Onset  . Colon cancer Mother   . CVA Mother   . Heart attack Mother   . Heart failure Father   . Lung disease Father   . Lung cancer Brother   . Heart attack Brother     Social History Social History   Tobacco Use  . Smoking status: Current Every Day Smoker    Packs/day: 1.00    Years: 65.00    Pack years: 65.00    Types: Cigarettes  . Smokeless tobacco: Never Used  Substance Use Topics  . Alcohol use: No    Alcohol/week: 0.0 oz  . Drug use: No     Allergies   Patient has no known allergies.   Review of Systems Review of Systems  All other systems reviewed and are negative.    Physical Exam Updated Vital Signs BP 123/61   Pulse 62   Resp 17   SpO2 91%   Physical Exam  Constitutional: He is oriented to person, place, and time. He appears well-developed and  well-nourished.  HENT:  Head: Normocephalic and atraumatic.  Cardiovascular: Normal rate and regular rhythm.  No murmur heard. Pulmonary/Chest: Effort normal. No respiratory distress.  Decreased air movement bilaterally  Abdominal: Soft. There is no rebound and no guarding.  Suprapubic catheter in the left lower quadrant. 18 French catheter in place. Mild diffuse abdominal tenderness. No hernia.  Genitourinary:  Genitourinary Comments: Mild to moderate edema of the scrotum and penis. No erythema.  Musculoskeletal: He exhibits no tenderness.  2+ edema to bilateral lower extremities.  Neurological: He is alert and oriented to person, place, and time.  Skin: Skin is warm and dry.  Psychiatric: He has a normal mood and affect. His behavior is normal.  Nursing note and vitals reviewed.    ED Treatments / Results  Labs (all labs ordered are listed, but only abnormal results are displayed) Labs Reviewed  COMPREHENSIVE METABOLIC PANEL - Abnormal; Notable for the following components:      Result Value   Sodium 133 (*)     Chloride 84 (*)    CO2 40 (*)    Glucose, Bld 100 (*)    BUN 35 (*)    Calcium 8.5 (*)    Total Protein 6.3 (*)    ALT 15 (*)    Total Bilirubin 1.3 (*)    GFR calc non Af Amer 56 (*)    All other components within normal limits  CBC WITH DIFFERENTIAL/PLATELET - Abnormal; Notable for the following components:   Hemoglobin 12.4 (*)    MCHC 29.7 (*)    Platelets 136 (*)    All other components within normal limits  PROTIME-INR - Abnormal; Notable for the following components:   Prothrombin Time 19.4 (*)    All other components within normal limits  BRAIN NATRIURETIC PEPTIDE - Abnormal; Notable for the following components:   B Natriuretic Peptide 246.3 (*)    All other components within normal limits  URINALYSIS, ROUTINE W REFLEX MICROSCOPIC - Abnormal; Notable for the following components:   Color, Urine AMBER (*)    APPearance CLOUDY (*)    Hgb urine dipstick LARGE (*)    Protein, ur >=300 (*)    Nitrite POSITIVE (*)    Leukocytes, UA MODERATE (*)    Bacteria, UA MANY (*)    Squamous Epithelial / LPF 0-5 (*)    All other components within normal limits  URINE CULTURE  TROPONIN I  TROPONIN I  TROPONIN I  PROTIME-INR  I-STAT TROPONIN, ED    EKG None  Radiology Ct Abdomen Pelvis Wo Contrast  Result Date: 05/15/2017 CLINICAL DATA:  82 year old male with flank pain. Concern for kidney stone. EXAM: CT ABDOMEN AND PELVIS WITHOUT CONTRAST TECHNIQUE: Multidetector CT imaging of the abdomen and pelvis was performed following the standard protocol without IV contrast. COMPARISON:  Abdominal CT dated 02/08/2017 FINDINGS: Evaluation of this exam is limited in the absence of intravenous contrast. Lower chest: Partially visualized small right pleural effusion with associated partial compressive atelectasis of the right lower lobe. Superimposed pneumonia is not excluded. Clinical correlation is recommended. A 12 x 7 x 7 cm ovoid fluid attenuating structure at the right costophrenic angle  likely represents a loculated component of the pleural effusion. Left lung base linear atelectasis/scarring. There is mild cardiomegaly. Multi vessel coronary vascular calcification and cardiac pacemaker wires noted. There is no intra-abdominal free air.  Small ascites. Hepatobiliary: There is morphologic changes of cirrhosis. There are multiple stones in the gallbladder. Pancreas: Unremarkable. No pancreatic ductal dilatation or  surrounding inflammatory changes. Spleen: Normal in size without focal abnormality. Adrenals/Urinary Tract: The adrenal glands are unremarkable. Small right renal exophytic hypodense lesions are not characterized on this unenhanced CT. Ultrasound may provide better characterization. There is no hydronephrosis or nephrolithiasis on either side. The visualized ureters are grossly unremarkable. The urinary bladder is decompressed around a suprapubic catheter. There is diffuse thickening of the bladder wall which may be related to chronic infection. Correlation with urinalysis recommended. Stomach/Bowel: There is extensive colonic diverticulosis without active inflammatory changes. There is no bowel obstruction. Thickened appearance of the stomach likely related to underdistention. Clinical correlation is recommended. The appendix is not visualized with certainty. No inflammatory changes identified in the right lower quadrant. Vascular/Lymphatic: There is moderate aortoiliac atherosclerotic disease. No portal venous gas. There is no adenopathy. Reproductive: The prostate and seminal vesicles are grossly unremarkable. Other: There is midline vertical anterior pelvic wall incisional scar. Diffuse subcutaneous edema. Musculoskeletal: There is osteopenia with multilevel degenerative changes of the spine. Grade 1 L4-L5 anterolisthesis. Multilevel disc desiccation with vacuum phenomena. No acute osseous pathology. IMPRESSION: 1. Cirrhosis and small ascites. 2. Extensive colonic diverticulosis. No  bowel obstruction or active inflammation. 3. No hydronephrosis or nephrolithiasis. Thickened urinary bladder likely related to chronic infection. Correlation with urinalysis recommended. 4. Cholelithiasis. 5. Small right pleural effusion, increased since the prior CT. Electronically Signed   By: Elgie Collard M.D.   On: 05/15/2017 22:48   Dg Chest 2 View  Result Date: 05/15/2017 CLINICAL DATA:  82 year old male with shortness of breath. EXAM: CHEST - 2 VIEW COMPARISON:  Chest radiograph dated 02/08/2017 FINDINGS: Right lung base opacity has increased since the prior radiograph and most likely represent combination of pleural effusion and associated atelectasis or infiltrate. Underlying mass is not excluded. Clinical correlation and follow-up to resolution recommended. Left lung base linear atelectasis/scarring. There is no pneumothorax. There is mild cardiomegaly. There is atherosclerotic calcification of the aortic arch. Left pectoral pacemaker device. No acute osseous pathology. IMPRESSION: Right lung base opacity likely combination of pleural effusion and associated atelectasis/infiltrate. Clinical correlation and follow-up to resolution recommended to exclude underlying mass. Electronically Signed   By: Elgie Collard M.D.   On: 05/15/2017 22:36    Procedures Procedures (including critical care time)  Medications Ordered in ED Medications  cefTRIAXone (ROCEPHIN) 1 g in sodium chloride 0.9 % 100 mL IVPB (has no administration in time range)  azithromycin (ZITHROMAX) tablet 500 mg (has no administration in time range)  furosemide (LASIX) injection 40 mg (40 mg Intravenous Given 05/16/17 0033)  cefTRIAXone (ROCEPHIN) 1 g in sodium chloride 0.9 % 100 mL IVPB (0 g Intravenous Stopped 05/16/17 0103)  azithromycin (ZITHROMAX) tablet 500 mg (500 mg Oral Given 05/16/17 0033)  oxyCODONE-acetaminophen (PERCOCET/ROXICET) 5-325 MG per tablet 1-2 tablet (1 tablet Oral Given 05/16/17 0124)     Initial  Impression / Assessment and Plan / ED Course  I have reviewed the triage vital signs and the nursing notes.  Pertinent labs & imaging results that were available during my care of the patient were reviewed by me and considered in my medical decision making (see chart for details).     Patient here for evaluation of decreased urinary output, lower extremity swelling as well as lower abdominal pain. He is non-toxic appearing on examination. He was hypoxic on ED arrival and becomes easily hypoxic with minimal activity. Chest x-ray and CT scan are concerning for infiltrate as well as right-sided pleural effusion. Exam is concerning for considerable lower extremity edema.  CT with no evidence of urinary obstruction. Concern for element of CHF as well as pneumonia contributing to his edema and desaturation episodes. Plan to admit for IV antibiotics and diuresis.  Final Clinical Impressions(s) / ED Diagnoses   Final diagnoses:  None    ED Discharge Orders    None       Tilden Fossa, MD 05/16/17 0151

## 2017-05-16 ENCOUNTER — Other Ambulatory Visit: Payer: Self-pay

## 2017-05-16 ENCOUNTER — Inpatient Hospital Stay (HOSPITAL_COMMUNITY): Payer: Medicare Other

## 2017-05-16 DIAGNOSIS — J181 Lobar pneumonia, unspecified organism: Secondary | ICD-10-CM | POA: Diagnosis not present

## 2017-05-16 DIAGNOSIS — F17200 Nicotine dependence, unspecified, uncomplicated: Secondary | ICD-10-CM

## 2017-05-16 DIAGNOSIS — I4891 Unspecified atrial fibrillation: Secondary | ICD-10-CM | POA: Diagnosis not present

## 2017-05-16 DIAGNOSIS — N3020 Other chronic cystitis without hematuria: Secondary | ICD-10-CM | POA: Diagnosis present

## 2017-05-16 DIAGNOSIS — R627 Adult failure to thrive: Secondary | ICD-10-CM | POA: Diagnosis present

## 2017-05-16 DIAGNOSIS — N400 Enlarged prostate without lower urinary tract symptoms: Secondary | ICD-10-CM

## 2017-05-16 DIAGNOSIS — J90 Pleural effusion, not elsewhere classified: Secondary | ICD-10-CM | POA: Diagnosis present

## 2017-05-16 DIAGNOSIS — I25118 Atherosclerotic heart disease of native coronary artery with other forms of angina pectoris: Secondary | ICD-10-CM

## 2017-05-16 DIAGNOSIS — E8779 Other fluid overload: Secondary | ICD-10-CM | POA: Diagnosis not present

## 2017-05-16 DIAGNOSIS — I5043 Acute on chronic combined systolic (congestive) and diastolic (congestive) heart failure: Secondary | ICD-10-CM

## 2017-05-16 DIAGNOSIS — I5031 Acute diastolic (congestive) heart failure: Secondary | ICD-10-CM

## 2017-05-16 DIAGNOSIS — J189 Pneumonia, unspecified organism: Secondary | ICD-10-CM | POA: Diagnosis present

## 2017-05-16 DIAGNOSIS — M549 Dorsalgia, unspecified: Secondary | ICD-10-CM | POA: Diagnosis present

## 2017-05-16 DIAGNOSIS — N182 Chronic kidney disease, stage 2 (mild): Secondary | ICD-10-CM | POA: Diagnosis present

## 2017-05-16 DIAGNOSIS — G4733 Obstructive sleep apnea (adult) (pediatric): Secondary | ICD-10-CM | POA: Diagnosis present

## 2017-05-16 DIAGNOSIS — R338 Other retention of urine: Secondary | ICD-10-CM | POA: Diagnosis present

## 2017-05-16 DIAGNOSIS — I482 Chronic atrial fibrillation: Secondary | ICD-10-CM | POA: Diagnosis present

## 2017-05-16 DIAGNOSIS — I350 Nonrheumatic aortic (valve) stenosis: Secondary | ICD-10-CM | POA: Diagnosis present

## 2017-05-16 DIAGNOSIS — J449 Chronic obstructive pulmonary disease, unspecified: Secondary | ICD-10-CM

## 2017-05-16 DIAGNOSIS — E7800 Pure hypercholesterolemia, unspecified: Secondary | ICD-10-CM | POA: Diagnosis not present

## 2017-05-16 DIAGNOSIS — Z9359 Other cystostomy status: Secondary | ICD-10-CM | POA: Diagnosis not present

## 2017-05-16 DIAGNOSIS — N401 Enlarged prostate with lower urinary tract symptoms: Secondary | ICD-10-CM | POA: Diagnosis present

## 2017-05-16 DIAGNOSIS — I361 Nonrheumatic tricuspid (valve) insufficiency: Secondary | ICD-10-CM

## 2017-05-16 DIAGNOSIS — R21 Rash and other nonspecific skin eruption: Secondary | ICD-10-CM | POA: Diagnosis not present

## 2017-05-16 DIAGNOSIS — I340 Nonrheumatic mitral (valve) insufficiency: Secondary | ICD-10-CM

## 2017-05-16 DIAGNOSIS — E785 Hyperlipidemia, unspecified: Secondary | ICD-10-CM | POA: Diagnosis present

## 2017-05-16 DIAGNOSIS — I10 Essential (primary) hypertension: Secondary | ICD-10-CM

## 2017-05-16 DIAGNOSIS — I472 Ventricular tachycardia: Secondary | ICD-10-CM | POA: Diagnosis not present

## 2017-05-16 DIAGNOSIS — I2781 Cor pulmonale (chronic): Secondary | ICD-10-CM | POA: Diagnosis not present

## 2017-05-16 DIAGNOSIS — I130 Hypertensive heart and chronic kidney disease with heart failure and stage 1 through stage 4 chronic kidney disease, or unspecified chronic kidney disease: Secondary | ICD-10-CM | POA: Diagnosis present

## 2017-05-16 DIAGNOSIS — K219 Gastro-esophageal reflux disease without esophagitis: Secondary | ICD-10-CM | POA: Diagnosis present

## 2017-05-16 DIAGNOSIS — I272 Pulmonary hypertension, unspecified: Secondary | ICD-10-CM

## 2017-05-16 DIAGNOSIS — J9611 Chronic respiratory failure with hypoxia: Secondary | ICD-10-CM

## 2017-05-16 DIAGNOSIS — E8770 Fluid overload, unspecified: Secondary | ICD-10-CM | POA: Diagnosis present

## 2017-05-16 DIAGNOSIS — K5900 Constipation, unspecified: Secondary | ICD-10-CM | POA: Diagnosis present

## 2017-05-16 DIAGNOSIS — I2721 Secondary pulmonary arterial hypertension: Secondary | ICD-10-CM | POA: Diagnosis not present

## 2017-05-16 DIAGNOSIS — I2510 Atherosclerotic heart disease of native coronary artery without angina pectoris: Secondary | ICD-10-CM | POA: Diagnosis present

## 2017-05-16 DIAGNOSIS — J9621 Acute and chronic respiratory failure with hypoxia: Secondary | ICD-10-CM | POA: Diagnosis present

## 2017-05-16 DIAGNOSIS — I5033 Acute on chronic diastolic (congestive) heart failure: Secondary | ICD-10-CM | POA: Diagnosis not present

## 2017-05-16 DIAGNOSIS — F17210 Nicotine dependence, cigarettes, uncomplicated: Secondary | ICD-10-CM | POA: Diagnosis present

## 2017-05-16 DIAGNOSIS — N179 Acute kidney failure, unspecified: Secondary | ICD-10-CM | POA: Diagnosis present

## 2017-05-16 DIAGNOSIS — R001 Bradycardia, unspecified: Secondary | ICD-10-CM | POA: Diagnosis not present

## 2017-05-16 DIAGNOSIS — K766 Portal hypertension: Secondary | ICD-10-CM | POA: Diagnosis not present

## 2017-05-16 DIAGNOSIS — R791 Abnormal coagulation profile: Secondary | ICD-10-CM | POA: Diagnosis present

## 2017-05-16 LAB — URINALYSIS, ROUTINE W REFLEX MICROSCOPIC
BILIRUBIN URINE: NEGATIVE
Glucose, UA: NEGATIVE mg/dL
Ketones, ur: NEGATIVE mg/dL
Nitrite: POSITIVE — AB
PH: 8 (ref 5.0–8.0)
Protein, ur: >=300 mg/dL — AB
SPECIFIC GRAVITY, URINE: 1.017 (ref 1.005–1.030)

## 2017-05-16 LAB — ECHOCARDIOGRAM COMPLETE
HEIGHTINCHES: 68 in
WEIGHTICAEL: 3125.24 [oz_av]

## 2017-05-16 LAB — TROPONIN I
TROPONIN I: 0.05 ng/mL — AB (ref <0.03)
TROPONIN I: 0.04 ng/mL — AB (ref <0.03)
Troponin I: 0.05 ng/mL (ref <0.03)

## 2017-05-16 LAB — PROTIME-INR
INR: 1.69
PROTHROMBIN TIME: 19.7 s — AB (ref 11.4–15.2)

## 2017-05-16 LAB — HIV ANTIBODY (ROUTINE TESTING W REFLEX): HIV Screen 4th Generation wRfx: NONREACTIVE

## 2017-05-16 LAB — STREP PNEUMONIAE URINARY ANTIGEN: Strep Pneumo Urinary Antigen: NEGATIVE

## 2017-05-16 MED ORDER — FUROSEMIDE 10 MG/ML IJ SOLN
60.0000 mg | Freq: Two times a day (BID) | INTRAMUSCULAR | Status: DC
  Administered 2017-05-16 – 2017-05-18 (×6): 60 mg via INTRAVENOUS
  Filled 2017-05-16 (×7): qty 6

## 2017-05-16 MED ORDER — SODIUM CHLORIDE 0.9% FLUSH: 3.0000 mL | INTRAVENOUS | Status: DC | PRN

## 2017-05-16 MED ORDER — SODIUM CHLORIDE 0.9 % IV SOLN
1.0000 g | INTRAVENOUS | Status: DC
  Administered 2017-05-16: 1 g via INTRAVENOUS
  Filled 2017-05-16: qty 1
  Filled 2017-05-16: qty 10

## 2017-05-16 MED ORDER — OXYCODONE-ACETAMINOPHEN 5-325 MG PO TABS
1.0000 | ORAL_TABLET | ORAL | Status: DC | PRN
Start: 2017-05-16 — End: 2017-05-20
  Administered 2017-05-16 – 2017-05-19 (×9): 1 via ORAL
  Filled 2017-05-16 (×10): qty 1

## 2017-05-16 MED ORDER — OXYCODONE-ACETAMINOPHEN 10-325 MG PO TABS: 1.0000 | ORAL_TABLET | ORAL | Status: DC | PRN

## 2017-05-16 MED ORDER — OXYCODONE-ACETAMINOPHEN 5-325 MG PO TABS
1.0000 | ORAL_TABLET | Freq: Once | ORAL | Status: AC
Start: 2017-05-16 — End: 2017-05-16
  Administered 2017-05-16: 1 via ORAL
  Filled 2017-05-16: qty 1

## 2017-05-16 MED ORDER — ALPRAZOLAM 0.5 MG PO TABS
0.5000 mg | ORAL_TABLET | Freq: Every day | ORAL | Status: DC | PRN
  Administered 2017-05-18 – 2017-05-20 (×2): 1 mg via ORAL
  Filled 2017-05-16 (×2): qty 2

## 2017-05-16 MED ORDER — IPRATROPIUM-ALBUTEROL 0.5-2.5 (3) MG/3ML IN SOLN
3.0000 mL | Freq: Three times a day (TID) | RESPIRATORY_TRACT | Status: DC
  Administered 2017-05-16 – 2017-05-17 (×4): 3 mL via RESPIRATORY_TRACT
  Filled 2017-05-16 (×4): qty 3

## 2017-05-16 MED ORDER — ACETAMINOPHEN 325 MG PO TABS: 650.0000 mg | ORAL_TABLET | ORAL | Status: DC | PRN

## 2017-05-16 MED ORDER — AZITHROMYCIN 250 MG PO TABS
500.0000 mg | ORAL_TABLET | Freq: Once | ORAL | Status: AC
  Administered 2017-05-16: 500 mg via ORAL
  Filled 2017-05-16: qty 2

## 2017-05-16 MED ORDER — OXYCODONE HCL 5 MG PO TABS
5.0000 mg | ORAL_TABLET | ORAL | Status: DC | PRN
Start: 2017-05-16 — End: 2017-05-20
  Administered 2017-05-16 – 2017-05-19 (×9): 5 mg via ORAL
  Filled 2017-05-16 (×10): qty 1

## 2017-05-16 MED ORDER — SODIUM CHLORIDE 0.9 % IV SOLN: 250.0000 mL | INTRAVENOUS | Status: DC | PRN

## 2017-05-16 MED ORDER — SODIUM CHLORIDE 0.9 % IV SOLN
1.0000 g | Freq: Once | INTRAVENOUS | Status: AC
  Administered 2017-05-16: 1 g via INTRAVENOUS
  Filled 2017-05-16: qty 10

## 2017-05-16 MED ORDER — AZITHROMYCIN 250 MG PO TABS
500.0000 mg | ORAL_TABLET | ORAL | Status: DC
  Administered 2017-05-16: 500 mg via ORAL
  Filled 2017-05-16: qty 2

## 2017-05-16 MED ORDER — FUROSEMIDE 10 MG/ML IJ SOLN
40.0000 mg | Freq: Once | INTRAMUSCULAR | Status: AC
  Administered 2017-05-16: 40 mg via INTRAVENOUS
  Filled 2017-05-16: qty 4

## 2017-05-16 MED ORDER — IPRATROPIUM-ALBUTEROL 0.5-2.5 (3) MG/3ML IN SOLN
3.0000 mL | Freq: Four times a day (QID) | RESPIRATORY_TRACT | Status: DC
  Administered 2017-05-16: 3 mL via RESPIRATORY_TRACT
  Filled 2017-05-16: qty 3

## 2017-05-16 MED ORDER — ALBUTEROL SULFATE (2.5 MG/3ML) 0.083% IN NEBU: 2.5000 mg | INHALATION_SOLUTION | RESPIRATORY_TRACT | Status: DC | PRN

## 2017-05-16 MED ORDER — ISOSORBIDE MONONITRATE ER 30 MG PO TB24
30.0000 mg | ORAL_TABLET | Freq: Every day | ORAL | Status: DC
  Administered 2017-05-16 – 2017-05-20 (×5): 30 mg via ORAL
  Filled 2017-05-16 (×5): qty 1

## 2017-05-16 MED ORDER — PANTOPRAZOLE SODIUM 40 MG PO TBEC
40.0000 mg | DELAYED_RELEASE_TABLET | Freq: Every day | ORAL | Status: DC
  Administered 2017-05-17 – 2017-05-20 (×4): 40 mg via ORAL
  Filled 2017-05-16 (×4): qty 1

## 2017-05-16 MED ORDER — ONDANSETRON HCL 4 MG/2ML IJ SOLN: 4.0000 mg | Freq: Four times a day (QID) | INTRAMUSCULAR | Status: DC | PRN

## 2017-05-16 MED ORDER — WARFARIN - PHARMACIST DOSING INPATIENT
Freq: Every day | Status: DC
Start: 2017-05-16 — End: 2017-05-20

## 2017-05-16 MED ORDER — SODIUM CHLORIDE 0.9% FLUSH
3.0000 mL | Freq: Two times a day (BID) | INTRAVENOUS | Status: DC
  Administered 2017-05-16 – 2017-05-20 (×9): 3 mL via INTRAVENOUS

## 2017-05-16 MED ORDER — WARFARIN SODIUM 5 MG PO TABS
7.5000 mg | ORAL_TABLET | Freq: Once | ORAL | Status: AC
  Administered 2017-05-16: 7.5 mg via ORAL
  Filled 2017-05-16: qty 1

## 2017-05-16 NOTE — Progress Notes (Signed)
CRITICAL VALUE ALERT  Critical Value: troponin 0.05  Date & Time Notied:  05/16/2017 @ 0235  Provider Notified:  MD ON CALL Schorr                               05/16/2017 @0237   Orders Received/Actions taken: repeat labs in 6hrs per orders

## 2017-05-16 NOTE — Progress Notes (Signed)
PT Cancellation Note  Patient Details Name: Howard CombesWalter C Howells Jr. MRN: [id number] DOB: 01/09/1936   Cancelled Treatment:    Reason Eval/Treat Not Completed: Patient declined, no reason specified(I just can't today); continue efforts to complete PT evaluation   [facility]mit SurgicalWILLIAMS,[name] 05/16/2017, 10:49 AM

## 2017-05-16 NOTE — Progress Notes (Signed)
ANTICOAGULATION CONSULT NOTE - Initial Consult  Pharmacy Consult for Warfarin Indication: atrial fibrillation  No Known Allergies       Vital Signs: BP: 113/59 (04/02 0100) Pulse Rate: 62 (04/02 0100)  Labs: Recent Labs    05/15/17 2249  HGB 12.4*  HCT 41.7  PLT 136*  LABPROT 19.4*  INR 1.65  CREATININE 1.20    CrCl cannot be calculated (Unknown ideal weight.).   Medical History: Past Medical History:  Diagnosis Date  . Chest pain syndrome   . CHF (congestive heart failure) (HCC)   . COPD (chronic obstructive pulmonary disease) (HCC)   . Dyslipidemia   . GERD (gastroesophageal reflux disease)   . OSA (obstructive sleep apnea)   . Pacemaker   . Permanent atrial fibrillation (HCC)   . Pulmonary hypertension (HCC)     Medications:  Scheduled:  . azithromycin  500 mg Oral Q24H   Infusions:  . cefTRIAXone (ROCEPHIN)  IV      Assessment: 81 yoM on chronic warfarin for A-fib. HD 7.5 mg M/W/F and 5 mg other days.  LD 4/1 1500  INR=1.65 on admission Goal of Therapy:  INR 2-3    Plan:  Daily PT/INR  Lorenza EvangelistGreen, Clover Feehan R 05/16/2017,1:35 AM

## 2017-05-16 NOTE — Progress Notes (Signed)
Pt offered cpap for hx of osa, but he declined.  Pt stated his home machine is broken and hasn't used it for awhile.  Pt stated that "there is no need" and just wants to wear nasal cannula instead.  Pt was encouraged to call should he change his mind.

## 2017-05-16 NOTE — Progress Notes (Signed)
  Echocardiogram 2D Echocardiogram has been performed.  Howard Torres L Androw 05/16/2017, 3:57 PM

## 2017-05-16 NOTE — H&P (Signed)
Howard Torres. ZOX:[id number] DOB: April 11, 1935 DOA: 05/15/2017     PCP: Aida Puffer, MD   Outpatient Specialists:   CARDS:  Dr. Sherlon Handing, [facility] :   Dr.  Cassell Smiles   Patient arrived to ER on 05/15/17 at 2048  Patient coming from:    home Lives   With family    Chief Complaint:  Chief Complaint  Patient presents with  . Urinary Retention    HPI: Howard Torres. is a 82 y.o. male with medical history significant of permanent atrial fibrillation, BPH, single chamber Medtronic Adapta permanent pacemaker, COPD on 2L at baseline, stasis dermatitis, HLD, GERD, OSA, pulmonary hypertension   Presented with   diminished output of suprapubic catheter since yesterday patient have had exchange done by urology with some difficulty and reports abdominal pain. Denies chest pain has cough productive of thick sputum creamy colored. Continues to smoke, reports occasional wheezing no fever. Denies alcohol abuse.  Reports pain in the groin and lower abdomen with progressive bilateral lower extremity and groin edema has been progressive the past few months.  Patient did endorse occasional shortness of breath and cough.  He noticed that his oxygen saturation at its home was down from his baseline.  Last admission for what appeared to be fluid overload possible acute on chronic CHF exacerbation was in December 2018 at that point he tolerated diuresis and improved he had similar presentation in the beginning.  Regarding pertinent Chronic problems: As per cardiology note last coronary artery evaluation was in 2010 and was normal patient has not followed up to have further evaluation Patient on Coumadin for history of atrial fibrillation.  Last echogram was in 2018 showing EF 50-55% moderately dilated left atrium.  PA peak pressure 55 mmHg.  While in ER: Was noted to be hypoxic when attempted to ambulate down to mid 80s  Significant initial  Findings: Abnormal Labs Reviewed    COMPREHENSIVE METABOLIC PANEL - Abnormal; Notable for the following components:      Result Value   Sodium 133 (*)    Chloride 84 (*)    CO2 40 (*)    Glucose, Bld 100 (*)    BUN 35 (*)    Calcium 8.5 (*)    Total Protein 6.3 (*)    ALT 15 (*)    Total Bilirubin 1.3 (*)    GFR calc non Af Amer 56 (*)    All other components within normal limits  CBC WITH DIFFERENTIAL/PLATELET - Abnormal; Notable for the following components:   Hemoglobin 12.4 (*)    MCHC 29.7 (*)    Platelets 136 (*)    All other components within normal limits  PROTIME-INR - Abnormal; Notable for the following components:   Prothrombin Time 19.4 (*)    All other components within normal limits  BRAIN NATRIURETIC PEPTIDE - Abnormal; Notable for the following components:   B Natriuretic Peptide 246.3 (*)    All other components within normal limits     Na 133 K 4.7  Cr    stable,   Lab Results  Component Value Date   CREATININE 1.20 05/15/2017   CREATININE 1.25 (H) 02/11/2017   CREATININE 1.18 02/10/2017   Albumin 3.5 Platelets 146 WBC  6.9  HG/HCT   stable,      Component Value Date/Time   HGB 12.4 (L) 05/15/2017 2249   HGB 13.3 08/16/2016 1152   HCT 41.7 05/15/2017 2249   HCT 41.9 08/16/2016 1152  Troponin (Point of Care Test) Recent Labs    05/15/17 2256  TROPIPOC 0.02    INR 1.65   BNP (last 3 results) Recent Labs    02/08/17 1152 05/15/17 2249  BNP 233.5* 246.3*    ProBNP (last 3 results) No results for input(s): PROBNP in the last 8760 hours.  Lactic Acid, Venous    Component Value Date/Time   LATICACIDVEN 1.0 05/26/2008 0508      UA  ordered   CXR -pleural effusion  CTabd/pelvis -12 x 7 x 7 likely loculated pleural effusion associated with compressive atelectasis versus right lower lobe pneumonia Diffuse thickening of bladder wall possible chronic infection. Possible cirrhosis with small ascites noted cholelithiasis.  ECG:  Ordered      ED Triage  Vitals  Enc Vitals Group     BP 05/15/17 2128 122/68     Pulse Rate 05/15/17 2128 66     Resp 05/15/17 2246 (!) 25     Temp --      Temp src --      SpO2 05/15/17 2128 (!) 84 %     Weight --      Height --      Head Circumference --      Peak Flow --      Pain Score 05/15/17 2132 10     Pain Loc --      Pain Edu? --      Excl. in GC? --   TMAX(24)@     on arrival  ED Triage Vitals  Enc Vitals Group     BP 05/15/17 2128 122/68     Pulse Rate 05/15/17 2128 66     Resp 05/15/17 2246 (!) 25     Temp --      Temp src --      SpO2 05/15/17 2128 (!) 84 %     Weight --      Height --      Head Circumference --      Peak Flow --      Pain Score 05/15/17 2132 10     Pain Loc --      Pain Edu? --      Excl. in GC? --      Latest  Blood pressure (!) 121/59, pulse 66, resp. rate (!) 23, SpO2 93 %.  Following Medications were ordered in ER: Medications  furosemide (LASIX) injection 40 mg (has no administration in time range)  cefTRIAXone (ROCEPHIN) 1 g in sodium chloride 0.9 % 100 mL IVPB (has no administration in time range)  azithromycin (ZITHROMAX) tablet 500 mg (has no administration in time range)      Hospitalist was called for admission for acute on chronic respiratory failure with hypoxia possibly secondary to pleural effusion and/or associated pneumonia if evidence of some fluid overload   Review of Systems:    Pertinent positives include:  fatigue, abdominal pain,Bilateral lower extremity swelling shortness of breath at rest, non-productive cough,  Constitutional:  No weight loss, night sweats, Fevers, chills, weight loss  HEENT:  No headaches, Difficulty swallowing,Tooth/dental problems,Sore throat,  No sneezing, itching, ear ache, nasal congestion, post nasal drip,  Cardio-vascular:  No chest pain, Orthopnea, PND, anasarca, dizziness, palpitations.no  GI:  No heartburn, indigestion, , nausea, vomiting, diarrhea, change in bowel habits, loss of appetite,  melena, blood in stool, hematemesis Resp:  no . No dyspnea on exertion, No excess mucus, no productive cough, No  No coughing up of blood.No change in color  of mucus.No wheezing. Skin:  no rash or lesions. No jaundice GU:  no dysuria, change in color of urine, no urgency or frequency. No straining to urinate.  No flank pain.  Musculoskeletal:  No joint pain or no joint swelling. No decreased range of motion. No back pain.  Psych:  No change in mood or affect. No depression or anxiety. No memory loss.  Neuro: no localizing neurological complaints, no tingling, no weakness, no double vision, no gait abnormality, no slurred speech, no confusion  As per HPI otherwise 10 point review of systems negative.   Past Medical History:   Past Medical History:  Diagnosis Date  . Chest pain syndrome   . CHF (congestive heart failure) (HCC)   . COPD (chronic obstructive pulmonary disease) (HCC)   . Dyslipidemia   . GERD (gastroesophageal reflux disease)   . OSA (obstructive sleep apnea)   . Pacemaker   . Permanent atrial fibrillation (HCC)   . Pulmonary hypertension (HCC)       Past Surgical History:  Procedure Laterality Date  . HERNIA REPAIR    . NM MYOCAR PERF WALL MOTION  11/25/2008   normal  . PERMANENT PACEMAKER INSERTION  11/16/2009   medtronic  . PROSTATE SURGERY      Social History:  Ambulatory   walker       reports that he has been smoking cigarettes.  He has a 65.00 pack-year smoking history. He has never used smokeless tobacco. He reports that he does not drink alcohol or use drugs.     Family History:   Family History  Problem Relation Age of Onset  . Colon cancer Mother   . CVA Mother   . Heart attack Mother   . Heart failure Father   . Lung disease Father   . Lung cancer Brother   . Heart attack Brother     Allergies: No Known Allergies   Prior to Admission medications   Medication Sig Start Date End Date Taking? Authorizing Provider  albuterol  (PROVENTIL HFA;VENTOLIN HFA) 108 (90 BASE) MCG/ACT inhaler Inhale 2 puffs into the lungs every 6 (six) hours as needed for wheezing or shortness of breath.   Yes [provider]  ALPRAZolam Prudy Feeler) 1 MG tablet Take 0.5-1 mg by mouth daily as needed for anxiety.   Yes [provider]  cyanocobalamin (,VITAMIN B-12,) 1000 MCG/ML injection Inject 1,000 mcg into the muscle every 30 (thirty) days.   Yes [provider]  furosemide (LASIX) 40 MG tablet TAKE 1 TABLET TWICE A DAY TO MAKE HEART WORK BETTER 04/18/17  Yes [provider]  furosemide (LASIX) 40 MG tablet Take 40 mg by mouth 2 (two) times daily.   Yes [provider]  omeprazole (PRILOSEC) 20 MG capsule Take 20 mg by mouth daily.   Yes [provider]  oxyCODONE-acetaminophen (PERCOCET) 10-325 MG per tablet Take 1 tablet by mouth every 6 (six) hours as needed for pain.    Yes [provider]  potassium chloride SA (K-DUR,KLOR-CON) 20 MEQ tablet Take 1 tablet (20 mEq total) by mouth 2 (two) times daily. Patient taking differently: Take 20 mEq by mouth daily.  02/11/17  Yes Pearson Grippe, MD  warfarin (COUMADIN) 5 MG tablet TAKE 1 TO 1 AND 1/2 TABLETS BY MOUTH DAILY AS DIRECTED BY COUMADIN CLINIC Patient taking differently: TAKE 1.5 TABLETS (7.5 MG) BY MOUTH ON MWF AND TAKE 1 TABLET (5 MG) ALL OTHER DAYS 12/05/16  Yes [facility], Rachelle Hora, MD  furosemide (LASIX)  20 MG tablet Take 3 tablets (60 mg total) by mouth 2 (two) times daily. Patient not taking: Reported on 05/15/2017 02/11/17   Pearson Grippe, MD  isosorbide mononitrate (IMDUR) 30 MG 24 hr tablet Take 1 tablet (30 mg total) by mouth daily. 08/24/16 11/22/16  [facility], Mihai, MD  losartan (COZAAR) 25 MG tablet Take 0.5 tablets (12.5 mg total) by mouth daily. Patient not taking: Reported on 05/15/2017 02/11/17   Pearson Grippe, MD  nitroGLYCERIN (NITROSTAT) 0.4 MG SL tablet Place 1 tablet (0.4 mg total) under the tongue every 5 (five) minutes as needed  for chest pain. 08/24/16   [facility], Mihai, MD  sodium chloride (OCEAN) 0.65 % nasal spray Place 1 spray into the nose as needed for congestion.    [provider]  Vitamin D, Ergocalciferol, (DRISDOL) 50000 UNITS CAPS Take 50,000 Units by mouth every 7 (seven) days. Wednesdays.    [provider]   Physical Exam: Blood pressure (!) 121/59, pulse 66, resp. rate (!) 23, SpO2 93 %. 1. General:  in No Acute distress   Chronically ill  -appearing 2. Psychological: Alert and  Oriented 3. Head/ENT:   Moist   Mucous Membranes                          Head Non traumatic, neck supple                            Poor Dentition 4. SKIN: normal  Skin turgor,  Skin clean Dry and intact no rash 5. Heart: Regular rate and rhythm systolic Murmur, no Rub or gallop 6. Lungs: no wheezes some crackles  deminished 7. Abdomen: Soft,  non-tender, Non distended  Obese bowel sounds present 8. Lower extremities: no clubbing, cyanosis,2+edema 9. Neurologically Grossly intact, moving all 4 extremities equally act 10. MSK: Normal range of motion   LABS:     Recent Labs  Lab 05/15/17 2249  WBC 6.9  NEUTROABS 5.0  HGB 12.4*  HCT 41.7  MCV 94.1  PLT 136*   Basic Metabolic Panel: Recent Labs  Lab 05/15/17 2249  NA 133*  K 4.7  CL 84*  CO2 40*  GLUCOSE 100*  BUN 35*  CREATININE 1.20  CALCIUM 8.5*      Recent Labs  Lab 05/15/17 2249  AST 26  ALT 15*  ALKPHOS 58  BILITOT 1.3*  PROT 6.3*  ALBUMIN 3.5   No results for input(s): LIPASE, AMYLASE in the last 168 hours. No results for input(s): AMMONIA in the last 168 hours.    HbA1C: No results for input(s): HGBA1C in the last 72 hours. CBG: No results for input(s): GLUCAP in the last 168 hours.    Urine analysis:    Component Value Date/Time   COLORURINE ORANGE (A) 12/20/2012 1629   APPEARANCEUR CLOUDY (A) 12/20/2012 1629   LABSPEC 1.024 12/20/2012 1629   PHURINE 8.0 12/20/2012 1629   GLUCOSEU NEG 12/20/2012 1629    HGBUR NEG 12/20/2012 1629   BILIRUBINUR SMALL (A) 12/20/2012 1629   KETONESUR NEG 12/20/2012 1629   PROTEINUR > 300 (A) 12/20/2012 1629   UROBILINOGEN 1 12/20/2012 1629   NITRITE NEG 12/20/2012 1629   LEUKOCYTESUR MOD (A) 12/20/2012 1629       Cultures:    Component Value Date/Time   SDES URINE, SUPRAPUBIC 04/10/2012 2147   SPECREQUEST NONE 04/10/2012 2147   CULT PROTEUS SPECIES 04/10/2012 2147   REPTSTATUS 04/14/2012  FINAL 04/10/2012 2147     Radiological Exams on Admission: Ct Abdomen Pelvis Wo Contrast  Result Date: 05/15/2017 CLINICAL DATA:  82 year old male with flank pain. Concern for kidney stone. EXAM: CT ABDOMEN AND PELVIS WITHOUT CONTRAST TECHNIQUE: Multidetector CT imaging of the abdomen and pelvis was performed following the standard protocol without IV contrast. COMPARISON:  Abdominal CT dated 02/08/2017 FINDINGS: Evaluation of this exam is limited in the absence of intravenous contrast. Lower chest: Partially visualized small right pleural effusion with associated partial compressive atelectasis of the right lower lobe. Superimposed pneumonia is not excluded. Clinical correlation is recommended. A 12 x 7 x 7 cm ovoid fluid attenuating structure at the right costophrenic angle likely represents a loculated component of the pleural effusion. Left lung base linear atelectasis/scarring. There is mild cardiomegaly. Multi vessel coronary vascular calcification and cardiac pacemaker wires noted. There is no intra-abdominal free air.  Small ascites. Hepatobiliary: There is morphologic changes of cirrhosis. There are multiple stones in the gallbladder. Pancreas: Unremarkable. No pancreatic ductal dilatation or surrounding inflammatory changes. Spleen: Normal in size without focal abnormality. Adrenals/Urinary Tract: The adrenal glands are unremarkable. Small right renal exophytic hypodense lesions are not characterized on this unenhanced CT. Ultrasound may provide better  characterization. There is no hydronephrosis or nephrolithiasis on either side. The visualized ureters are grossly unremarkable. The urinary bladder is decompressed around a suprapubic catheter. There is diffuse thickening of the bladder wall which may be related to chronic infection. Correlation with urinalysis recommended. Stomach/Bowel: There is extensive colonic diverticulosis without active inflammatory changes. There is no bowel obstruction. Thickened appearance of the stomach likely related to underdistention. Clinical correlation is recommended. The appendix is not visualized with certainty. No inflammatory changes identified in the right lower quadrant. Vascular/Lymphatic: There is moderate aortoiliac atherosclerotic disease. No portal venous gas. There is no adenopathy. Reproductive: The prostate and seminal vesicles are grossly unremarkable. Other: There is midline vertical anterior pelvic wall incisional scar. Diffuse subcutaneous edema. Musculoskeletal: There is osteopenia with multilevel degenerative changes of the spine. Grade 1 L4-L5 anterolisthesis. Multilevel disc desiccation with vacuum phenomena. No acute osseous pathology. IMPRESSION: 1. Cirrhosis and small ascites. 2. Extensive colonic diverticulosis. No bowel obstruction or active inflammation. 3. No hydronephrosis or nephrolithiasis. Thickened urinary bladder likely related to chronic infection. Correlation with urinalysis recommended. 4. Cholelithiasis. 5. Small right pleural effusion, increased since the prior CT. Electronically Signed   By: Elgie Collard M.D.   On: 05/15/2017 22:48   Dg Chest 2 View  Result Date: 05/15/2017 CLINICAL DATA:  82 year old male with shortness of breath. EXAM: CHEST - 2 VIEW COMPARISON:  Chest radiograph dated 02/08/2017 FINDINGS: Right lung base opacity has increased since the prior radiograph and most likely represent combination of pleural effusion and associated atelectasis or infiltrate. Underlying  mass is not excluded. Clinical correlation and follow-up to resolution recommended. Left lung base linear atelectasis/scarring. There is no pneumothorax. There is mild cardiomegaly. There is atherosclerotic calcification of the aortic arch. Left pectoral pacemaker device. No acute osseous pathology. IMPRESSION: Right lung base opacity likely combination of pleural effusion and associated atelectasis/infiltrate. Clinical correlation and follow-up to resolution recommended to exclude underlying mass. Electronically Signed   By: Elgie Collard M.D.   On: 05/15/2017 22:36    Chart has been reviewed    Assessment/Plan   82 y.o. male with medical history significant of permanent atrial fibrillation, BPH, single chamber Medtronic Adapta permanent pacemaker, COPD, stasis dermatitis, HLD, GERD, OSA, pulmonary hypertension Admitted for acute on chronic respiratory  failure with hypoxia possibly secondary to pleural effusion and/or associated pneumonia if evidence of some fluid overload   Present on Admission:   . CAP (community acquired pneumonia) unsure if true pneumonia versus atelectasis for now cover with antibiotics given thick mucus secretions await results of sputum culture . Tobacco use disorder spoke about importance of quitting nurse to provide tobacco cessation protocol . Pulmonary hypertension (HCC) -chronic could be possibly contributing to dyspnea.  Would benefit from outpatient pulmonology follow-up . HYPERTENSION, BENIGN SYSTEMIC stable continue home medications . Hypercholesterolemia stable continue home medications . COPD -currently no wheezing noted patient continues to smoke likely playing a role in acute on chronic respiratory failure order DuoNeb and albuterol as needed . CAD (coronary artery disease) stable continue home medications currently chest pain-free cycle cardiac enzymes . Atrial fibrillation with slow ventricular response (HCC) -           - CHA2DS2 vas score 4 :  continue current anticoagulation with  Coumadin per pharmacy,           -  Rate control:  Currently controlled       . Acute on chronic combined systolic and diastolic CHF (congestive heart failure) (HCC) -last echogram did show improved EF patient does appear to have fluid overload have responded in the past to Lasix no diuresis and repeat echogram appreciate cardiology input . Hyperplasia of prostate status post suprapubic catheter will continue   . Pleural effusion patient at this point not interested in thoracentesis this appears to be chronic and slowly increasing since December.  Would recommend outpatient pulmonology follow-up . Fluid overload will diuresis and continue to monitor urine output and kidney function  . Chronic cystitis will obtain urine culture in the past that had multiple organisms.  For now patient on Rocephin.  Unclear if abdominal pain is secondary to bladder inflammation.  Await results of urine culture most likely colonized    Other plan as per orders.  DVT prophylaxis:  coumadin   Code Status:  FULL CODE as per patient    Family Communication:   Family not at  Bedside    Disposition Plan:      To home once workup is complete and patient is stable                         Would benefit from PT/OT eval prior to DC  ordered                                                  Consults called: email cardiology  Admission status:    inpatient      Level of care     tele           I have spent a total of 56 min on this admission   Howard Torres 05/16/2017, 1:24 AM    [facility]  Pager [phone]   after 2 AM please page floor coverage PA If 7AM-7PM, please contact the day team taking care of the patient  [email or web address]  Password [email or web address]

## 2017-05-16 NOTE — Progress Notes (Signed)
ANTICOAGULATION CONSULT NOTE  Pharmacy Consult for Warfarin Indication: atrial fibrillation  No Known Allergies   Height: 5\' 8"  (172.7 cm) Weight: 195 lb 5.2 oz (88.6 kg) IBW/kg (Calculated) : 68.4   Vital Signs: Temp: 97.6 F (36.4 C) (04/02 0620) Temp Source: Oral (04/02 0620) BP: 107/58 (04/02 0620) Pulse Rate: 67 (04/02 0620)  Labs: Recent Labs    05/15/17 2249 05/16/17 0127 05/16/17 0445 05/16/17 0500  HGB 12.4*  --   --   --   HCT 41.7  --   --   --   PLT 136*  --   --   --   LABPROT 19.4*  --  19.7*  --   INR 1.65  --  1.69  --   CREATININE 1.20  --   --   --   TROPONINI  --  0.05*  --  0.05*    Estimated Creatinine Clearance: 54 mL/min (by C-G formula based on SCr of 1.2 mg/dL).  Assessment: 81 yoM on chronic warfarin for A-fib. HD 7.5 mg M/W/F and 5 mg other days.  LD 4/1 1500  INR=1.65 on admission 05/16/2017 INR 1.69 - below goal.  Hg 12.4, pltc low at 136.  No bleeding reported.   Goal of Therapy:  INR 2-3   Plan:  Coumadin 7.5 mg po x 1 dsoe Daily INR  Herby AbrahamMichelle T. Kyngston Pickelsimer, Pharm.D. [phone] 05/16/2017 7:30 AM

## 2017-05-16 NOTE — Progress Notes (Signed)
PROGRESS NOTE    Howard CombesWalter C Moccio Jr.  YNW:[id number] DOB: 06/23/1935 DOA: 05/15/2017 PCP: Aida PufferLittle, James, MD    Brief Narrative:  82 y.o. male with medical history significant of permanent atrial fibrillation, BPH, single chamber [employer] Adapta permanent pacemaker, COPD on 2L at baseline, stasis dermatitis, HLD, GERD, OSA, pulmonary hypertension. Patient presents with progressive sob and cough productive of thick sputum. Patient was last admitted for CHF exacerbation in 12/18. On presentation, patient noted to be hypoxic with O2 sats into the 80's.   Assessment & Plan:   Active Problems:   Hypercholesterolemia   Tobacco use disorder   HYPERTENSION, BENIGN SYSTEMIC   CAD (coronary artery disease)   Atrial fibrillation with slow ventricular response (HCC)   Hyperplasia of prostate   OSA (obstructive sleep apnea)   COPD GOLD 0   Acute on chronic combined systolic and diastolic CHF (congestive heart failure) (HCC)   Pulmonary hypertension (HCC)   CAP (community acquired pneumonia)   Chronic respiratory failure with hypoxia (HCC)   Pleural effusion   Fluid overload   CHF (congestive heart failure), NYHA class II, acute, diastolic (HCC)   Chronic cystitis   . Possible CAP (community acquired pneumonia)  - Presents with hypoxia with increased sputum production in the setting of continued tobacco abuse - Pt started on empiric azithromycin and rocephin. Will continue for now  . Tobacco use disorder - Cessation done at time of presentation - Declines nicotine patch  . Pulmonary hypertension (HCC)  -chronic could be possibly contributing to dyspnea.   - Would recommend outpatient pulmonology follow-up  . HYPERTENSION, BENIGN SYSTEMIC - Stable at present - continue home medications as tolerated  . Hypercholesterolemia  - stable at this time - Would continue home medications as tolerated  . COPD  - no wheezing on auscultation, lungs clear - Would continue on PRN  bronchodilators  . CAD (coronary artery disease)  - Denies chest pain - Follow cardiac enzymes  . Atrial fibrillation with slow ventricular response (HCC) -  - CHA2DS2 vas score 4 :  - continue current anticoagulation with Coumadin per pharmacy,  -Currently rate controlled  . Acute on chronic combined systolic and diastolic CHF (congestive heart failure) (HCC)  -last echogram did show improved EF  -Pt reports increased swelling prior to admit -Wt today 88.6kg (was 82.1kg at time of discharge in 12/18) -Continue IV lasix as tolerated -Follow daily wt and I/o  . Hyperplasia of prostate status post suprapubic catheter will continue   . Pleural effusion  -Possible related to acute CHF exacerbation per above -Continue IV lasix as tolerated -Wean O2 as tolerated  . Chronic cystitis  - already on abx as per above -  Urine culture pending  DVT prophylaxis: Coumadin Code Status: Full Family Communication: Pt in room, family at bedside Disposition Plan: Uncertain at this time  Consultants:     Procedures:     Antimicrobials: Anti-infectives (From admission, onward)   Start     Dose/Rate Route Frequency Ordered Stop   05/16/17 2359  cefTRIAXone (ROCEPHIN) 1 g in sodium chloride 0.9 % 100 mL IVPB     1 g 200 mL/hr over 30 Minutes Intravenous Every 24 hours 05/16/17 0056     05/16/17 2200  azithromycin (ZITHROMAX) tablet 500 mg     500 mg Oral Every 24 hours 05/16/17 0056     05/16/17 0030  cefTRIAXone (ROCEPHIN) 1 g in sodium chloride 0.9 % 100 mL IVPB     1 g 200 mL/hr  over 30 Minutes Intravenous  Once 05/16/17 0015 05/16/17 0103   05/16/17 0030  azithromycin (ZITHROMAX) tablet 500 mg     500 mg Oral  Once 05/16/17 0015 05/16/17 0033       Subjective: Without complaints. Denies chest pain  Objective: Vitals:   05/16/17 0202 05/16/17 0228 05/16/17 0620 05/16/17 0744  BP: 124/60  (!) 107/58   Pulse: 65  67   Resp: 18  18   Temp: 97.7 F (36.5 C)  97.6 F  (36.4 C)   TempSrc: Oral  Oral   SpO2: 95% 98% 98% 98%  Weight: 88.6 kg (195 lb 5.2 oz)     Height: 5\' 8"  (1.727 m)       Intake/Output Summary (Last 24 hours) at 05/16/2017 1227 Last data filed at 05/16/2017 1000 Gross per 24 hour  Intake 250 ml  Output 650 ml  Net -400 ml   Filed Weights   05/16/17 0202  Weight: 88.6 kg (195 lb 5.2 oz)    Examination:  General exam: Appears calm and comfortable  Respiratory system: Coarse breath sounds. Respiratory effort normal. Cardiovascular system: S1 & S2 heard, Regular Gastrointestinal system: Abdomen is nondistended, soft and nontender. No organomegaly or masses felt. Normal bowel sounds heard. Central nervous system: Alert and oriented. No focal neurological deficits. Extremities: Symmetric 5 x 5 power. Skin: No rashes, lesions  Psychiatry: Judgement and insight appear normal. Mood & affect appropriate.   Data Reviewed: I have personally reviewed following labs and imaging studies  CBC: Recent Labs  Lab 05/15/17 2249  WBC 6.9  NEUTROABS 5.0  HGB 12.4*  HCT 41.7  MCV 94.1  PLT 136*   Basic Metabolic Panel: Recent Labs  Lab 05/15/17 2249  NA 133*  K 4.7  CL 84*  CO2 40*  GLUCOSE 100*  BUN 35*  CREATININE 1.20  CALCIUM 8.5*   GFR: Estimated Creatinine Clearance: 54 mL/min (by C-G formula based on SCr of 1.2 mg/dL). Liver Function Tests: Recent Labs  Lab 05/15/17 2249  AST 26  ALT 15*  ALKPHOS 58  BILITOT 1.3*  PROT 6.3*  ALBUMIN 3.5   No results for input(s): LIPASE, AMYLASE in the last 168 hours. No results for input(s): AMMONIA in the last 168 hours. Coagulation Profile: Recent Labs  Lab 05/15/17 2249 05/16/17 0445  INR 1.65 1.69   Cardiac Enzymes: Recent Labs  Lab 05/16/17 0127 05/16/17 0500  TROPONINI 0.05* 0.05*   BNP (last 3 results) No results for input(s): PROBNP in the last 8760 hours. HbA1C: No results for input(s): HGBA1C in the last 72 hours. CBG: No results for input(s): GLUCAP  in the last 168 hours. Lipid Profile: No results for input(s): CHOL, HDL, LDLCALC, TRIG, CHOLHDL, LDLDIRECT in the last 72 hours. Thyroid Function Tests: No results for input(s): TSH, T4TOTAL, FREET4, T3FREE, THYROIDAB in the last 72 hours. Anemia Panel: No results for input(s): VITAMINB12, FOLATE, FERRITIN, TIBC, IRON, RETICCTPCT in the last 72 hours. Sepsis Labs: No results for input(s): PROCALCITON, LATICACIDVEN in the last 168 hours.  No results found for this or any previous visit (from the past 240 hour(s)).   Radiology Studies: Ct Abdomen Pelvis Wo Contrast  Result Date: 05/15/2017 CLINICAL DATA:  82 year old male with flank pain. Concern for kidney stone. EXAM: CT ABDOMEN AND PELVIS WITHOUT CONTRAST TECHNIQUE: Multidetector CT imaging of the abdomen and pelvis was performed following the standard protocol without IV contrast. COMPARISON:  Abdominal CT dated 02/08/2017 FINDINGS: Evaluation of this exam is limited in the  absence of intravenous contrast. Lower chest: Partially visualized small right pleural effusion with associated partial compressive atelectasis of the right lower lobe. Superimposed pneumonia is not excluded. Clinical correlation is recommended. A 12 x 7 x 7 cm ovoid fluid attenuating structure at the right costophrenic angle likely represents a loculated component of the pleural effusion. Left lung base linear atelectasis/scarring. There is mild cardiomegaly. Multi vessel coronary vascular calcification and cardiac pacemaker wires noted. There is no intra-abdominal free air.  Small ascites. Hepatobiliary: There is morphologic changes of cirrhosis. There are multiple stones in the gallbladder. Pancreas: Unremarkable. No pancreatic ductal dilatation or surrounding inflammatory changes. Spleen: Normal in size without focal abnormality. Adrenals/Urinary Tract: The adrenal glands are unremarkable. Small right renal exophytic hypodense lesions are not characterized on this unenhanced  CT. Ultrasound may provide better characterization. There is no hydronephrosis or nephrolithiasis on either side. The visualized ureters are grossly unremarkable. The urinary bladder is decompressed around a suprapubic catheter. There is diffuse thickening of the bladder wall which may be related to chronic infection. Correlation with urinalysis recommended. Stomach/Bowel: There is extensive colonic diverticulosis without active inflammatory changes. There is no bowel obstruction. Thickened appearance of the stomach likely related to underdistention. Clinical correlation is recommended. The appendix is not visualized with certainty. No inflammatory changes identified in the right lower quadrant. Vascular/Lymphatic: There is moderate aortoiliac atherosclerotic disease. No portal venous gas. There is no adenopathy. Reproductive: The prostate and seminal vesicles are grossly unremarkable. Other: There is midline vertical anterior pelvic wall incisional scar. Diffuse subcutaneous edema. Musculoskeletal: There is osteopenia with multilevel degenerative changes of the spine. Grade 1 L4-L5 anterolisthesis. Multilevel disc desiccation with vacuum phenomena. No acute osseous pathology. IMPRESSION: 1. Cirrhosis and small ascites. 2. Extensive colonic diverticulosis. No bowel obstruction or active inflammation. 3. No hydronephrosis or nephrolithiasis. Thickened urinary bladder likely related to chronic infection. Correlation with urinalysis recommended. 4. Cholelithiasis. 5. Small right pleural effusion, increased since the prior CT. Electronically Signed   By: Elgie Collard M.D.   On: 05/15/2017 22:48   Dg Chest 2 View  Result Date: 05/15/2017 CLINICAL DATA:  82 year old male with shortness of breath. EXAM: CHEST - 2 VIEW COMPARISON:  Chest radiograph dated 02/08/2017 FINDINGS: Right lung base opacity has increased since the prior radiograph and most likely represent combination of pleural effusion and associated  atelectasis or infiltrate. Underlying mass is not excluded. Clinical correlation and follow-up to resolution recommended. Left lung base linear atelectasis/scarring. There is no pneumothorax. There is mild cardiomegaly. There is atherosclerotic calcification of the aortic arch. Left pectoral pacemaker device. No acute osseous pathology. IMPRESSION: Right lung base opacity likely combination of pleural effusion and associated atelectasis/infiltrate. Clinical correlation and follow-up to resolution recommended to exclude underlying mass. Electronically Signed   By: Elgie Collard M.D.   On: 05/15/2017 22:36    Scheduled Meds: . azithromycin  500 mg Oral Q24H  . furosemide  60 mg Intravenous BID  . ipratropium-albuterol  3 mL Nebulization TID  . isosorbide mononitrate  30 mg Oral Daily  . sodium chloride flush  3 mL Intravenous Q12H  . warfarin  7.5 mg Oral ONCE-1800  . Warfarin - Pharmacist Dosing Inpatient   Does not apply q1800   Continuous Infusions: . sodium chloride    . cefTRIAXone (ROCEPHIN)  IV       LOS: 0 days   Rickey Barbara, MD [facility] Pager [phone]  If 7PM-7AM, please contact night-coverage [email or web address] Password [facility] 05/16/2017, 12:27 PM

## 2017-05-16 NOTE — Plan of Care (Signed)
  Problem: Clinical Measurements: Goal: Ability to maintain clinical measurements within normal limits will improve Outcome: Progressing Goal: Respiratory complications will improve Outcome: Progressing Goal: Cardiovascular complication will be avoided Outcome: Progressing   

## 2017-05-17 ENCOUNTER — Encounter (HOSPITAL_COMMUNITY): Payer: Self-pay | Admitting: Cardiology

## 2017-05-17 DIAGNOSIS — I350 Nonrheumatic aortic (valve) stenosis: Secondary | ICD-10-CM

## 2017-05-17 DIAGNOSIS — Z9359 Other cystostomy status: Secondary | ICD-10-CM

## 2017-05-17 DIAGNOSIS — N179 Acute kidney failure, unspecified: Secondary | ICD-10-CM

## 2017-05-17 DIAGNOSIS — J9621 Acute and chronic respiratory failure with hypoxia: Secondary | ICD-10-CM

## 2017-05-17 LAB — BASIC METABOLIC PANEL
ANION GAP: 11 (ref 5–15)
BUN: 24 mg/dL — ABNORMAL HIGH (ref 6–20)
CHLORIDE: 81 mmol/L — AB (ref 101–111)
CO2: 44 mmol/L — AB (ref 22–32)
CREATININE: 0.86 mg/dL (ref 0.61–1.24)
Calcium: 8.7 mg/dL — ABNORMAL LOW (ref 8.9–10.3)
GFR calc non Af Amer: >60 mL/min (ref >60)
Glucose, Bld: 103 mg/dL — ABNORMAL HIGH (ref 65–99)
POTASSIUM: 4 mmol/L (ref 3.5–5.1)
SODIUM: 136 mmol/L (ref 135–145)

## 2017-05-17 LAB — URINE CULTURE

## 2017-05-17 LAB — EXPECTORATED SPUTUM ASSESSMENT W GRAM STAIN, RFLX TO RESP C: Special Requests: NORMAL

## 2017-05-17 LAB — PROTIME-INR
INR: 1.57
Prothrombin Time: 18.6 seconds — ABNORMAL HIGH (ref 11.4–15.2)

## 2017-05-17 MED ORDER — WARFARIN SODIUM 5 MG PO TABS
7.5000 mg | ORAL_TABLET | Freq: Once | ORAL | Status: AC
  Administered 2017-05-17: 18:00:00 7.5 mg via ORAL
  Filled 2017-05-17: qty 1

## 2017-05-17 MED ORDER — SENNOSIDES-DOCUSATE SODIUM 8.6-50 MG PO TABS
1.0000 | ORAL_TABLET | Freq: Two times a day (BID) | ORAL | Status: DC
  Administered 2017-05-17 – 2017-05-20 (×6): 1 via ORAL
  Filled 2017-05-17 (×6): qty 1

## 2017-05-17 MED ORDER — BISACODYL 10 MG RE SUPP
10.0000 mg | Freq: Every day | RECTAL | Status: AC
  Administered 2017-05-17 – 2017-05-18 (×2): 10 mg via RECTAL
  Filled 2017-05-17 (×2): qty 1

## 2017-05-17 MED ORDER — LIDOCAINE 5 % EX PTCH
1.0000 | MEDICATED_PATCH | Freq: Once | CUTANEOUS | Status: AC
  Administered 2017-05-17: 1 via TRANSDERMAL
  Filled 2017-05-17: qty 1

## 2017-05-17 MED ORDER — LIVING BETTER WITH HEART FAILURE BOOK
Freq: Once | Status: AC
  Administered 2017-05-17: 09:00:00

## 2017-05-17 MED ORDER — LIDOCAINE 5 % EX PTCH
1.0000 | MEDICATED_PATCH | CUTANEOUS | Status: DC
  Administered 2017-05-17: 1 via TRANSDERMAL
  Filled 2017-05-17: qty 1

## 2017-05-17 MED ORDER — POLYETHYLENE GLYCOL 3350 17 G PO PACK
17.0000 g | PACK | Freq: Every day | ORAL | Status: DC
  Administered 2017-05-17 – 2017-05-20 (×4): 17 g via ORAL
  Filled 2017-05-17 (×4): qty 1

## 2017-05-17 MED ORDER — LIDOCAINE 5 % EX PTCH
2.0000 | MEDICATED_PATCH | CUTANEOUS | Status: DC
  Administered 2017-05-18 – 2017-05-20 (×3): 2 via TRANSDERMAL
  Filled 2017-05-17 (×3): qty 2
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
## 2017-05-17 NOTE — Progress Notes (Signed)
OT Cancellation Note  Patient Details Name: Jacqualin CombesWalter C Timberman Jr. MRN: [id number] DOB: 01/23/1936   Cancelled Treatment:    Reason Eval/Treat Not Completed: Fatigue/lethargy limiting ability to participate Pt agreed for OT to see him in the morning  Lise AuerLori Erek Kowal, [place] [phone] Einar CrowEDDING, Janyth Riera D 05/17/2017, 2:50 PM

## 2017-05-17 NOTE — Consult Note (Addendum)
Cardiology Consultation:   Patient ID: Howard Torres.; [id number]; 25-Nov-1935   Admit date: 05/15/2017 Date of Consult: 05/17/2017  Primary Care Provider: Aida Puffer, MD Primary Cardiologist: Thurmon Fair, MD  Primary Electrophysiologist:  NA   Patient Profile:   Howard Torres. is a 82 y.o. male with a hx of permanent a fib, stenosis, pulmonary HTN, pacemaker, MDT adapta, for bradycardia, angina improved with nitrates, who is being seen today for the evaluation of CHF at the request of Dr. Roda Shutters.  History of Present Illness:   Howard Torres has a hx of permanent a fib, aortic stenosis, pulmonary HTN, pacemaker, MDT adapta, for bradycardia, angina improved with nitrates, pulmonary HTN,and on coumadin for anticoagulation.   He had admit for HF in 01/2018, now admitted with difficulty urinating, and decreased output from suprapubic cath.  He had supra pubic cath exchanged yesterday prior to admit.  He has increased abd pain.  He had considerable lower ext edema.    EKG I personally reviewed  a fib and V pacing   BNP 246  Troponin 0.02;  0.05; 0.05; 0.04  Cr 1.20 on admit now 0.86. WBC 6.9, Hgb 12.4, WBC 6.9  pts 136. INR 1.57  UA with + nitrites , large Hgb  CXR:  Right lung base opacity likely combination of pleural effusion and associated atelectasis/infiltrate. Clinical correlation and follow-up to resolution recommended to exclude underlying mass.  CT of abd wit small R pl effusions-increased since 01/2017, with associated partial compressive atelectasis of Rt lower lobe, A 12 x 7 x 7 cm ovoid fluid attenuating structure at the right costophrenic angle likely represents a loculated component of the pleural effusion. Left lung base linear atelectasis/scarring. There is mild cardiomegaly. Multi vessel coronary vascular calcification and cardiac pacemaker wires noted  No hydronephrosis --There is diffuse  thickening of the bladder wall which may be related to chronic    infection. Correlation with urinalysis recommended.  Diffuse subcutaneous edema Cirrhosis and small ascites.   Pt in Oct did not wish to have cardiac cath.  Last echogram was in 2018 showing EF 50-55% moderately dilated left atrium.  PA peak pressure 55 mmHg  In ER with exertion he desated to mid 80s.   He is treated for CAP though unsure if true PNA vs. Atelectasis,  CHA2DS2 vas score 4 : continue current anticoagulation with  Coumadin per pharmacy,   He has rec'd Lasix 60 IV BID He is neg 3,050 since admit and wt down 10 lbs.    Currently has had some chest pain but not much.  His breathing and lower ext edema have improved today. With the 10 lb wt loss. Please not ASA was stopped and pt placed on small red pill, unsure dose.      Past Medical History:  Diagnosis Date  . Chest pain syndrome   . CHF (congestive heart failure) (HCC)   . COPD (chronic obstructive pulmonary disease) (HCC)   . Dyslipidemia   . GERD (gastroesophageal reflux disease)   . OSA (obstructive sleep apnea)   . Pacemaker   . Permanent atrial fibrillation (HCC)   . Pulmonary hypertension (HCC)     Past Surgical History:  Procedure Laterality Date  . HERNIA REPAIR    . NM MYOCAR PERF WALL MOTION  11/25/2008   normal  . PERMANENT PACEMAKER INSERTION  11/16/2009   medtronic  . PROSTATE SURGERY       Home Medications:  Prior to Admission medications   Medication Sig  Start Date End Date Taking? Authorizing Provider  albuterol (PROVENTIL HFA;VENTOLIN HFA) 108 (90 BASE) MCG/ACT inhaler Inhale 2 puffs into the lungs every 6 (six) hours as needed for wheezing or shortness of breath.   Yes [provider]  ALPRAZolam Prudy Feeler) 1 MG tablet Take 0.5-1 mg by mouth daily as needed for anxiety.   Yes [provider]  cyanocobalamin (,VITAMIN B-12,) 1000 MCG/ML injection Inject 1,000 mcg into the muscle every 30 (thirty) days.   Yes [provider]  furosemide (LASIX) 40 MG tablet TAKE 1  TABLET TWICE A DAY TO MAKE HEART WORK BETTER 04/18/17  Yes [provider]  furosemide (LASIX) 40 MG tablet Take 40 mg by mouth 2 (two) times daily.   Yes [provider]  omeprazole (PRILOSEC) 20 MG capsule Take 20 mg by mouth daily.   Yes [provider]  oxyCODONE-acetaminophen (PERCOCET) 10-325 MG per tablet Take 1 tablet by mouth every 6 (six) hours as needed for pain.    Yes [provider]  potassium chloride SA (K-DUR,KLOR-CON) 20 MEQ tablet Take 1 tablet (20 mEq total) by mouth 2 (two) times daily. Patient taking differently: Take 20 mEq by mouth daily.  02/11/17  Yes Pearson Grippe, MD  warfarin (COUMADIN) 5 MG tablet TAKE 1 TO 1 AND 1/2 TABLETS BY MOUTH DAILY AS DIRECTED BY COUMADIN CLINIC Patient taking differently: TAKE 1.5 TABLETS (7.5 MG) BY MOUTH ON MWF AND TAKE 1 TABLET (5 MG) ALL OTHER DAYS 12/05/16  Yes Sheronda Parran, MD  furosemide (LASIX) 20 MG tablet Take 3 tablets (60 mg total) by mouth 2 (two) times daily. Patient not taking: Reported on 05/15/2017 02/11/17   Pearson Grippe, MD  isosorbide mononitrate (IMDUR) 30 MG 24 hr tablet Take 1 tablet (30 mg total) by mouth daily. 08/24/16 11/22/16  Astraea Gaughran, MD  losartan (COZAAR) 25 MG tablet Take 0.5 tablets (12.5 mg total) by mouth daily. Patient not taking: Reported on 05/15/2017 02/11/17   Pearson Grippe, MD  nitroGLYCERIN (NITROSTAT) 0.4 MG SL tablet Place 1 tablet (0.4 mg total) under the tongue every 5 (five) minutes as needed for chest pain. 08/24/16   Allyn Bertoni, MD  sodium chloride (OCEAN) 0.65 % nasal spray Place 1 spray into the nose as needed for congestion.    [provider]  Vitamin D, Ergocalciferol, (DRISDOL) 50000 UNITS CAPS Take 50,000 Units by mouth every 7 (seven) days. Wednesdays.    [provider]    Inpatient Medications: Scheduled Meds: . azithromycin  500 mg Oral Q24H  . furosemide  60 mg Intravenous BID  . ipratropium-albuterol  3 mL Nebulization TID  .  isosorbide mononitrate  30 mg Oral Daily  . pantoprazole  40 mg Oral Daily  . sodium chloride flush  3 mL Intravenous Q12H  . warfarin  7.5 mg Oral ONCE-1800  . Warfarin - Pharmacist Dosing Inpatient   Does not apply q1800   Continuous Infusions: . sodium chloride    . cefTRIAXone (ROCEPHIN)  IV Stopped (05/16/17 2342)   PRN Meds: sodium chloride, acetaminophen, albuterol, ALPRAZolam, ondansetron (ZOFRAN) IV, oxyCODONE-acetaminophen **AND** oxyCODONE, sodium chloride flush  Allergies:   No Known Allergies  Social History:   Social History   Socioeconomic History  . Marital status: Divorced    Spouse name: Not on file  . Number of children: Not on file  . Years of education: Not on file  . Highest education level: Not on file  Occupational History  . Not on file  Social  Needs  . Financial resource strain: Not on file  . Food insecurity:    Worry: Not on file    Inability: Not on file  . Transportation needs:    Medical: Not on file    Non-medical: Not on file  Tobacco Use  . Smoking status: Current Every Day Smoker    Packs/day: 1.00    Years: 65.00    Pack years: 65.00    Types: Cigarettes  . Smokeless tobacco: Never Used  Substance and Sexual Activity  . Alcohol use: No    Alcohol/week: 0.0 oz  . Drug use: No  . Sexual activity: Never  Lifestyle  . Physical activity:    Days per week: Not on file    Minutes per session: Not on file  . Stress: Not on file  Relationships  . Social connections:    Talks on phone: Not on file    Gets together: Not on file    Attends religious service: Not on file    Active member of club or organization: Not on file    Attends meetings of clubs or organizations: Not on file    Relationship status: Not on file  . Intimate partner violence:    Fear of current or ex partner: Not on file    Emotionally abused: Not on file    Physically abused: Not on file    Forced sexual activity: Not on file  Other Topics Concern  . Not on  file  Social History Narrative  . Not on file    Family History:    Family History  Problem Relation Age of Onset  . Colon cancer Mother   . CVA Mother   . Heart attack Mother   . Heart failure Father   . Lung disease Father   . Lung cancer Brother   . Heart attack Brother      ROS:  Please see the history of present illness.  General:no colds or fevers, no weight changes Skin:no rashes or ulcers HEENT:no blurred vision, no congestion CV:see HPI PUL:see HPI GI:no diarrhea ++constipation no BM in several days per pt. no melena, no indigestion GU:no hematuria, no dysuria MS:no joint pain, no claudication Neuro:no syncope, no lightheadedness Endo:no diabetes, no thyroid disease  All other ROS reviewed and negative.     Physical Exam/Data:   Vitals:   05/17/17 0827 05/17/17 0839 05/17/17 1011 05/17/17 1012  BP:  120/65    Pulse:  62    Resp:  18    Temp:      TempSrc:      SpO2: 94% 97% (!) 79% (!) 88%  Weight:      Height:        Intake/Output Summary (Last 24 hours) at 05/17/2017 1302 Last data filed at 05/17/2017 1240 Gross per 24 hour  Intake 1660 ml  Output 3050 ml  Net -1390 ml   Filed Weights   05/16/17 0202 05/17/17 0504  Weight: 195 lb 5.2 oz (88.6 kg) 185 lb 11.2 oz (84.2 kg)   Body mass index is 28.24 kg/m.  General:  Well nourished, well developed, in no acute distress- but + fatigue HEENT: normal Lymph: no adenopathy Neck: no JVD Endocrine:  No thryomegaly Vascular: No carotid bruits; 2+ pedal pulses  Cardiac:  normal S1, S2; RRR; 2-3 murmur aortic outflow  Lungs:  Bilateral breath sounds to auscultation bilaterally, though very diminished Rt base, few rales no wheezing, rhonchi   Abd: soft, nontender, no hepatomegaly  Ext:  Lt leg with 1-2 + edema, and  Rt leg edema 2-3 + edema Musculoskeletal:  No deformities, BUE and BLE strength normal and equal Skin: warm and dry  Neuro:  Alert and oriented X 3 MAE, follows commands,  no focal  abnormalities noted Psych:  Normal affect    Telemetry:  Telemetry was personally reviewed and demonstrates:  A fib with V pacing.  Relevant CV Studies: Echo 05/16/17  Study Conclusions  - Left ventricle: Flattening of the septum consistent with   pulmonary HTN. The cavity size was normal. Wall thickness was   normal. Systolic function was normal. The estimated ejection   fraction was in the range of 50% to 55%. The study is not   technically sufficient to allow evaluation of LV diastolic   function. - Aortic valve: There was moderate stenosis. Valve area (VTI): 0.89   cm^2. Valve area (Vmax): 0.96 cm^2. Valve area (Vmean): 0.84   cm^2. - Mitral valve: Nodular calcification of the mitral leaflet tips.   There was mild regurgitation. - Left atrium: The atrium was moderately dilated. - Right ventricle: The cavity size was moderately dilated. - Right atrium: The atrium was severely dilated. - Atrial septum: No defect or patent foramen ovale was identified. - Pulmonary arteries: PA peak pressure: 62 mm Hg (S).   Laboratory Data:  Chemistry Recent Labs  Lab 05/15/17 2249 05/17/17 0433  NA 133* 136  K 4.7 4.0  CL 84* 81*  CO2 40* 44*  GLUCOSE 100* 103*  BUN 35* 24*  CREATININE 1.20 0.86  CALCIUM 8.5* 8.7*  GFRNONAA 56* >60  GFRAA >60 >60  ANIONGAP 9 11    Recent Labs  Lab 05/15/17 2249  PROT 6.3*  ALBUMIN 3.5  AST 26  ALT 15*  ALKPHOS 58  BILITOT 1.3*   Hematology Recent Labs  Lab 05/15/17 2249  WBC 6.9  RBC 4.43  HGB 12.4*  HCT 41.7  MCV 94.1  MCH 28.0  MCHC 29.7*  RDW 15.1  PLT 136*   Cardiac Enzymes Recent Labs  Lab 05/16/17 0127 05/16/17 0500 05/16/17 1243  TROPONINI 0.05* 0.05* 0.04*    Recent Labs  Lab 05/15/17 2256  TROPIPOC 0.02    BNP Recent Labs  Lab 05/15/17 2249  BNP 246.3*    DDimer No results for input(s): DDIMER in the last 168 hours.  Radiology/Studies:  Ct Abdomen Pelvis Wo Contrast  Result Date:  05/15/2017 CLINICAL DATA:  82 year old male with flank pain. Concern for kidney stone. EXAM: CT ABDOMEN AND PELVIS WITHOUT CONTRAST TECHNIQUE: Multidetector CT imaging of the abdomen and pelvis was performed following the standard protocol without IV contrast. COMPARISON:  Abdominal CT dated 02/08/2017 FINDINGS: Evaluation of this exam is limited in the absence of intravenous contrast. Lower chest: Partially visualized small right pleural effusion with associated partial compressive atelectasis of the right lower lobe. Superimposed pneumonia is not excluded. Clinical correlation is recommended. A 12 x 7 x 7 cm ovoid fluid attenuating structure at the right costophrenic angle likely represents a loculated component of the pleural effusion. Left lung base linear atelectasis/scarring. There is mild cardiomegaly. Multi vessel coronary vascular calcification and cardiac pacemaker wires noted. There is no intra-abdominal free air.  Small ascites. Hepatobiliary: There is morphologic changes of cirrhosis. There are multiple stones in the gallbladder. Pancreas: Unremarkable. No pancreatic ductal dilatation or surrounding inflammatory changes. Spleen: Normal in size without focal abnormality. Adrenals/Urinary Tract: The adrenal glands are unremarkable. Small right renal exophytic hypodense lesions are not characterized on  this unenhanced CT. Ultrasound may provide better characterization. There is no hydronephrosis or nephrolithiasis on either side. The visualized ureters are grossly unremarkable. The urinary bladder is decompressed around a suprapubic catheter. There is diffuse thickening of the bladder wall which may be related to chronic infection. Correlation with urinalysis recommended. Stomach/Bowel: There is extensive colonic diverticulosis without active inflammatory changes. There is no bowel obstruction. Thickened appearance of the stomach likely related to underdistention. Clinical correlation is recommended. The  appendix is not visualized with certainty. No inflammatory changes identified in the right lower quadrant. Vascular/Lymphatic: There is moderate aortoiliac atherosclerotic disease. No portal venous gas. There is no adenopathy. Reproductive: The prostate and seminal vesicles are grossly unremarkable. Other: There is midline vertical anterior pelvic wall incisional scar. Diffuse subcutaneous edema. Musculoskeletal: There is osteopenia with multilevel degenerative changes of the spine. Grade 1 L4-L5 anterolisthesis. Multilevel disc desiccation with vacuum phenomena. No acute osseous pathology. IMPRESSION: 1. Cirrhosis and small ascites. 2. Extensive colonic diverticulosis. No bowel obstruction or active inflammation. 3. No hydronephrosis or nephrolithiasis. Thickened urinary bladder likely related to chronic infection. Correlation with urinalysis recommended. 4. Cholelithiasis. 5. Small right pleural effusion, increased since the prior CT. Electronically Signed   By: Elgie CollardArash  Radparvar M.D.   On: 05/15/2017 22:48   Dg Chest 2 View  Result Date: 05/15/2017 CLINICAL DATA:  82 year old male with shortness of breath. EXAM: CHEST - 2 VIEW COMPARISON:  Chest radiograph dated 02/08/2017 FINDINGS: Right lung base opacity has increased since the prior radiograph and most likely represent combination of pleural effusion and associated atelectasis or infiltrate. Underlying mass is not excluded. Clinical correlation and follow-up to resolution recommended. Left lung base linear atelectasis/scarring. There is no pneumothorax. There is mild cardiomegaly. There is atherosclerotic calcification of the aortic arch. Left pectoral pacemaker device. No acute osseous pathology. IMPRESSION: Right lung base opacity likely combination of pleural effusion and associated atelectasis/infiltrate. Clinical correlation and follow-up to resolution recommended to exclude underlying mass. Electronically Signed   By: Elgie CollardArash  Radparvar M.D.   On:  05/15/2017 22:36    Assessment and Plan:   1. Acute on chronic diastolic HF on lasix 60 mg BID, and now neg 3050  And wt down 10 lbs.  Would continue current dose of lasix.  He is improving. Cr is improved as well  Rt pleural effusion   2.        Permanent a fib and pacing 100% with MDT pacer.  On coumadin INR is subtherapeutic managed by pharmacy CHA2DS2 vas score 4:   3.        Aortic stenosis  Per Dr. Royann Shiversroitoru but may have increased from mild to moderate.    4.        Pulmonary HTN with PA pk pressure 62 mmHg.    5.        Possible PNA per IM  6.         HLD on statin.     For questions or updates, please contact [facility] Please consult [email or web address] for contact info under Cardiology/STEMI.   Signed, Nada BoozerLaura Ingold, NP  05/17/2017 1:02 PM   I have seen and examined the patient along with Nada BoozerLaura Ingold, NP .  I have reviewed the chart, notes and new data.  I agree with PA/NP's note.  Key new complaints: presents with heart failure, R>>>L; he has already had a net diuresis of 4.5 L and edema is substantially better, breathing largely unchanged. Key examination changes: emphysematous chest and bilateral wheezing, reduced  breath sounds in R base, regular rhythm, split S2, early peaking AS murmur; 2+ symmetrical edema with chronic brawny skin changes Key new findings / data: echo reviewed - still has moderate AS (the reduction in AV area is due to under-measurement of LVOT; the gradients have not changed). Creatinine improving w diuresis. ECG 100% AFib V paced  PLAN: Continue diuresis - his DC weight 3 months ago was 181 lb and I suspect he has lost additional "true weight" since then. Would diurese to 180 lb or less. Needs a new scale - he cannot see the current one. This will be very important to detect fluid gain early and prevent readmission. Reviewed smoking cessation (he will not commit to that) and sodium restriction (he admits to occasional lapses). He has been out of "his  little red pill" (presumably isosorbide) for "a while" and has not had angina. It is therefore not necessary to restart it.  Thurmon Fair, MD, [facility] [phone] 05/17/2017, 6:45 PM

## 2017-05-17 NOTE — Progress Notes (Signed)
ANTICOAGULATION CONSULT NOTE  Pharmacy Consult for Warfarin Indication: atrial fibrillation  No Known Allergies   Height: 5\' 8"  (172.7 cm) Weight: 195 lb 5.2 oz (88.6 kg) IBW/kg (Calculated) : 68.4   Vital Signs: Temp: 97.8 F (36.6 C) (04/03 0504) Temp Source: Oral (04/03 0504) BP: 108/60 (04/03 0504) Pulse Rate: 68 (04/03 0504)  Labs: Recent Labs    05/15/17 2249 05/16/17 0127 05/16/17 0445 05/16/17 0500 05/16/17 1243 05/17/17 0433  HGB 12.4*  --   --   --   --   --   HCT 41.7  --   --   --   --   --   PLT 136*  --   --   --   --   --   LABPROT 19.4*  --  19.7*  --   --  18.6*  INR 1.65  --  1.69  --   --  1.57  CREATININE 1.20  --   --   --   --  0.86  TROPONINI  --  0.05*  --  0.05* 0.04*  --     Estimated Creatinine Clearance: 75.4 mL/min (by C-G formula based on SCr of 0.86 mg/dL).  Assessment: 81 yoM on chronic warfarin for A-fib. HD 7.5 mg M/W/F and 5 mg other days.  LD 4/1 1500  INR=1.65 on admission 05/17/2017 INR 1.57 - below goal after 7.5 mg x 2 days Admission: Hg 12.4, pltc low at 136.   No bleeding reported.   Goal of Therapy:  INR 2-3   Plan:  Coumadin 7.5 mg po x 1 dsoe Daily INR  Herby AbrahamMichelle T. Nikoleta Dady, Pharm.D. [phone] 05/17/2017 7:32 AM

## 2017-05-17 NOTE — Progress Notes (Signed)
Care resumed for this patient from previous RN. Agree with previous assessment.

## 2017-05-17 NOTE — Plan of Care (Signed)
  Problem: Education: Goal: Knowledge of General Education information will improve Outcome: Progressing   Problem: Health Behavior/Discharge Planning: Goal: Ability to manage health-related needs will improve Outcome: Progressing   Problem: Clinical Measurements: Goal: Ability to maintain clinical measurements within normal limits will improve Outcome: Progressing Goal: Will remain free from infection Outcome: Progressing Note:  PO Zithromax, IV Rocephin Goal: Diagnostic test results will improve Outcome: Progressing Goal: Respiratory complications will improve Outcome: Progressing Note:  On 2LNC which is his baseline O2 requirement.  Goal: Cardiovascular complication will be avoided Outcome: Progressing Note:  BLE edema improving.    Problem: Activity: Goal: Risk for activity intolerance will decrease Outcome: Progressing   Problem: Elimination: Goal: Will not experience complications related to bowel motility Outcome: Progressing   Problem: Pain Managment: Goal: General experience of comfort will improve Outcome: Progressing   Problem: Safety: Goal: Ability to remain free from injury will improve Outcome: Progressing   Problem: Skin Integrity: Goal: Risk for impaired skin integrity will decrease Outcome: Progressing   Problem: Education: Goal: Ability to demonstrate management of disease process will improve Outcome: Progressing Goal: Ability to verbalize understanding of medication therapies will improve Outcome: Progressing   Problem: Activity: Goal: Capacity to carry out activities will improve Outcome: Progressing   Problem: Cardiac: Goal: Ability to achieve and maintain adequate cardiopulmonary perfusion will improve Outcome: Progressing

## 2017-05-17 NOTE — Evaluation (Addendum)
Physical Therapy Evaluation Patient Details Name: Howard Torres. MRN: [id number] DOB: 1935-12-23 Today's Date: 05/17/2017   History of Present Illness  82 y.o. male with medical history significant of permanent atrial fibrillation, suprapubic catheter, BPH, single chamber Medtronic Adapta permanent pacemaker, COPD on 2L at baseline, stasis dermatitis, HLD, GERD, OSA, pulmonary hypertension. Patient presents with progressive sob and cough productive of thick sputum. Dx of CAP  Clinical Impression  Pt admitted with above diagnosis. Pt currently with functional limitations due to the deficits listed below (see PT Problem List). Pt ambulated 120' with RW, SaO2 88% on 4L O2, SaO2 79% on room air at rest. Ambulation distance limited by back pain. Pt will benefit from skilled PT during acute stay to increase their independence and safety with mobility to allow discharge to the venue listed below.       Follow Up Recommendations Home health PT    Equipment Recommendations  None recommended by PT    Recommendations for Other Services       Precautions / Restrictions Precautions Precautions: Fall Precaution Comments: 2 falls in past 1 year, monitor O2 sats Restrictions Weight Bearing Restrictions: No      Mobility  Bed Mobility               General bed mobility comments: sitting on edge of bed  Transfers Overall transfer level: Independent   Transfers: Sit to/from Stand Sit to Stand: Independent            Ambulation/Gait Ambulation/Gait assistance: Supervision Ambulation Distance (Feet): 120 Feet Assistive device: Rolling walker (2 wheeled) Gait Pattern/deviations: Step-through pattern;Decreased stride length;Trunk flexed   Gait velocity interpretation: at or above normal speed for age/gender General Gait Details: SaO2 88% on 4L O2 walking, supervision for safety, no loss of balance, 1/4 dyspnea, distance limited by back pain  Stairs            Wheelchair  Mobility    Modified Rankin (Stroke Patients Only)       Balance Overall balance assessment: History of Falls;Needs assistance   Sitting balance-Leahy Scale: Good       Standing balance-Leahy Scale: Fair                               Pertinent Vitals/Pain Pain Assessment: 0-10 Pain Score: 2  Pain Location: back Pain Descriptors / Indicators: Aching Pain Intervention(s): Limited activity within patient's tolerance;Monitored during session;Premedicated before session    Home Living Family/patient expects to be discharged to:: Private residence Living Arrangements: Children Available Help at Discharge: Family;Available 24 hours/day Type of Home: House Home Access: Level entry     Home Layout: One level Home Equipment: Walker - 2 wheels;Cane - single point Additional Comments: on 2L O2 at home    Prior Function Level of Independence: Independent with assistive device(s)         Comments: walks with [facility]     Hand Dominance        Extremity/Trunk Assessment   Upper Extremity Assessment Upper Extremity Assessment: Defer to OT evaluation    Lower Extremity Assessment Lower Extremity Assessment: Overall WFL for tasks assessed    Cervical / Trunk Assessment Cervical / Trunk Assessment: Kyphotic  Communication   Communication: No difficulties  Cognition Arousal/Alertness: Awake/alert Behavior During Therapy: WFL for tasks assessed/performed Overall Cognitive Status: Within Functional Limits for tasks assessed  General Comments      Exercises     Assessment/Plan    PT Assessment Patient needs continued PT services  PT Problem List Decreased mobility;Decreased activity tolerance;Cardiopulmonary status limiting activity       PT Treatment Interventions Gait training;Therapeutic activities;Therapeutic exercise;Functional mobility training;Patient/family education    PT Goals (Current  goals can be found in the Care Plan section)  Acute Rehab PT Goals Patient Stated Goal: to walk farther PT Goal Formulation: With patient Time For Goal Achievement: 05/31/17 Potential to Achieve Goals: Good    Frequency Min 3X/week   Barriers to discharge        Co-evaluation               AM-PAC PT "6 Clicks" Daily Activity  Outcome Measure Difficulty turning over in bed (including adjusting bedclothes, sheets and blankets)?: A Little Difficulty moving from lying on back to sitting on the side of the bed? : A Little Difficulty sitting down on and standing up from a chair with arms (e.g., wheelchair, bedside commode, etc,.)?: None Help needed moving to and from a bed to chair (including a wheelchair)?: A Little Help needed walking in hospital room?: A Little Help needed climbing 3-5 steps with a railing? : A Lot 6 Click Score: 18    End of Session Equipment Utilized During Treatment: Gait belt;Oxygen Activity Tolerance: Patient tolerated treatment well Patient left: in bed;with call bell/phone within reach Nurse Communication: Mobility status;Other (comment);Patient requests pain meds(SaO2 sats with walking, pt requesting humidifier and mask for O2) PT Visit Diagnosis: Difficulty in walking, not elsewhere classified (R26.2);History of falling (Z91.81)    Time: [id number] PT Time Calculation (min) (ACUTE ONLY): 22 min   Charges:   PT Evaluation $PT Eval Low Complexity: 1 Low     PT G Codes:          Tamala SerUhlenberg, Genora Arp Kistler 05/17/2017, 10:17 AM [phone]

## 2017-05-18 DIAGNOSIS — I2781 Cor pulmonale (chronic): Secondary | ICD-10-CM

## 2017-05-18 DIAGNOSIS — I5033 Acute on chronic diastolic (congestive) heart failure: Secondary | ICD-10-CM

## 2017-05-18 DIAGNOSIS — K766 Portal hypertension: Secondary | ICD-10-CM

## 2017-05-18 DIAGNOSIS — I2721 Secondary pulmonary arterial hypertension: Secondary | ICD-10-CM

## 2017-05-18 LAB — COMPREHENSIVE METABOLIC PANEL
ALBUMIN: 3.2 g/dL — AB (ref 3.5–5.0)
ALT: 14 U/L — ABNORMAL LOW (ref 17–63)
ANION GAP: 11 (ref 5–15)
AST: 23 U/L (ref 15–41)
Alkaline Phosphatase: 51 U/L (ref 38–126)
BUN: 20 mg/dL (ref 6–20)
CO2: 45 mmol/L — AB (ref 22–32)
Calcium: 8.9 mg/dL (ref 8.9–10.3)
Chloride: 80 mmol/L — ABNORMAL LOW (ref 101–111)
Creatinine, Ser: 0.8 mg/dL (ref 0.61–1.24)
GFR calc non Af Amer: >60 mL/min (ref >60)
GLUCOSE: 102 mg/dL — AB (ref 65–99)
POTASSIUM: 3.9 mmol/L (ref 3.5–5.1)
SODIUM: 136 mmol/L (ref 135–145)
Total Bilirubin: 0.8 mg/dL (ref 0.3–1.2)
Total Protein: 6.3 g/dL — ABNORMAL LOW (ref 6.5–8.1)

## 2017-05-18 LAB — MAGNESIUM: Magnesium: 1.7 mg/dL (ref 1.7–2.4)

## 2017-05-18 LAB — PROTIME-INR
INR: 1.78
PROTHROMBIN TIME: 20.5 s — AB (ref 11.4–15.2)

## 2017-05-18 LAB — CBC WITH DIFFERENTIAL/PLATELET
BASOS PCT: 0 %
Basophils Absolute: 0 10*3/uL (ref 0.0–0.1)
EOS ABS: 0.1 10*3/uL (ref 0.0–0.7)
EOS PCT: 1 %
HCT: 40.7 % (ref 39.0–52.0)
Hemoglobin: 11.8 g/dL — ABNORMAL LOW (ref 13.0–17.0)
Lymphocytes Relative: 15 %
Lymphs Abs: 1.1 10*3/uL (ref 0.7–4.0)
MCH: 27.8 pg (ref 26.0–34.0)
MCHC: 29 g/dL — AB (ref 30.0–36.0)
MCV: 95.8 fL (ref 78.0–100.0)
MONO ABS: 0.8 10*3/uL (ref 0.1–1.0)
MONOS PCT: 10 %
NEUTROS PCT: 74 %
Neutro Abs: 5.5 10*3/uL (ref 1.7–7.7)
PLATELETS: 151 10*3/uL (ref 150–400)
RBC: 4.25 MIL/uL (ref 4.22–5.81)
RDW: 15.1 % (ref 11.5–15.5)
WBC: 7.5 10*3/uL (ref 4.0–10.5)

## 2017-05-18 LAB — TSH: TSH: 1.863 u[IU]/mL (ref 0.350–4.500)

## 2017-05-18 MED ORDER — WARFARIN SODIUM 4 MG PO TABS
8.0000 mg | ORAL_TABLET | Freq: Once | ORAL | Status: AC
  Administered 2017-05-18: 8 mg via ORAL
  Filled 2017-05-18: qty 2

## 2017-05-18 MED ORDER — IPRATROPIUM-ALBUTEROL 0.5-2.5 (3) MG/3ML IN SOLN
3.0000 mL | Freq: Two times a day (BID) | RESPIRATORY_TRACT | Status: DC
  Filled 2017-05-18: qty 3

## 2017-05-18 MED ORDER — MAGNESIUM SULFATE IN D5W 1-5 GM/100ML-% IV SOLN
1.0000 g | Freq: Once | INTRAVENOUS | Status: AC
  Administered 2017-05-18: 1 g via INTRAVENOUS
  Filled 2017-05-18: qty 100

## 2017-05-18 NOTE — Progress Notes (Signed)
Pt found to be SOB in bed w/ O2 sat 50s. [place] on pt but not attached to O2. Pt reattached to [facility]3LNC with sat fluctuating between 84-87%.  Titrated up to [facility]LNC to maintain O2 sat 90-94% with decreased SOB.  Will continue to monitor closely and attempt to wean back down.

## 2017-05-18 NOTE — Progress Notes (Addendum)
Progress Note  Patient Name: Howard Torres. Date of Encounter: 05/18/2017  Primary Cardiologist: Thurmon Fair, MD   Subjective   No chest pain, no SOB, much improved, is able to lie almost flat to rest.  He did have BM.    Inpatient Medications    Scheduled Meds: . furosemide  60 mg Intravenous BID  . ipratropium-albuterol  3 mL Nebulization BID  . isosorbide mononitrate  30 mg Oral Daily  . lidocaine  2 patch Transdermal Q24H  . pantoprazole  40 mg Oral Daily  . polyethylene glycol  17 g Oral Daily  . senna-docusate  1 tablet Oral BID  . sodium chloride flush  3 mL Intravenous Q12H  . warfarin  8 mg Oral ONCE-1800  . Warfarin - Pharmacist Dosing Inpatient   Does not apply q1800   Continuous Infusions: . sodium chloride     PRN Meds: sodium chloride, acetaminophen, albuterol, ALPRAZolam, ondansetron (ZOFRAN) IV, oxyCODONE-acetaminophen **AND** oxyCODONE, sodium chloride flush   Vital Signs    Vitals:   05/17/17 1930 05/17/17 2030 05/18/17 0338 05/18/17 0901  BP:  (!) 123/58 (!) 121/58 (!) 108/55  Pulse:  70 63 77  Resp:  18 18 18   Temp:  98 F (36.7 C) 97.6 F (36.4 C)   TempSrc:  Oral Oral   SpO2: 92% 93% 93% 92%  Weight:   181 lb 11.2 oz (82.4 kg)   Height:        Intake/Output Summary (Last 24 hours) at 05/18/2017 1402 Last data filed at 05/18/2017 1051 Gross per 24 hour  Intake 340 ml  Output 3600 ml  Net -3260 ml   Filed Weights   05/16/17 0202 05/17/17 0504 05/18/17 0338  Weight: 195 lb 5.2 oz (88.6 kg) 185 lb 11.2 oz (84.2 kg) 181 lb 11.2 oz (82.4 kg)    Telemetry    A fib with v pacing at times - Personally Reviewed  ECG    No new  - Personally Reviewed  Physical Exam   GEN: No acute distress.   Neck: No JVD Cardiac: RRR, III/VI aortic murmur, no rubs, or gallops.  Respiratory: Clear to auscultation bilaterally, ant. No JVD GI: Soft, nontender, non-distended  MS: No edema; Much improved No deformity. Neuro:  Nonfocal  Psych:  Normal affect   Labs    Chemistry Recent Labs  Lab 05/15/17 2249 05/17/17 0433 05/18/17 0429  NA 133* 136 136  K 4.7 4.0 3.9  CL 84* 81* 80*  CO2 40* 44* 45*  GLUCOSE 100* 103* 102*  BUN 35* 24* 20  CREATININE 1.20 0.86 0.80  CALCIUM 8.5* 8.7* 8.9  PROT 6.3*  --  6.3*  ALBUMIN 3.5  --  3.2*  AST 26  --  23  ALT 15*  --  14*  ALKPHOS 58  --  51  BILITOT 1.3*  --  0.8  GFRNONAA 56* >60 >60  GFRAA >60 >60 >60  ANIONGAP 9 11 11      Hematology Recent Labs  Lab 05/15/17 2249 05/18/17 0429  WBC 6.9 7.5  RBC 4.43 4.25  HGB 12.4* 11.8*  HCT 41.7 40.7  MCV 94.1 95.8  MCH 28.0 27.8  MCHC 29.7* 29.0*  RDW 15.1 15.1  PLT 136* 151    Cardiac Enzymes Recent Labs  Lab 05/16/17 0127 05/16/17 0500 05/16/17 1243  TROPONINI 0.05* 0.05* 0.04*    Recent Labs  Lab 05/15/17 2256  TROPIPOC 0.02     BNP Recent Labs  Lab 05/15/17  2249  BNP 246.3*     DDimer No results for input(s): DDIMER in the last 168 hours.   Radiology    No results found.  Cardiac Studies   Echo 05/16/17  Study Conclusions  - Left ventricle: Flattening of the septum consistent with   pulmonary HTN. The cavity size was normal. Wall thickness was   normal. Systolic function was normal. The estimated ejection   fraction was in the range of 50% to 55%. The study is not   technically sufficient to allow evaluation of LV diastolic   function. - Aortic valve: There was moderate stenosis. Valve area (VTI): 0.89   cm^2. Valve area (Vmax): 0.96 cm^2. Valve area (Vmean): 0.84   cm^2. - Mitral valve: Nodular calcification of the mitral leaflet tips.   There was mild regurgitation. - Left atrium: The atrium was moderately dilated. - Right ventricle: The cavity size was moderately dilated. - Right atrium: The atrium was severely dilated. - Atrial septum: No defect or patent foramen ovale was identified. - Pulmonary arteries: PA peak pressure: 62 mm Hg (S).   Patient Profile     82 y.o.  male with a hx of permanent a fib, on coumadin, aortic stenosis, pulmonary HTN, pacemaker, MDT adapta, for bradycardia, angina improved with nitrates, chronic suprapubic cath.  Now admitted with increased edema for diuresis.  He does continue to smoke.   Assessment & Plan    Acute on chronic diastolic HF on Lasix 60 mg IV BID BNP was 246 --neg. 6,310 since admit and wt down from 195 to 181.   --improved lower ext edema --Cr. Is stable at 0.80  --change back to lasix 40 mg BID po in AM   Permanent a fib and PPM MDT, today with intrinsic rhythm and paced.   --INR 1.78  On coumadin CHA2DS2 vas score 4:  Aortic stenosis - ? Increase of severity.  Dr. Royann Shiversroitoru to address.    Pulmonary HTN with PA pk pressure 62 mmHg   Possible PNA per IM  HLD on statin continue   Chronic supra pubic catheter per IM.   For questions or updates, please contact [facility] Please consult [email or web address] for contact info under Cardiology/STEMI.      Signed, Nada BoozerLaura Ingold, NP  05/18/2017, 2:02 PM    I have seen and examined the patient along with Nada BoozerLaura Ingold, NP .  I have reviewed the chart, notes and new data.  I agree with NP's note.  Key new complaints: He feels poorly in general, but denies dyspnea.  Edema substantially improved.  Weight is down to his discharge weight from his last December admission Key examination changes: Still has some trace pedal and ankle edema and JVD roughly 4-5 cm elevation, regular rhythm with paradoxically split S2, early peaking 2-3/6 aortic ejection murmur Key new findings / data: Creatinine remains normal.  PLAN: Excellent response to diuretics.  Switch to oral diuretics tomorrow and then observe for 24 hours prior to discharge.  Will need to give him strict instructions regarding daily weight monitoring, weight log and an birthweight at which he should call his provider for advice regarding diuretic dose adjustment, in order to prevent hospitalization.  We will also  need an early, transition of care, visit (which we will set up).  Thurmon FairMihai Aileena Iglesia, MD, [facility] [phone] 05/18/2017, 3:40 PM

## 2017-05-18 NOTE — Evaluation (Signed)
Occupational Therapy Evaluation Patient Details Name: Howard Torres. MRN: [id number] DOB: October 16, 1935 Today's Date: 05/18/2017    History of Present Illness 82 y.o. male with medical history significant of permanent atrial fibrillation, BPH, suprapubic catheter,  single chamber Medtronic Adapta permanent pacemaker, COPD on 2L at baseline, stasis dermatitis, HLD, GERD, OSA, pulmonary hypertension. Patient presents with progressive sob and cough productive of thick sputum. Dx of CAP   Clinical Impression   Pt admitted with SOB and cough. Pt currently with functional limitations due to the deficits listed below (see OT Problem List).  Pt will benefit from skilled OT to increase their safety and independence with ADL and functional mobility for ADL to facilitate discharge to venue listed below.      Follow Up Recommendations  No OT follow up    Equipment Recommendations  None recommended by OT    Recommendations for Other Services       Precautions / Restrictions Precautions Precautions: Fall Precaution Comments: monitor O2 sats      Mobility Bed Mobility Overal bed mobility: Modified Independent             General bed mobility comments: increased time  Transfers Overall transfer level: Needs assistance Equipment used: Straight cane Transfers: Sit to/from Stand Sit to Stand: Supervision         General transfer comment: cane    Balance Overall balance assessment: History of Falls;Needs assistance   Sitting balance-Leahy Scale: Good       Standing balance-Leahy Scale: Fair                             ADL either performed or assessed with clinical judgement   ADL Overall ADL's : Needs assistance/impaired Eating/Feeding: Set up;Sitting   Grooming: Sitting;Set up   Upper Body Bathing: Set up;Sitting   Lower Body Bathing: Minimal assistance;Sit to/from stand;Cueing for safety;Cueing for sequencing   Upper Body Dressing : Set up;Sitting    Lower Body Dressing: Sit to/from stand;Moderate assistance;Cueing for safety;Cueing for sequencing Lower Body Dressing Details (indicate cue type and reason): would benefit from a sock aid Toilet Transfer: Minimal assistance;Ambulation;Cueing for sequencing;Cueing for safety Toilet Transfer Details (indicate cue type and reason): cane Toileting- Clothing Manipulation and Hygiene: Minimal assistance;Sit to/from stand         General ADL Comments: pts toenails so long they are curved and digging in back of toes.  RN notified.     Vision Patient Visual Report: No change from baseline       Perception     Praxis      Pertinent Vitals/Pain Pain Assessment: 0-10 Pain Score: 2  Pain Location: back Pain Descriptors / Indicators: Aching Pain Intervention(s): Limited activity within patient's tolerance;Monitored during session     Hand Dominance     Extremity/Trunk Assessment Upper Extremity Assessment Upper Extremity Assessment: Overall WFL for tasks assessed           Communication Communication Communication: No difficulties   Cognition Arousal/Alertness: Awake/alert Behavior During Therapy: WFL for tasks assessed/performed Overall Cognitive Status: Within Functional Limits for tasks assessed                                                Home Living Family/patient expects to be discharged to:: Private residence Living Arrangements: Children Available Help at  Discharge: Family;Available 24 hours/day Type of Home: House Home Access: Level entry     Home Layout: One level         Bathroom Toilet: Standard     Home Equipment: Walker - 2 wheels;Cane - single point   Additional Comments: on 2L O2 at home      Prior Functioning/Environment Level of Independence: Independent with assistive device(s)        [facility]        OT Problem List: Decreased strength;Decreased activity tolerance;Impaired balance (sitting  and/or standing);Decreased knowledge of use of DME or AE      OT Treatment/Interventions: Self-care/ADL training;Patient/family education;DME and/or AE instruction    OT Goals(Current goals can be found in the care plan section) Acute Rehab OT Goals Patient Stated Goal: to walk farther OT Goal Formulation: With patient Time For Goal Achievement: 06/01/17 Potential to Achieve Goals: Good  OT Frequency: Min 2X/week    AM-PAC PT "6 Clicks" Daily Activity     Outcome Measure Help from another person eating meals?: None Help from another person taking care of personal grooming?: None Help from another person toileting, which includes using toliet, bedpan, or urinal?: A Little Help from another person bathing (including washing, rinsing, drying)?: A Little Help from another person to put on and taking off regular upper body clothing?: None Help from another person to put on and taking off regular lower body clothing?: A Little 6 Click Score: 21   End of Session Nurse Communication: Mobility status  Activity Tolerance: Patient limited by fatigue Patient left: in bed;with call bell/phone within reach  OT Visit Diagnosis: Unsteadiness on feet (R26.81);Muscle weakness (generalized) (M62.81)                Time: [id number] OT Time Calculation (min): 17 min Charges:  OT General Charges $OT Visit: 1 Visit OT Evaluation $OT Eval Moderate Complexity: 1 Mod G-Codes:     Lise AuerLori Deondrae Mcgrail, [place]OT [phone]  Einar CrowEDDING, Della Homan D 05/18/2017, 1:58 PM

## 2017-05-18 NOTE — Progress Notes (Signed)
ANTICOAGULATION CONSULT NOTE  Pharmacy Consult for Warfarin Indication: atrial fibrillation  No Known Allergies  Height: 5\' 8"  (172.7 cm) Weight: 181 lb 11.2 oz (82.4 kg) IBW/kg (Calculated) : 68.4   Vital Signs: Temp: 97.6 F (36.4 C) (04/04 0338) Temp Source: Oral (04/04 0338) BP: 121/58 (04/04 0338) Pulse Rate: 63 (04/04 0338)  Labs: Recent Labs    05/15/17 2249 05/16/17 0127 05/16/17 0445 05/16/17 0500 05/16/17 1243 05/17/17 0433 05/18/17 0429  HGB 12.4*  --   --   --   --   --  11.8*  HCT 41.7  --   --   --   --   --  40.7  PLT 136*  --   --   --   --   --  151  LABPROT 19.4*  --  19.7*  --   --  18.6* 20.5*  INR 1.65  --  1.69  --   --  1.57 1.78  CREATININE 1.20  --   --   --   --  0.86 0.80  TROPONINI  --  0.05*  --  0.05* 0.04*  --   --     Estimated Creatinine Clearance: 78.4 mL/min (by C-G formula based on SCr of 0.8 mg/dL).  Assessment: 81 yoM on chronic warfarin for A-fib. HD 7.5 mg M/W/F and 5 mg other days.  LD 4/1 at 1500  INR=1.65 on admission  05/18/2017 INR 1.78 - below goal after 7.5 mg x 2 days Hgb 11.8, Platelets 151.   No bleeding reported.   Goal of Therapy:  INR 2-3   Plan:  Warfarin 8mg  today at 1800 Daily INR  Otho BellowsGreen, Tye Juarez L PharmD Pager [phone] 05/18/2017, 9:02 AM

## 2017-05-18 NOTE — Progress Notes (Signed)
PROGRESS NOTE  Howard CombesWalter C Snider Jr. ZHY:[id number] DOB: 06/20/1935 DOA: 05/15/2017 PCP: Aida PufferLittle, James, MD  HPI/Recap of past 24 hours:   Urine output 4.5 liter last 24hrs Breathing is better, less edema, on 2liter oxygen ( baseline on 2liter), no cough noted during encounter, no fever, denies chest pain. C/o back pain, asks for trapeze bar ,states he uses that at home due to back pain bmx1 Son at bed side  Assessment/Plan: Active Problems:   Hypercholesterolemia   Tobacco use disorder   HYPERTENSION, BENIGN SYSTEMIC   CAD (coronary artery disease)   Atrial fibrillation with slow ventricular response (HCC)   Hyperplasia of prostate   OSA (obstructive sleep apnea)   COPD GOLD 0   Acute on chronic combined systolic and diastolic CHF (congestive heart failure) (HCC)   Pulmonary hypertension (HCC)   CAP (community acquired pneumonia)   Chronic respiratory failure with hypoxia (HCC)   Pleural effusion   Fluid overload   CHF (congestive heart failure), NYHA class II, acute, diastolic (HCC)   Chronic cystitis  Acute on chronic hypoxic respiratory failure likely secondary to acute on chronic combined CHF/pleural effusion -He presented with significant edema and weight gain -Responding to IV Lasix 60mg  bid -He does not appear to be infected, no fever, no leukocytosis, urine strep pneumo antigen negative, will stop antibiotics, will follow up on sputum culture. -cardiology consulted, input appreciated  Permanent A. fib with slow ventricular rate status post pacemaker Continue Coumadin  NSVT:  on tele, does not appear symptomatic, management per cardiology  Aortic stenosis and pulmonary hypertension -Cardiology following  COPD home O2 dependent No wheezing  AK I on CKD 2 -Creatinine 1.2 on admission, improved, today is 0.8  History of BPH, chronic cystitis, suprapubic catheter (changed monthly) -Urine culture no growth, stop antibiotics  Chronic back pain, current pain  medication  Constipation; stool regimen  FTT: get PT, arrange home health  Code Status: full  Family Communication: patient and son at bedside  Disposition Plan: home with home health, need cardiology clearance   Consultants:  cardiology  Procedures:  none  Antibiotics:  As above   Objective: BP (!) 121/58 (BP Location: Right Arm)   Pulse 63   Temp 97.6 F (36.4 C) (Oral)   Resp 18   Ht 5\' 8"  (1.727 m)   Wt 82.4 kg (181 lb 11.2 oz)   SpO2 93%   BMI 27.63 kg/m   Intake/Output Summary (Last 24 hours) at 05/18/2017 0833 Last data filed at 05/18/2017 0340 Gross per 24 hour  Intake 720 ml  Output 4250 ml  Net -3530 ml   Filed Weights   05/16/17 0202 05/17/17 0504 05/18/17 0338  Weight: 88.6 kg (195 lb 5.2 oz) 84.2 kg (185 lb 11.2 oz) 82.4 kg (181 lb 11.2 oz)    Exam: Patient is examined daily including today on 05/18/2017, exams remain the same as of yesterday except that has changed    General:  NAD  Cardiovascular: paced rhythm  Respiratory: diminished at basis  Abdomen: Soft/ND/NT, positive BS  Musculoskeletal:srotal edema and bilateral lower extremity pitting Edema has much improved  Neuro: alert, oriented   Data Reviewed: Basic Metabolic Panel: Recent Labs  Lab 05/15/17 2249 05/17/17 0433 05/18/17 0429  NA 133* 136 136  K 4.7 4.0 3.9  CL 84* 81* 80*  CO2 40* 44* 45*  GLUCOSE 100* 103* 102*  BUN 35* 24* 20  CREATININE 1.20 0.86 0.80  CALCIUM 8.5* 8.7* 8.9  MG  --   --  1.7   Liver Function Tests: Recent Labs  Lab 05/15/17 2249 05/18/17 0429  AST 26 23  ALT 15* 14*  ALKPHOS 58 51  BILITOT 1.3* 0.8  PROT 6.3* 6.3*  ALBUMIN 3.5 3.2*   No results for input(s): LIPASE, AMYLASE in the last 168 hours. No results for input(s): AMMONIA in the last 168 hours. CBC: Recent Labs  Lab 05/15/17 2249 05/18/17 0429  WBC 6.9 7.5  NEUTROABS 5.0 5.5  HGB 12.4* 11.8*  HCT 41.7 40.7  MCV 94.1 95.8  PLT 136* 151   Cardiac Enzymes:     Recent Labs  Lab 05/16/17 0127 05/16/17 0500 05/16/17 1243  TROPONINI 0.05* 0.05* 0.04*   BNP (last 3 results) Recent Labs    02/08/17 1152 05/15/17 2249  BNP 233.5* 246.3*    ProBNP (last 3 results) No results for input(s): PROBNP in the last 8760 hours.  CBG: No results for input(s): GLUCAP in the last 168 hours.  Recent Results (from the past 240 hour(s))  Urine culture     Status: Abnormal   Collection Time: 05/15/17 11:52 PM  Result Value Ref Range Status   Specimen Description   Final    URINE, RANDOM Performed at [facility], [address] [place], [place] [address]    Special Requests   Final    NONE Performed at [facility], [address], [place], [place] [address]    Culture MULTIPLE SPECIES PRESENT, SUGGEST RECOLLECTION (A)  Final   Report Status 05/17/2017 FINAL  Final  Culture, sputum-assessment     Status: None   Collection Time: 05/17/17  8:49 AM  Result Value Ref Range Status   Specimen Description SPUTUM  Final   Special Requests Normal  Final   Sputum evaluation   Final    THIS SPECIMEN IS ACCEPTABLE FOR SPUTUM CULTURE Performed at [facility], [address] [place], [place] [address]    Report Status 05/17/2017 FINAL  Final  Culture, respiratory (NON-Expectorated)     Status: None (Preliminary result)   Collection Time: 05/17/17  8:49 AM  Result Value Ref Range Status   Specimen Description   Final    SPUTUM Performed at [facility], [address] [place], [place] [address]    Special Requests   Final    Normal Reflexed from [id number] Performed at [facility], [address] [place], [place] [address]    Gram Stain   Final    FEW WBC PRESENT, PREDOMINANTLY PMN RARE GRAM POSITIVE COCCI RARE GRAM POSITIVE RODS Performed at [facility], [address] [place], [place] [address]    Culture PENDING  Incomplete   Report Status  PENDING  Incomplete     Studies: No results found.  Scheduled Meds: . bisacodyl  10 mg Rectal Daily  . furosemide  60 mg Intravenous BID  . ipratropium-albuterol  3 mL Nebulization TID  . isosorbide mononitrate  30 mg Oral Daily  . lidocaine  1 patch Transdermal Once  . lidocaine  2 patch Transdermal Q24H  . pantoprazole  40 mg Oral Daily  . polyethylene glycol  17 g Oral Daily  . senna-docusate  1 tablet Oral BID  . sodium chloride flush  3 mL Intravenous Q12H  . Warfarin - Pharmacist Dosing Inpatient   Does not apply q1800    Continuous Infusions: . sodium chloride    . magnesium sulfate 1 - 4 g bolus IVPB  Time spent: I have personally reviewed and interpreted on  05/18/2017 daily labs, tele strips, imagings as discussed above under date review session and assessment and plans.  I reviewed all nursing notes, pharmacy notes, consultant notes,  vitals, pertinent old records  I have discussed plan of care as described above with RN , patient and family on 05/18/2017   Albertine Grates MD, PhD  [facility] Pager [phone]. If 7PM-7AM, please contact night-coverage at [email or web address], password [facility] 05/18/2017, 8:33 AM  LOS: 2 days

## 2017-05-18 NOTE — Progress Notes (Signed)
Physical Therapy Treatment Patient Details Name: Howard CombesWalter C Boehlke Jr. MRN: [id number] DOB: 05/29/1935 Today's Date: 05/18/2017    History of Present Illness 82 y.o. male with medical history significant of permanent atrial fibrillation, BPH, suprapubic catheter,  single chamber Med[employer]onic Adapta permanent pacemaker, COPD on 2L at baseline, stasis dermatitis, HLD, GERD, OSA, pulmonary hypertension. Patient presents with progressive sob and cough productive of thick sputum. Dx of CAP    PT Comments    Pt ambulated 100 feet total in hallway on 4L O2 [place] and SPO2 93%.    Follow Up Recommendations  Home health PT     Equipment Recommendations  None recommended by PT    Recommendations for Other Services       Precautions / Restrictions Precautions Precautions: Fall Precaution Comments: monitor O2 sats Restrictions Weight Bearing Restrictions: No    Mobility  Bed Mobility               General bed mobility comments: sitting on edge of bed  Transfers Overall transfer level: Modified independent                  Ambulation/Gait Ambulation/Gait assistance: Supervision Ambulation Distance (Feet): 50 Feet(x2) Assistive device: Rolling walker (2 wheeled) Gait Pattern/deviations: Step-through pattern;Decreased stride length;Trunk flexed     General Gait Details: verbal cues for RW positioning, tends to keep RW too far ahead, performed 50 feet x2 with standing rest break, Spo2 93% on 4L O2    Stairs            Wheelchair Mobility    Modified Rankin (Stroke Patients Only)       Balance                                            Cognition Arousal/Alertness: Awake/alert Behavior During Therapy: WFL for tasks assessed/performed Overall Cognitive Status: Within Functional Limits for tasks assessed                                        Exercises      General Comments        Pertinent Vitals/Pain Pain Assessment:  0-10 Pain Score: 3  Pain Location: back Pain Descriptors / Indicators: Aching Pain Intervention(s): Limited activity within patient's tolerance;Repositioned;Monitored during session    Home Living                      Prior Function            PT Goals (current goals can now be found in the care plan section) Progress towards PT goals: Progressing toward goals    Frequency    Min 3X/week      PT Plan Current plan remains appropriate    Co-evaluation              AM-PAC PT "6 Clicks" Daily Activity  Outcome Measure  Difficulty turning over in bed (including adjusting bedclothes, sheets and blankets)?: None Difficulty moving from lying on back to sitting on the side of the bed? : None Difficulty sitting down on and standing up from a chair with arms (e.g., wheelchair, bedside commode, etc,.)?: None Help needed moving to and from a bed to chair (including a wheelchair)?: A Little Help needed walking in hospital room?: A  Little Help needed climbing 3-5 steps with a railing? : A Little 6 Click Score: 21    End of Session Equipment Utilized During Treatment: Oxygen Activity Tolerance: Patient tolerated treatment well Patient left: in chair;with call bell/phone within reach;with family/visitor present   PT Visit Diagnosis: Difficulty in walking, not elsewhere classified (R26.2);History of falling (Z91.81)     Time: 1610-9604 PT Time Calculation (min) (ACUTE ONLY): 16 min  Charges:  $Gait Training: 8-22 mins                    G Codes:       Zenovia Jarred, PT, DPT 05/18/2017 Pager: [phone]  Maida Sale E 05/18/2017, 11:21 AM

## 2017-05-18 NOTE — Plan of Care (Signed)
  Problem: Health Behavior/Discharge Planning: Goal: Ability to manage health-related needs will improve Outcome: Progressing   Problem: Clinical Measurements: Goal: Ability to maintain clinical measurements within normal limits will improve Outcome: Progressing Goal: Will remain free from infection Outcome: Progressing Note:  IV/PO abx Goal: Diagnostic test results will improve Outcome: Progressing Goal: Respiratory complications will improve Outcome: Progressing Goal: Cardiovascular complication will be avoided Outcome: Progressing   Problem: Activity: Goal: Risk for activity intolerance will decrease Outcome: Progressing   Problem: Elimination: Goal: Will not experience complications related to bowel motility Outcome: Progressing   Problem: Pain Managment: Goal: General experience of comfort will improve Outcome: Progressing   Problem: Safety: Goal: Ability to remain free from injury will improve Outcome: Progressing   Problem: Education: Goal: Ability to demonstrate management of disease process will improve Outcome: Progressing Goal: Ability to verbalize understanding of medication therapies will improve Outcome: Progressing   Problem: Activity: Goal: Capacity to carry out activities will improve Outcome: Progressing   Problem: Cardiac: Goal: Ability to achieve and maintain adequate cardiopulmonary perfusion will improve Outcome: Progressing

## 2017-05-18 NOTE — Progress Notes (Signed)
Pt with 20 beat run VT with one paced beat while resting. Pt easily awakens, denies chest palpitations, SOB, dizziness. Dr Roda ShuttersXu notified. Will monitor for new orders.

## 2017-05-18 NOTE — Care Management Note (Signed)
Case Management Note  Patient Details  Name: Howard CombesWalter C Duguay Jr. MRN: [id number] Date of Birth: 01/08/1936  Subjective/Objective:    CHF, OSA                Action/Plan:Pt will discharge home with [facility] for CHF program and Home Health.   Expected Discharge Date:                  Expected Discharge Plan:  Home w Home Health Services  In-House Referral:     Discharge planning Services  CM Consult  Post Acute Care Choice:    Choice offered to:  Adult Children, Patient  DME Arranged:  Bedside commode DME Agency:  [facility]  HH Arranged:  [facility]  [facility]  Status of Service:  In process, will continue to follow  If discussed at Long Length of Stay Meetings, dates discussed:    Additional CommentsGeni Bers:  Howard Zane, RN 05/18/2017, 2:15 PM

## 2017-05-18 NOTE — Plan of Care (Signed)
  Problem: Health Behavior/Discharge Planning: Goal: Ability to manage health-related needs will improve Outcome: Progressing   Problem: Clinical Measurements: Goal: Ability to maintain clinical measurements within normal limits will improve Outcome: Progressing Goal: Will remain free from infection Outcome: Progressing Goal: Diagnostic test results will improve Outcome: Progressing Goal: Respiratory complications will improve Outcome: Progressing Goal: Cardiovascular complication will be avoided Outcome: Progressing   Problem: Activity: Goal: Risk for activity intolerance will decrease Outcome: Progressing   Problem: Elimination: Goal: Will not experience complications related to bowel motility Outcome: Progressing   Problem: Pain Managment: Goal: General experience of comfort will improve Outcome: Progressing   Problem: Safety: Goal: Ability to remain free from injury will improve Outcome: Progressing   Problem: Education: Goal: Ability to demonstrate management of disease process will improve Outcome: Progressing Goal: Ability to verbalize understanding of medication therapies will improve Outcome: Progressing   Problem: Activity: Goal: Capacity to carry out activities will improve Outcome: Progressing   Problem: Cardiac: Goal: Ability to achieve and maintain adequate cardiopulmonary perfusion will improve Outcome: Progressing

## 2017-05-19 ENCOUNTER — Inpatient Hospital Stay (HOSPITAL_COMMUNITY): Payer: Medicare Other

## 2017-05-19 ENCOUNTER — Telehealth: Payer: Self-pay | Admitting: Cardiovascular Disease

## 2017-05-19 LAB — PROTIME-INR
INR: 1.87
PROTHROMBIN TIME: 21.4 s — AB (ref 11.4–15.2)

## 2017-05-19 LAB — BASIC METABOLIC PANEL
Anion gap: 11 (ref 5–15)
BUN: 17 mg/dL (ref 6–20)
CALCIUM: 9.2 mg/dL (ref 8.9–10.3)
CO2: 48 mmol/L — ABNORMAL HIGH (ref 22–32)
Chloride: 79 mmol/L — ABNORMAL LOW (ref 101–111)
Creatinine, Ser: 0.81 mg/dL (ref 0.61–1.24)
GFR calc Af Amer: >60 mL/min (ref >60)
GLUCOSE: 108 mg/dL — AB (ref 65–99)
POTASSIUM: 3.9 mmol/L (ref 3.5–5.1)
Sodium: 138 mmol/L (ref 135–145)

## 2017-05-19 LAB — CULTURE, RESPIRATORY
CULTURE: NORMAL
SPECIAL REQUESTS: NORMAL

## 2017-05-19 LAB — MAGNESIUM: Magnesium: 1.8 mg/dL (ref 1.7–2.4)

## 2017-05-19 MED ORDER — IPRATROPIUM-ALBUTEROL 0.5-2.5 (3) MG/3ML IN SOLN: 3.0000 mL | RESPIRATORY_TRACT | Status: DC | PRN

## 2017-05-19 MED ORDER — GUAIFENESIN ER 600 MG PO TB12
600.0000 mg | ORAL_TABLET | Freq: Two times a day (BID) | ORAL | Status: DC
  Administered 2017-05-19 – 2017-05-20 (×2): 600 mg via ORAL
  Filled 2017-05-19 (×2): qty 1

## 2017-05-19 MED ORDER — FUROSEMIDE 40 MG PO TABS
40.0000 mg | ORAL_TABLET | Freq: Two times a day (BID) | ORAL | Status: DC
  Administered 2017-05-19 – 2017-05-20 (×3): 40 mg via ORAL
  Filled 2017-05-19 (×3): qty 1

## 2017-05-19 MED ORDER — FLUTICASONE PROPIONATE 50 MCG/ACT NA SUSP
2.0000 | Freq: Every day | NASAL | Status: DC
  Administered 2017-05-19 – 2017-05-20 (×2): 2 via NASAL
  Filled 2017-05-19: qty 16

## 2017-05-19 MED ORDER — METHYLPREDNISOLONE SODIUM SUCC 125 MG IJ SOLR
60.0000 mg | INTRAMUSCULAR | Status: DC
  Administered 2017-05-19: 60 mg via INTRAVENOUS
  Filled 2017-05-19: qty 2

## 2017-05-19 MED ORDER — WARFARIN SODIUM 4 MG PO TABS
8.0000 mg | ORAL_TABLET | Freq: Once | ORAL | Status: AC
  Administered 2017-05-19: 8 mg via ORAL
  Filled 2017-05-19: qty 2

## 2017-05-19 NOTE — Telephone Encounter (Signed)
New message     [facility]   D/C 05/20/17

## 2017-05-19 NOTE — Plan of Care (Signed)
  Problem: Health Behavior/Discharge Planning: Goal: Ability to manage health-related needs will improve Outcome: Progressing   Problem: Clinical Measurements: Goal: Ability to maintain clinical measurements within normal limits will improve Outcome: Progressing Goal: Will remain free from infection Outcome: Progressing Goal: Diagnostic test results will improve Outcome: Progressing Goal: Respiratory complications will improve Outcome: Progressing Goal: Cardiovascular complication will be avoided Outcome: Progressing   Problem: Pain Managment: Goal: General experience of comfort will improve Outcome: Progressing   Problem: Safety: Goal: Ability to remain free from injury will improve Outcome: Progressing   Problem: Education: Goal: Ability to demonstrate management of disease process will improve Outcome: Progressing Goal: Ability to verbalize understanding of medication therapies will improve Outcome: Progressing   Problem: Activity: Goal: Capacity to carry out activities will improve Outcome: Progressing   Problem: Cardiac: Goal: Ability to achieve and maintain adequate cardiopulmonary perfusion will improve Outcome: Progressing

## 2017-05-19 NOTE — Progress Notes (Addendum)
Progress Note  Patient Name: Howard CombesWalter C Nurse Jr. Date of Encounter: 05/19/2017  Primary Cardiologist: Howard FairMihai Elsye Mccollister, MD   Subjective   More SOB this AM and + productive cough.  Is still able to lie almost flat.  No chest pain -today with red macular papular rash on body. No pruritis.   Inpatient Medications    Scheduled Meds: . furosemide  40 mg Oral BID  . isosorbide mononitrate  30 mg Oral Daily  . lidocaine  2 patch Transdermal Q24H  . pantoprazole  40 mg Oral Daily  . polyethylene glycol  17 g Oral Daily  . senna-docusate  1 tablet Oral BID  . sodium chloride flush  3 mL Intravenous Q12H  . warfarin  8 mg Oral Once  . Warfarin - Pharmacist Dosing Inpatient   Does not apply q1800   Continuous Infusions: . sodium chloride     PRN Meds: sodium chloride, acetaminophen, albuterol, ALPRAZolam, ipratropium-albuterol, ondansetron (ZOFRAN) IV, oxyCODONE-acetaminophen **AND** oxyCODONE, sodium chloride flush   Vital Signs    Vitals:   05/18/17 1413 05/18/17 2207 05/19/17 0543 05/19/17 1020  BP: (!) 106/56 (!) 119/54 (!) 125/56 (!) 118/54  Pulse: 61 60 65 69  Resp: 20 20 18 20   Temp: 98.5 F (36.9 C) (!) 97.5 F (36.4 C) 97.8 F (36.6 C) 98.8 F (37.1 C)  TempSrc: Oral Axillary Oral Oral  SpO2: 96% 96% 90% 92%  Weight:   178 lb (80.7 kg)   Height:        Intake/Output Summary (Last 24 hours) at 05/19/2017 1039 Last data filed at 05/19/2017 0558 Gross per 24 hour  Intake 1060 ml  Output 2475 ml  Net -1415 ml   Filed Weights   05/17/17 0504 05/18/17 0338 05/19/17 0543  Weight: 185 lb 11.2 oz (84.2 kg) 181 lb 11.2 oz (82.4 kg) 178 lb (80.7 kg)    Telemetry    A fib with pacing - Personally Reviewed  ECG    No new - Personally Reviewed  Physical Exam   GEN: No acute distress.   Neck: + to jaw line JVD Cardiac: RRR, 3/6 systolic murmur, no rubs, or gallops.  Respiratory: bilateral breath sounds to auscultation bilaterally + rhonchi on rt base. Some  wheezes GI: Soft, nontender, non-distended  MS: 1+ to tr edema much improved ; No deformity. Neuro:  Nonfocal  Psych: Normal affect  Skin: macular papular red rash that blanches allover body. Began this AM no pruritis.   Labs    Chemistry Recent Labs  Lab 05/15/17 2249 05/17/17 0433 05/18/17 0429 05/19/17 0417  NA 133* 136 136 138  K 4.7 4.0 3.9 3.9  CL 84* 81* 80* 79*  CO2 40* 44* 45* 48*  GLUCOSE 100* 103* 102* 108*  BUN 35* 24* 20 17  CREATININE 1.20 0.86 0.80 0.81  CALCIUM 8.5* 8.7* 8.9 9.2  PROT 6.3*  --  6.3*  --   ALBUMIN 3.5  --  3.2*  --   AST 26  --  23  --   ALT 15*  --  14*  --   ALKPHOS 58  --  51  --   BILITOT 1.3*  --  0.8  --   GFRNONAA 56* >60 >60 >60  GFRAA >60 >60 >60 >60  ANIONGAP 9 11 11 11      Hematology Recent Labs  Lab 05/15/17 2249 05/18/17 0429  WBC 6.9 7.5  RBC 4.43 4.25  HGB 12.4* 11.8*  HCT 41.7 40.7  MCV  94.1 95.8  MCH 28.0 27.8  MCHC 29.7* 29.0*  RDW 15.1 15.1  PLT 136* 151    Cardiac Enzymes Recent Labs  Lab 05/16/17 0127 05/16/17 0500 05/16/17 1243  TROPONINI 0.05* 0.05* 0.04*    Recent Labs  Lab 05/15/17 2256  TROPIPOC 0.02     BNP Recent Labs  Lab 05/15/17 2249  BNP 246.3*     DDimer No results for input(s): DDIMER in the last 168 hours.   Radiology    No results found.  Cardiac Studies   05/16/17 Echo Study Conclusions  - Left ventricle: Flattening of the septum consistent with   pulmonary HTN. The cavity size was normal. Wall thickness was   normal. Systolic function was normal. The estimated ejection   fraction was in the range of 50% to 55%. The study is not   technically sufficient to allow evaluation of LV diastolic   function. - Aortic valve: There was moderate stenosis. Valve area (VTI): 0.89   cm^2. Valve area (Vmax): 0.96 cm^2. Valve area (Vmean): 0.84   cm^2. - Mitral valve: Nodular calcification of the mitral leaflet tips.   There was mild regurgitation. - Left atrium: The  atrium was moderately dilated. - Right ventricle: The cavity size was moderately dilated. - Right atrium: The atrium was severely dilated. - Atrial septum: No defect or patent foramen ovale was identified. - Pulmonary arteries: PA peak pressure: 62 mm Hg (S).   Patient Profile     82 y.o. male with a hx of permanent a fib, on coumadin, aortic stenosis, pulmonary HTN, pacemaker, MDT adapta, for bradycardia, angina improved with nitrates, chronic suprapubic cath.  Now admitted with increased edema for diuresis.  He does continue to smoke.   Assessment & Plan    Acute on chronic diastolic HF changed to po lasix today --neg. 8,065 since admit and wt down to 178 lbs.  Loss of 17 lbs --Cr stable at 0.81   --plan to observe for 24 hours, plan d/c for tomorrow on current dose of po lasix.  Dry wt is 180 depending on respirations --increased SOB today + productive cough? Repeat CXR  --if does go home tomorrow has follow up TOC appt in discharge  Permanent a fib and PPM --stable   Aortic stenosis moderate  Pulmonary HTN  with PA pk pressure 62 mmHg   Chronic supra pubic catheter per IM.  Rash over body, no pruritis     For questions or updates, please contact [facility] Please consult [email or web address] for contact info under Cardiology/STEMI.      Signed, Nada Boozer, NP  05/19/2017, 10:39 AM    I have seen and examined the patient along with Nada Boozer, NP.  I have reviewed the chart, notes and new data.  I agree with PA/NP's note.  Key new complaints: His edema is all gone,, but he complains of dyspnea.  It is not positional.  His biggest complaint is cough productive of very tenacious sputum.  He has a developed a nonpruritic rash covering his torso and extremities. Key examination changes: He has a generalized macular rash with a measles-like appearance, nonpruritic Key new findings / data: Still has a pretty normal creatinine at 0.81 despite being roughly 10 pounds beneath  his previously established "dry weight"  PLAN: If he maintains current volume status and does not begin gaining fluids over the next 24 hours with discharge home on the current dose of diuretics.  Would reestablish his "dry weight" at 173 pounds.  He needs to weigh himself daily and call our office promptly if his weight increases to 180 pounds. I am not sure what caused the rash, but it has the appearance of a drug reaction.  It may be from ceftriaxone, although he has received this antibiotic before in 2014. Will make arrangements for an early transition of care appointment in couple of weeks.  Howard Fair, MD, [facility] [phone] 05/19/2017, 4:08 PM

## 2017-05-19 NOTE — Telephone Encounter (Signed)
Pt currently admitted.

## 2017-05-19 NOTE — Care Management Important Message (Signed)
Important Message  Patient Details  Name: Howard CombesWalter C Avitabile Jr. MRN: [id number] Date of Birth: 09/18/1935   Medicare Important Message Given:  Yes    Caren MacadamFuller, Erskin Zinda 05/19/2017, 11:31 AMImportant Message  Patient Details  Name: Howard CombesWalter C Melberg Jr. MRN: [id number] Date of Birth: 07/06/1935   Medicare Important Message Given:  Yes    Caren MacadamFuller, Elmond Poehlman 05/19/2017, 11:31 AM

## 2017-05-19 NOTE — Progress Notes (Signed)
ANTICOAGULATION CONSULT NOTE  Pharmacy Consult for Warfarin Indication: atrial fibrillation  No Known Allergies  Height: 5\' 8"  (172.7 cm) Weight: 178 lb (80.7 kg) IBW/kg (Calculated) : 68.4  Vital Signs: Temp: 97.8 F (36.6 C) (04/05 0543) Temp Source: Oral (04/05 0543) BP: 125/56 (04/05 0543) Pulse Rate: 65 (04/05 0543)  Labs: Recent Labs    05/16/17 1243 05/17/17 0433 05/18/17 0429 05/19/17 0417  HGB  --   --  11.8*  --   HCT  --   --  40.7  --   PLT  --   --  151  --   LABPROT  --  18.6* 20.5* 21.4*  INR  --  1.57 1.78 1.87  CREATININE  --  0.86 0.80 0.81  TROPONINI 0.04*  --   --   --    Estimated Creatinine Clearance: 71.5 mL/min (by C-G formula based on SCr of 0.81 mg/dL).  Assessment: 81 yoM on chronic warfarin for A-fib. HD 7.5 mg M/W/F and 5 mg other days.  LD 4/1 at 1500  INR=1.65 on admission  05/19/2017 INR 1.87 - below goal after 7.5, 7.5, 8 mg Warfarin Hgb 11.8, Platelets 151 (4/4)  No bleeding reported.  Po intake improving  Goal of Therapy:  INR 2-3   Plan:  Warfarin 8mg  today at 1200 Daily INR  Otho BellowsGreen, Jerritt Cardoza L PharmD Pager [phone] 05/19/2017, 8:28 AM

## 2017-05-19 NOTE — Progress Notes (Signed)
PROGRESS NOTE  Jacqualin CombesWalter C Raschke Jr. ZOX:[id number] DOB: 08/12/1935 DOA: 05/15/2017 PCP: Aida PufferLittle, James, MD  HPI/Recap of past 24 hours:   Urine output 2.4 liters last 24hrs Breathing is better,  Edema almost resolved, has occasionally congested cough,  on 2liter oxygen ( baseline on 2liter),   no fever, denies chest pain. Report lidocaine patch helped back pain,   bmx1   Son at bed side, son reports new onset of rash on patient's flank that started this am, patient denies itching, no buring  Assessment/Plan: Active Problems:   Hypercholesterolemia   Tobacco use disorder   HYPERTENSION, BENIGN SYSTEMIC   CAD (coronary artery disease)   Atrial fibrillation with slow ventricular response (HCC)   Hyperplasia of prostate   OSA (obstructive sleep apnea)   COPD GOLD 0   Acute on chronic combined systolic and diastolic CHF (congestive heart failure) (HCC)   Pulmonary hypertension (HCC)   CAP (community acquired pneumonia)   Chronic respiratory failure with hypoxia (HCC)   Pleural effusion   Fluid overload   CHF (congestive heart failure), NYHA class II, acute, diastolic (HCC)   Chronic cystitis  Acute on chronic hypoxic respiratory failure likely secondary to acute on chronic combined CHF/pleural effusion -He presented with significant edema and weight gain -Responding to IV Lasix 60mg  bid, transition to oral today per cardiology recommendation -He does not appear to be infected, no fever, no leukocytosis, urine strep pneumo antigen negative, will stop antibiotics, sputum culture "Consistent with normal respiratory flora. " -cardiology consulted, input appreciated  Permanent A. fib with slow ventricular rate status post pacemaker Continue Coumadin  NSVT:  on tele, does not appear symptomatic, management per cardiology  Aortic stenosis and pulmonary hypertension -Cardiology following  COPD home O2 dependent No wheezing  AK I on CKD 2 -Creatinine 1.2 on admission,  improved, today is 0.8  History of BPH, chronic cystitis, suprapubic catheter (changed monthly) -Urine culture no growth, stop antibiotics  Chronic back pain, current pain medication  Constipation; stool regimen  FTT: get PT, arrange home health  Miliary skin rash , bilateral flank, unclear etiology Solumedrol x1 today  Code Status: full  Family Communication: patient and son at bedside  Disposition Plan: home with home health, likely on 4/6, need cardiology clearance   Consultants:  cardiology  Procedures:  none  Antibiotics:  As above   Objective: BP (!) 111/56 (BP Location: Right Arm)   Pulse 74   Temp 98.6 F (37 C) (Axillary)   Resp (!) 21   Ht 5\' 8"  (1.727 m)   Wt 80.7 kg (178 lb)   SpO2 93%   BMI 27.06 kg/m   Intake/Output Summary (Last 24 hours) at 05/19/2017 1724 Last data filed at 05/19/2017 1301 Gross per 24 hour  Intake 1080 ml  Output 2675 ml  Net -1595 ml   Filed Weights   05/17/17 0504 05/18/17 0338 05/19/17 0543  Weight: 84.2 kg (185 lb 11.2 oz) 82.4 kg (181 lb 11.2 oz) 80.7 kg (178 lb)    Exam: Patient is examined daily including today on 05/19/2017, exams remain the same as of yesterday except that has changed    General:  NAD  Cardiovascular: paced rhythm  Respiratory: diminished at basis  Abdomen: Soft/ND/NT, positive BS  Musculoskeletal:srotal edema and bilateral lower extremity pitting Edema has much improved, almost resolved  Neuro: alert, oriented   Skin: skin rash bilateral flank  Data Reviewed: Basic Metabolic Panel: Recent Labs  Lab 05/15/17 2249 05/17/17 0433 05/18/17 0429 05/19/17  0417  NA 133* 136 136 138  K 4.7 4.0 3.9 3.9  CL 84* 81* 80* 79*  CO2 40* 44* 45* 48*  GLUCOSE 100* 103* 102* 108*  BUN 35* 24* 20 17  CREATININE 1.20 0.86 0.80 0.81  CALCIUM 8.5* 8.7* 8.9 9.2  MG  --   --  1.7 1.8   Liver Function Tests: Recent Labs  Lab 05/15/17 2249 05/18/17 0429  AST 26 23  ALT 15* 14*  ALKPHOS 58  51  BILITOT 1.3* 0.8  PROT 6.3* 6.3*  ALBUMIN 3.5 3.2*   No results for input(s): LIPASE, AMYLASE in the last 168 hours. No results for input(s): AMMONIA in the last 168 hours. CBC: Recent Labs  Lab 05/15/17 2249 05/18/17 0429  WBC 6.9 7.5  NEUTROABS 5.0 5.5  HGB 12.4* 11.8*  HCT 41.7 40.7  MCV 94.1 95.8  PLT 136* 151   Cardiac Enzymes:   Recent Labs  Lab 05/16/17 0127 05/16/17 0500 05/16/17 1243  TROPONINI 0.05* 0.05* 0.04*   BNP (last 3 results) Recent Labs    02/08/17 1152 05/15/17 2249  BNP 233.5* 246.3*    ProBNP (last 3 results) No results for input(s): PROBNP in the last 8760 hours.  CBG: No results for input(s): GLUCAP in the last 168 hours.  Recent Results (from the past 240 hour(s))  Urine culture     Status: Abnormal   Collection Time: 05/15/17 11:52 PM  Result Value Ref Range Status   Specimen Description   Final    URINE, RANDOM Performed at [facility], [address] [place], [place] [address]    Special Requests   Final    NONE Performed at [facility], [address] [place], [place] [address]    Culture MULTIPLE SPECIES PRESENT, SUGGEST RECOLLECTION (A)  Final   Report Status 05/17/2017 FINAL  Final  Culture, sputum-assessment     Status: None   Collection Time: 05/17/17  8:49 AM  Result Value Ref Range Status   Specimen Description SPUTUM  Final   Special Requests Normal  Final   Sputum evaluation   Final    THIS SPECIMEN IS ACCEPTABLE FOR SPUTUM CULTURE Performed at [facility], [address], [place], [place] [address]    Report Status 05/17/2017 FINAL  Final  Culture, respiratory (NON-Expectorated)     Status: None   Collection Time: 05/17/17  8:49 AM  Result Value Ref Range Status   Specimen Description   Final    SPUTUM Performed at [facility], [address] [place], [place] [address]    Special Requests   Final    Normal Reflexed from  [id number] Performed at [facility], [address] [place], [place] [address]    Gram Stain   Final    FEW WBC PRESENT, PREDOMINANTLY PMN RARE GRAM POSITIVE COCCI RARE GRAM POSITIVE RODS    Culture   Final    Consistent with normal respiratory flora. Performed at [facility], [address] [place], [place] [address]    Report Status 05/19/2017 FINAL  Final     Studies: No results found.  Scheduled Meds: . furosemide  40 mg Oral BID  . isosorbide mononitrate  30 mg Oral Daily  . lidocaine  2 patch Transdermal Q24H  . pantoprazole  40 mg Oral Daily  . polyethylene glycol  17 g Oral Daily  . senna-docusate  1 tablet Oral BID  . sodium chloride  flush  3 mL Intravenous Q12H  . Warfarin - Pharmacist Dosing Inpatient   Does not apply q1800    Continuous Infusions: . sodium chloride       Time spent: I have personally reviewed and interpreted on  05/19/2017 daily labs, tele strips, imagings as discussed above under date review session and assessment and plans.  I reviewed all nursing notes, pharmacy notes, consultant notes,  vitals, pertinent old records  I have discussed plan of care as described above with RN , patient and family on 05/19/2017   Albertine Grates MD, PhD  [facility] Pager [phone]. If 7PM-7AM, please contact night-coverage at [email or web address], password [facility] 05/19/2017, 5:24 PM  LOS: 3 days

## 2017-05-19 NOTE — Progress Notes (Signed)
Occupational Therapy Treatment Patient Details Name: Howard Torres. MRN: [id number] DOB: [birth year] Today's Date: 05/19/2017    History of present illness 82 y.o. male with medical history significant of permanent atrial fibrillation, BPH, suprapubic catheter,  single chamber [employer] Adapta permanent pacemaker, COPD on 2L at baseline, stasis dermatitis, HLD, GERD, OSA, pulmonary hypertension. Patient presents with progressive sob and cough productive of thick sputum. Dx of CAP   OT comments  Pt willing to get up and work with OT; states he doesn't feel good today. Requested walking after working on LB dressing.  Follow Up Recommendations  No OT follow up    Equipment Recommendations  None recommended by OT    Recommendations for Other Services      Precautions / Restrictions Precautions Precautions: Fall Precaution Comments: monitor O2 sats       Mobility Bed Mobility Overal bed mobility: Modified Independent                Transfers   Equipment used: Rolling walker (2 wheeled)   Sit to Stand: Supervision              Balance                                           ADL either performed or assessed with clinical judgement   ADL                       Lower Body Dressing: Minimal assistance;Sit to/from stand;With adaptive equipment                 General ADL Comments: used wide sock aide and reacher for socks.  Pt prefers long reacher (issued both) so he can manage covers.  (pt has medicaid as 2* insurance).  Pt requested taking a walk to help him loosen phlegm--min guard for safety     Vision       Perception     Praxis      Cognition Arousal/Alertness: Awake/alert Behavior During Therapy: WFL for tasks assessed/performed Overall Cognitive Status: Within Functional Limits for tasks assessed                                          Exercises     Shoulder Instructions       General  Comments      Pertinent Vitals/ Pain       Pain Assessment: 0-10 Pain Score: 2  Pain Location: back; just doesn't feel good today Pain Descriptors / Indicators: Sore Pain Intervention(s): Limited activity within patient's tolerance;Monitored during session;Repositioned  Home Living                                          Prior Functioning/Environment              Frequency           Progress Toward Goals  OT Goals(current goals can now be found in the care plan section)  Progress towards OT goals: Progressing toward goals     Plan      Co-evaluation  AM-PAC PT "6 Clicks" Daily Activity     Outcome Measure   Help from another person eating meals?: None Help from another person taking care of personal grooming?: A Little Help from another person toileting, which includes using toliet, bedpan, or urinal?: A Little Help from another person bathing (including washing, rinsing, drying)?: A Little Help from another person to put on and taking off regular upper body clothing?: A Little Help from another person to put on and taking off regular lower body clothing?: A Little 6 Click Score: 19    End of Session    OT Visit Diagnosis: Unsteadiness on feet (R26.81);Muscle weakness (generalized) (M62.81)   Activity Tolerance Patient limited by fatigue   Patient Left in bed;with call bell/phone within reach;with family/visitor present   Nurse Communication          Time: [id number] OT Time Calculation (min): 18 min  Charges: OT General Charges $OT Visit: 1 Visit OT Treatments $Self Care/Home Management : 8-22 mins  Marica OtterMaryellen Dairon Procter, OTR/L [phone] 05/19/2017   Gabino Hagin 05/19/2017, 3:21 PM

## 2017-05-20 ENCOUNTER — Inpatient Hospital Stay (HOSPITAL_COMMUNITY): Payer: Medicare Other

## 2017-05-20 LAB — BASIC METABOLIC PANEL
ANION GAP: 10 (ref 5–15)
BUN: 19 mg/dL (ref 6–20)
CO2: 47 mmol/L — ABNORMAL HIGH (ref 22–32)
Calcium: 9.1 mg/dL (ref 8.9–10.3)
Chloride: 74 mmol/L — ABNORMAL LOW (ref 101–111)
Creatinine, Ser: 0.84 mg/dL (ref 0.61–1.24)
GFR calc Af Amer: >60 mL/min (ref >60)
Glucose, Bld: 154 mg/dL — ABNORMAL HIGH (ref 65–99)
Potassium: 5.1 mmol/L (ref 3.5–5.1)
SODIUM: 131 mmol/L — AB (ref 135–145)

## 2017-05-20 LAB — MAGNESIUM: MAGNESIUM: 1.8 mg/dL (ref 1.7–2.4)

## 2017-05-20 LAB — GLUCOSE, CAPILLARY: Glucose-Capillary: 151 mg/dL — ABNORMAL HIGH (ref 65–99)

## 2017-05-20 LAB — PROTIME-INR
INR: 2.42
Prothrombin Time: 26.1 seconds — ABNORMAL HIGH (ref 11.4–15.2)

## 2017-05-20 MED ORDER — LIDOCAINE 5 % EX PTCH: 2.0000 | MEDICATED_PATCH | CUTANEOUS | 0 refills | Status: AC

## 2017-05-20 MED ORDER — MAGNESIUM HYDROXIDE 400 MG/5ML PO SUSP
30.0000 mL | Freq: Two times a day (BID) | ORAL | Status: DC
  Administered 2017-05-20: 30 mL via ORAL
  Filled 2017-05-20: qty 30

## 2017-05-20 MED ORDER — GUAIFENESIN ER 600 MG PO TB12: 600.0000 mg | ORAL_TABLET | Freq: Two times a day (BID) | ORAL | 0 refills | Status: AC

## 2017-05-20 MED ORDER — WARFARIN SODIUM 5 MG PO TABS: 5.0000 mg | ORAL_TABLET | Freq: Once | ORAL | Status: DC

## 2017-05-20 MED ORDER — MATTRESS COVER MISC: 0 refills | Status: AC

## 2017-05-20 MED ORDER — POTASSIUM CHLORIDE CRYS ER 20 MEQ PO TBCR: 20.0000 meq | EXTENDED_RELEASE_TABLET | ORAL | 0 refills | Status: AC

## 2017-05-20 MED ORDER — PREDNISONE 10 MG PO TABS: ORAL_TABLET | ORAL | 0 refills | Status: DC

## 2017-05-20 MED ORDER — FLUTICASONE PROPIONATE 50 MCG/ACT NA SUSP: 2.0000 | Freq: Every day | NASAL | 0 refills | Status: AC

## 2017-05-20 MED ORDER — MAGNESIUM HYDROXIDE 400 MG/5ML PO SUSP: 30.0000 mL | Freq: Every day | ORAL | 0 refills | Status: AC | PRN

## 2017-05-20 MED ORDER — ALBUTEROL SULFATE (2.5 MG/3ML) 0.083% IN NEBU: 2.5000 mg | INHALATION_SOLUTION | RESPIRATORY_TRACT | Status: DC | PRN

## 2017-05-20 NOTE — Discharge Summary (Signed)
Discharge Summary  Howard Torres. ZOX:[id number] DOB: 07-05-1935  PCP: Aida Puffer, MD  Admit date: 05/15/2017 Discharge date: 05/20/2017  Time spent: , more than 50% of time spent on coordination of care.  Recommendations for Outpatient Follow-up:  1. F/u with PMD within a week  for hospital discharge follow up, repeat cbc/bmp at follow up. 2. F/u with coumadin clinic on Monday 3. F/u with cardiology ( cardiology to monitor right sided pleural effusion, discussed with Dr Johney Frame)  4. Home health RN/PT  Discharge Diagnoses:  Active Hospital Problems   Diagnosis Date Noted  . CAP (community acquired pneumonia) 05/16/2017  . Chronic respiratory failure with hypoxia (HCC) 05/16/2017  . Pleural effusion 05/16/2017  . Fluid overload 05/16/2017  . CHF (congestive heart failure), NYHA class II, acute, diastolic (HCC) 05/16/2017  . Chronic cystitis 05/16/2017  . Pulmonary hypertension (HCC) 02/11/2017  . Acute on chronic combined systolic and diastolic CHF (congestive heart failure) (HCC) 02/08/2017  . COPD GOLD 0 11/11/2014  . OSA (obstructive sleep apnea) 06/25/2008  . Atrial fibrillation with slow ventricular response (HCC) 06/12/2008  . Hypercholesterolemia 06/10/2008  . CAD (coronary artery disease) 06/10/2008  . Tobacco use disorder 04/13/2006  . HYPERTENSION, BENIGN SYSTEMIC 04/13/2006  . Hyperplasia of prostate 04/13/2006    Resolved Hospital Problems  No resolved problems to display.    Discharge Condition: stable  Diet recommendation: heart healthy  Filed Weights   05/18/17 0338 05/19/17 0543 05/20/17 0534  Weight: 82.4 kg (181 lb 11.2 oz) 80.7 kg (178 lb) 82.2 kg (181 lb 3.5 oz)    History of present illness: (per admitting MD Dr Adela Glimpse) PCP: Aida Puffer, MD   Outpatient Specialists:   CARDS:  Dr. Sherlon Handing, [facility] urology :   Dr.  Cassell Smiles   Patient arrived to ER on 05/15/17 at 2048  Patient coming from:    home Lives   With family     Chief Complaint:     Chief Complaint  Patient presents with  . Urinary Retention    HPI: Howard Torres. is a 82 y.o. male with medical history significant of permanent atrial fibrillation, BPH, single chamber [employer] Adapta permanent pacemaker, COPD on 2L at baseline, stasis dermatitis, HLD, GERD, OSA, pulmonary hypertension   Presented with   diminished output of suprapubic catheter since yesterday patient have had exchange done by urology with some difficulty and reports abdominal pain. Denies chest pain has cough productive of thick sputum creamy colored. Continues to smoke, reports occasional wheezing no fever. Denies alcohol abuse.  Reports pain in the groin and lower abdomen with progressive bilateral lower extremity and groin edema has been progressive the past few months.  Patient did endorse occasional shortness of breath and cough.  He noticed that his oxygen saturation at its home was down from his baseline.  Last admission for what appeared to be fluid overload possible acute on chronic CHF exacerbation was in December 2018 at that point he tolerated diuresis and improved he had similar presentation in the beginning.  Regarding pertinent Chronic problems: As per cardiology note last coronary artery evaluation was in 2010 and was normal patient has not followed up to have further evaluation Patient on Coumadin for history of atrial fibrillation.  Last echogram was in 2018 showing EF 50-55% moderately dilated left atrium.  PA peak pressure 55 mmHg.  While in ER: Was noted to be hypoxic when attempted to ambulate down to mid 80s     Hospital Course:  Active Problems:  Hypercholesterolemia   Tobacco use disorder   HYPERTENSION, BENIGN SYSTEMIC   CAD (coronary artery disease)   Atrial fibrillation with slow ventricular response (HCC)   Hyperplasia of prostate   OSA (obstructive sleep apnea)   COPD GOLD 0   Acute on chronic combined systolic and diastolic  CHF (congestive heart failure) (HCC)   Pulmonary hypertension (HCC)   CAP (community acquired pneumonia)   Chronic respiratory failure with hypoxia (HCC)   Pleural effusion   Fluid overload   CHF (congestive heart failure), NYHA class II, acute, diastolic (HCC)   Chronic cystitis   Acute on chronic hypoxic respiratory failure likely secondary to acute on chronic combined CHF/ right side pleural effusion -He presented with significant edema and weight gain -Responding to IV Lasix 60mg  bid, negative 8.5liter at discharge.  transition to oral  Lasix 40mg  bid per cardiology recommendation -He does not appear to be infected, no fever, no leukocytosis, urine strep pneumo antigen negative, will stop antibiotics, sputum culture "Consistent with normal respiratory flora. " -cardiology consulted, input appreciated  Right side pleural effusion -review chest ct from 01/2017 he has right side pleural effusion -repeat ct chest here right sided pleural effusion progressed, however, patient is asymptomatic from it, no chest pain, no sob, no fever -case discussed with cardiology Dr Johney Frame , patient will close follow up with cardiology to monitor right sided pleural effusion  Permanent A. fib with slow ventricular rate status post pacemaker Continue Coumadin  NSVT:  on tele, does not appear symptomatic, management per cardiology  Aortic stenosis and pulmonary hypertension -Cardiology following  COPD home O2 dependent No wheezing  AK I on CKD 2 -Creatinine 1.2 on admission, improved, has been around 0.8 the last three days  History of BPH, chronic cystitis, suprapubic catheter (changed monthly) -Urine culture no growth, stop antibiotics  Chronic back pain, current pain medication, he report lidocaine patch help, he requested prescription at discharge.  Constipation; stool regimen  FTT: get PT, arrange home health RN/PT  Miliary skin rash , bilateral flank, no itching, no buring,  unclear etiology Improved on Solumedrol   D/c on prednisone taper   Code Status: full  Family Communication: patient and son at bedside  Disposition Plan: home with home health with cardiology clearance   Consultants:  cardiology  Procedures:  none  Antibiotics:  As above   Discharge Exam: BP (!) 128/57 (BP Location: Right Arm)   Pulse 61   Temp (!) 97.5 F (36.4 C) (Axillary)   Resp 18   Ht 5\' 8"  (1.727 m)   Wt 82.2 kg (181 lb 3.5 oz)   SpO2 92%   BMI 27.55 kg/m   General: frail, NAD, very sharp mind Cardiovascular: paced rhythm Respiratory: diminished right base, no wheezing, no rhonchi Abdomen: Soft/ND/NT, positive BS Musculoskeletal:srotal edema and bilateral lower extremity pitting Edema has resolved Neuro: alert, oriented  Skin: skin rash bilateral flank improved     Discharge Instructions You were cared for by a hospitalist during your hospital stay. If you have any questions about your discharge medications or the care you received while you were in the hospital after you are discharged, you can call the unit and asked to speak with the hospitalist on call if the hospitalist that took care of you is not available. Once you are discharged, your primary care physician will handle any further medical issues. Please note that NO REFILLS for any discharge medications will be authorized once you are discharged, as it is imperative that you  return to your primary care physician (or establish a relationship with a primary care physician if you do not have one) for your aftercare needs so that they can reassess your need for medications and monitor your lab values.  Discharge Instructions    Diet - low sodium heart healthy   Complete by:  As directed    Increase activity slowly   Complete by:  As directed      Allergies as of 05/20/2017   No Known Allergies     Medication List    STOP taking these medications   losartan 25 MG tablet Commonly known  as:  COZAAR     TAKE these medications   albuterol 108 (90 Base) MCG/ACT inhaler Commonly known as:  PROVENTIL HFA;VENTOLIN HFA Inhale 2 puffs into the lungs every 6 (six) hours as needed for wheezing or shortness of breath.   ALPRAZolam 1 MG tablet Commonly known as:  XANAX Take 0.5-1 mg by mouth daily as needed for anxiety.   cyanocobalamin 1000 MCG/ML injection Commonly known as:  (VITAMIN B-12) Inject 1,000 mcg into the muscle every 30 (thirty) days.   fluticasone 50 MCG/ACT nasal spray Commonly known as:  FLONASE Place 2 sprays into both nostrils daily. Start taking on:  05/21/2017   furosemide 40 MG tablet Commonly known as:  LASIX Take 40 mg by mouth 2 (two) times daily. What changed:  Another medication with the same name was removed. Continue taking this medication, and follow the directions you see here.   guaiFENesin 600 MG 12 hr tablet Commonly known as:  MUCINEX Take 1 tablet (600 mg total) by mouth 2 (two) times daily.   isosorbide mononitrate 30 MG 24 hr tablet Commonly known as:  IMDUR Take 1 tablet (30 mg total) by mouth daily.   lidocaine 5 % Commonly known as:  LIDODERM Place 2 patches onto the skin daily. Remove & Discard patch within 12 hours or as directed by MD   magnesium hydroxide 400 MG/5ML suspension Commonly known as:  MILK OF MAGNESIA Take 30 mLs by mouth daily as needed for mild constipation.   nitroGLYCERIN 0.4 MG SL tablet Commonly known as:  NITROSTAT Place 1 tablet (0.4 mg total) under the tongue every 5 (five) minutes as needed for chest pain.   omeprazole 20 MG capsule Commonly known as:  PRILOSEC Take 20 mg by mouth daily.   oxyCODONE-acetaminophen 10-325 MG tablet Commonly known as:  PERCOCET Take 1 tablet by mouth every 6 (six) hours as needed for pain.   potassium chloride SA 20 MEQ tablet Commonly known as:  K-DUR,KLOR-CON Take 1 tablet (20 mEq total) by mouth every Monday, Wednesday, and Friday. Start taking on:   05/22/2017 What changed:  when to take this   predniSONE 10 MG tablet Commonly known as:  DELTASONE Label  & dispense according to the schedule below. 6 Pills PO on day one then, 5 Pills PO on day two, 4 Pills PO on day three, 3Pills PO on day four, 2 Pills PO on day five, 1 Pills PO on day six,  then STOP.  Total of 21 tabs   sodium chloride 0.65 % nasal spray Commonly known as:  OCEAN Place 1 spray into the nose as needed for congestion.   Vitamin D (Ergocalciferol) 50000 units Caps capsule Commonly known as:  DRISDOL Take 50,000 Units by mouth every 7 (seven) days. Wednesdays.   warfarin 5 MG tablet Commonly known as:  COUMADIN Take as directed. If you are unsure  how to take this medication, talk to your nurse or doctor. Original instructions:  TAKE 1 TO 1 AND 1/2 TABLETS BY MOUTH DAILY AS DIRECTED BY COUMADIN CLINIC What changed:  See the new instructions.            Durable Medical Equipment  (From admission, onward)        Start     Ordered   05/18/17 1330  For home use only DME Bedside commode  Once    Question:  Patient needs a bedside commode to treat with the following condition  Answer:  Fear for personal safety   05/18/17 1331     No Known Allergies Follow-up Information    [facility], Mihai, MD Follow up on 05/29/2017.   Specialty:  Cardiology Why:  at 9:30 AM with his NP, Joni ReiningKathryn Lawrence, DNP Contact information: [address] [place] [address] [phone]        Aida PufferLittle, James, MD Follow up in 1 week(s).   Specialty:  Family Medicine Why:  hospital discharge follow up. repeat cbc/bmp at hospital discharge follow up. Contact information: [address] [place] [address] [phone]            The results of significant diagnostics from this hospitalization (including imaging, microbiology, ancillary and laboratory) are listed below for reference.    Significant Diagnostic Studies: Ct Abdomen Pelvis Wo Contrast  Result  Date: 05/15/2017 CLINICAL DATA:  82 year old male with flank pain. Concern for kidney stone. EXAM: CT ABDOMEN AND PELVIS WITHOUT CONTRAST TECHNIQUE: Multidetector CT imaging of the abdomen and pelvis was performed following the standard protocol without IV contrast. COMPARISON:  Abdominal CT dated 02/08/2017 FINDINGS: Evaluation of this exam is limited in the absence of intravenous contrast. Lower chest: Partially visualized small right pleural effusion with associated partial compressive atelectasis of the right lower lobe. Superimposed pneumonia is not excluded. Clinical correlation is recommended. A 12 x 7 x 7 cm ovoid fluid attenuating structure at the right costophrenic angle likely represents a loculated component of the pleural effusion. Left lung base linear atelectasis/scarring. There is mild cardiomegaly. Multi vessel coronary vascular calcification and cardiac pacemaker wires noted. There is no intra-abdominal free air.  Small ascites. Hepatobiliary: There is morphologic changes of cirrhosis. There are multiple stones in the gallbladder. Pancreas: Unremarkable. No pancreatic ductal dilatation or surrounding inflammatory changes. Spleen: Normal in size without focal abnormality. Adrenals/Urinary Tract: The adrenal glands are unremarkable. Small right renal exophytic hypodense lesions are not characterized on this unenhanced CT. Ultrasound may provide better characterization. There is no hydronephrosis or nephrolithiasis on either side. The visualized ureters are grossly unremarkable. The urinary bladder is decompressed around a suprapubic catheter. There is diffuse thickening of the bladder wall which may be related to chronic infection. Correlation with urinalysis recommended. Stomach/Bowel: There is extensive colonic diverticulosis without active inflammatory changes. There is no bowel obstruction. Thickened appearance of the stomach likely related to underdistention. Clinical correlation is recommended.  The appendix is not visualized with certainty. No inflammatory changes identified in the right lower quadrant. Vascular/Lymphatic: There is moderate aortoiliac atherosclerotic disease. No portal venous gas. There is no adenopathy. Reproductive: The prostate and seminal vesicles are grossly unremarkable. Other: There is midline vertical anterior pelvic wall incisional scar. Diffuse subcutaneous edema. Musculoskeletal: There is osteopenia with multilevel degenerative changes of the spine. Grade 1 L4-L5 anterolisthesis. Multilevel disc desiccation with vacuum phenomena. No acute osseous pathology. IMPRESSION: 1. Cirrhosis and small ascites. 2. Extensive colonic diverticulosis. No bowel obstruction  or active inflammation. 3. No hydronephrosis or nephrolithiasis. Thickened urinary bladder likely related to chronic infection. Correlation with urinalysis recommended. 4. Cholelithiasis. 5. Small right pleural effusion, increased since the prior CT. Electronically Signed   By: Elgie Collard M.D.   On: 05/15/2017 22:48   Dg Chest 2 View  Result Date: 05/19/2017 CLINICAL DATA:  Cough EXAM: CHEST - 2 VIEW COMPARISON:  05/15/2017 FINDINGS: The appearance of the chest is unchanged. There is obscuration of the right heart border and right hemidiaphragm presumably from moderate right effusion with compressive atelectasis. Superimposed pneumonia at the right lung base with be difficult to entirely exclude. Aortic atherosclerosis with uncoiling of the thoracic aorta is redemonstrated with mild pulmonary vascular congestion. Left-sided pacemaker apparatus with right ventricular lead is stable. Scarring and/or atelectasis at left base is unchanged. No acute osseous abnormality is seen. IMPRESSION: 1. Unchanged appearance of the chest. Chronic basilar opacification with obscuration of the right heart border and diaphragm compatible with moderate right effusion and probable atelectasis. Pneumonia would be difficult to entirely  exclude at the right lung base. 2. Aortic atherosclerosis. Electronically Signed   By: Tollie Eth M.D.   On: 05/19/2017 21:38   Dg Chest 2 View  Result Date: 05/15/2017 CLINICAL DATA:  82 year old male with shortness of breath. EXAM: CHEST - 2 VIEW COMPARISON:  Chest radiograph dated 02/08/2017 FINDINGS: Right lung base opacity has increased since the prior radiograph and most likely represent combination of pleural effusion and associated atelectasis or infiltrate. Underlying mass is not excluded. Clinical correlation and follow-up to resolution recommended. Left lung base linear atelectasis/scarring. There is no pneumothorax. There is mild cardiomegaly. There is atherosclerotic calcification of the aortic arch. Left pectoral pacemaker device. No acute osseous pathology. IMPRESSION: Right lung base opacity likely combination of pleural effusion and associated atelectasis/infiltrate. Clinical correlation and follow-up to resolution recommended to exclude underlying mass. Electronically Signed   By: Elgie Collard M.D.   On: 05/15/2017 22:36    Microbiology: Recent Results (from the past 240 hour(s))  Urine culture     Status: Abnormal   Collection Time: 05/15/17 11:52 PM  Result Value Ref Range Status   Specimen Description   Final    URINE, RANDOM Performed at [facility], [address] [place], [place] [address]    Special Requests   Final    NONE Performed at [facility], [address] [place], [place] [address]    Culture MULTIPLE SPECIES PRESENT, SUGGEST RECOLLECTION (A)  Final   Report Status 05/17/2017 FINAL  Final  Culture, sputum-assessment     Status: None   Collection Time: 05/17/17  8:49 AM  Result Value Ref Range Status   Specimen Description SPUTUM  Final   Special Requests Normal  Final   Sputum evaluation   Final    THIS SPECIMEN IS ACCEPTABLE FOR SPUTUM CULTURE Performed at [facility], [address], [place], [place] [address]    Report Status 05/17/2017 FINAL  Final  Culture, respiratory (NON-Expectorated)     Status: None   Collection Time: 05/17/17  8:49 AM  Result Value Ref Range Status   Specimen Description   Final    SPUTUM Performed at [facility], [address] [place], [place] [address]    Special Requests   Final    Normal Reflexed from [id number] Performed at [facility], [address] [place], [place] [address]    Gram Stain   Final  FEW WBC PRESENT, PREDOMINANTLY PMN RARE GRAM POSITIVE COCCI RARE GRAM POSITIVE RODS    Culture   Final    Consistent with normal respiratory flora. Performed at [facility], [address], [place], [place] [address]    Report Status 05/19/2017 FINAL  Final     Labs: Basic Metabolic Panel: Recent Labs  Lab 05/15/17 2249 05/17/17 0433 05/18/17 0429 05/19/17 0417 05/20/17 0424  NA 133* 136 136 138 131*  K 4.7 4.0 3.9 3.9 5.1  CL 84* 81* 80* 79* 74*  CO2 40* 44* 45* 48* 47*  GLUCOSE 100* 103* 102* 108* 154*  BUN 35* 24* 20 17 19   CREATININE 1.20 0.86 0.80 0.81 0.84  CALCIUM 8.5* 8.7* 8.9 9.2 9.1  MG  --   --  1.7 1.8 1.8   Liver Function Tests: Recent Labs  Lab 05/15/17 2249 05/18/17 0429  AST 26 23  ALT 15* 14*  ALKPHOS 58 51  BILITOT 1.3* 0.8  PROT 6.3* 6.3*  ALBUMIN 3.5 3.2*   No results for input(s): LIPASE, AMYLASE in the last 168 hours. No results for input(s): AMMONIA in the last 168 hours. CBC: Recent Labs  Lab 05/15/17 2249 05/18/17 0429  WBC 6.9 7.5  NEUTROABS 5.0 5.5  HGB 12.4* 11.8*  HCT 41.7 40.7  MCV 94.1 95.8  PLT 136* 151   Cardiac Enzymes: Recent Labs  Lab 05/16/17 0127 05/16/17 0500 05/16/17 1243  TROPONINI 0.05* 0.05* 0.04*   BNP: BNP (last 3 results) Recent Labs    02/08/17 1152 05/15/17 2249  BNP 233.5* 246.3*    ProBNP (last 3 results) No results for input(s): PROBNP in the last 8760 hours.  CBG: Recent Labs    Lab 05/20/17 0538  GLUCAP 151*       Signed:  Albertine Grates MD, PhD  [facility] 05/20/2017, 12:31 PM

## 2017-05-20 NOTE — Progress Notes (Signed)
Patient discharged home as ordered. Pt A&O upon D/C, VSS. Denied pain or discomfort. No BM in several days- MD aware and MOM given. Suprapubic cath intact and draining to drainage bag with no complications. Spoke with CM regarding HH orders and patient's request for new mattress & nebulizer machine. Per CM, she will f/u with patient- OK to discharge now. Discharge paperwork explained and printed prescriptions given to patient. Pt and son verbalized understanding. Pt assisted to son's car via W/C with assist from nursing staff.

## 2017-05-20 NOTE — Progress Notes (Signed)
ANTICOAGULATION CONSULT NOTE - Follow Up Consult  Pharmacy Consult for warfarin Indication: hx  pulmonary embolus  No Known Allergies  Patient Measurements: Height: 5\' 8"  (172.7 cm) Weight: 181 lb 3.5 oz (82.2 kg) IBW/kg (Calculated) : 68.4 Heparin Dosing Weight:   Vital Signs: Temp: 97.5 F (36.4 C) (04/06 0534) Temp Source: Axillary (04/06 0534) BP: 128/57 (04/06 0534) Pulse Rate: 61 (04/06 0534)  Labs: Recent Labs    05/18/17 0429 05/19/17 0417 05/20/17 0424  HGB 11.8*  --   --   HCT 40.7  --   --   PLT 151  --   --   LABPROT 20.5* 21.4* 26.1*  INR 1.78 1.87 2.42  CREATININE 0.80 0.81 0.84    Estimated Creatinine Clearance: 74.5 mL/min (by C-G formula based on SCr of 0.84 mg/dL).   Medications:  PTA warfarin regimen: 7.5 mg daily except 5mg  on TTSun [facility] and confirmed with pt's son on 4/6)  Assessment: Patient's a 82 y.o M on warfarin PTA for hx afib, presented to the ED on 4/1 with c/o urinary retention.  Warfarin resumed inpatient.  Today, 05/20/2017: - INR is therapeutic at 2.42 (increased from 1.87) - no bleeding documented - no significant drug-drug intxns  Goal of Therapy:  INR 2-3   Plan:  - warfarin 5 mg PO x1 - daily INR - monitor for s/s bleeding  Lyrika Souders P 05/20/2017,10:55 AM

## 2017-05-20 NOTE — Progress Notes (Addendum)
Discharge Planning: Contacted [employer] rep to make aware of dc home today with HH. [facility] bed [facility]. [facility]  Isidoro DonningAlesia Qianna Clagett RN CCM Case Mgmt phone [phone]

## 2017-05-20 NOTE — Progress Notes (Addendum)
Progress Note   Subjective   Doing well today, the patient denies CP.  SOB stable.  Rash remains.  No new concerns  Inpatient Medications    Scheduled Meds: . fluticasone  2 spray Each Nare Daily  . furosemide  40 mg Oral BID  . guaiFENesin  600 mg Oral BID  . isosorbide mononitrate  30 mg Oral Daily  . lidocaine  2 patch Transdermal Q24H  . methylPREDNISolone (SOLU-MEDROL) injection  60 mg Intravenous Q24H  . pantoprazole  40 mg Oral Daily  . polyethylene glycol  17 g Oral Daily  . senna-docusate  1 tablet Oral BID  . sodium chloride flush  3 mL Intravenous Q12H  . Warfarin - Pharmacist Dosing Inpatient   Does not apply q1800   Continuous Infusions: . sodium chloride     PRN Meds: sodium chloride, acetaminophen, albuterol, ALPRAZolam, ipratropium-albuterol, ondansetron (ZOFRAN) IV, oxyCODONE-acetaminophen **AND** oxyCODONE, sodium chloride flush   Vital Signs    Vitals:   05/19/17 1020 05/19/17 1300 05/19/17 2032 05/20/17 0534  BP: (!) 118/54 (!) 111/56 (!) 112/56 (!) 128/57  Pulse: 69 74 (!) 59 61  Resp: 20 (!) 21 17 18   Temp: 98.8 F (37.1 C) 98.6 F (37 C) 98.1 F (36.7 C) (!) 97.5 F (36.4 C)  TempSrc: Oral Axillary Oral Axillary  SpO2: 92% 93% 95% 92%  Weight:    181 lb 3.5 oz (82.2 kg)  Height:        Intake/Output Summary (Last 24 hours) at 05/20/2017 0815 Last data filed at 05/20/2017 0539 Gross per 24 hour  Intake 483 ml  Output 920 ml  Net -437 ml   Filed Weights   05/18/17 0338 05/19/17 0543 05/20/17 0534  Weight: 181 lb 11.2 oz (82.4 kg) 178 lb (80.7 kg) 181 lb 3.5 oz (82.2 kg)    Telemetry    Afib, V paced with occasional pseudofusion - Personally Reviewed  Physical Exam   GEN- The patient is chronically ill and elderly appearing, sleeping with tongue hanging out.  arouses Head- normocephalic, atraumatic Eyes-  Sclera clear, conjunctiva pink Ears- hearing intact Oropharynx- clear Neck- supple, Lungs- decreased BS at bases, normal  work of breathing Heart- irregular rate and rhythm , 26 SEM LUSB which is mid peaking GI- soft, NT, ND, + BS Extremities- no clubbing, cyanosis, + dependant edema  MS- diffuse atrophy Skin- + macular rash Psych- sleeping Neuro- sleeping   Labs    Chemistry Recent Labs  Lab 05/15/17 2249  05/18/17 0429 05/19/17 0417 05/20/17 0424  NA 133*   < > 136 138 131*  K 4.7   < > 3.9 3.9 5.1  CL 84*   < > 80* 79* 74*  CO2 40*   < > 45* 48* 47*  GLUCOSE 100*   < > 102* 108* 154*  BUN 35*   < > 20 17 19   CREATININE 1.20   < > 0.80 0.81 0.84  CALCIUM 8.5*   < > 8.9 9.2 9.1  PROT 6.3*  --  6.3*  --   --   ALBUMIN 3.5  --  3.2*  --   --   AST 26  --  23  --   --   ALT 15*  --  14*  --   --   ALKPHOS 58  --  51  --   --   BILITOT 1.3*  --  0.8  --   --   GFRNONAA 56*   < > >60 >  60 >60  GFRAA >60   < > >60 >60 >60  ANIONGAP 9   < > 11 11 10    < > = values in this interval not displayed.     Hematology Recent Labs  Lab 05/15/17 2249 05/18/17 0429  WBC 6.9 7.5  RBC 4.43 4.25  HGB 12.4* 11.8*  HCT 41.7 40.7  MCV 94.1 95.8  MCH 28.0 27.8  MCHC 29.7* 29.0*  RDW 15.1 15.1  PLT 136* 151    Cardiac Enzymes Recent Labs  Lab 05/16/17 0127 05/16/17 0500 05/16/17 1243  TROPONINI 0.05* 0.05* 0.04*    Recent Labs  Lab 05/15/17 2256  TROPIPOC 0.02        Assessment & Plan    1.  acute on chronic diastolic dysfunction Continue current diuresis Dry wt is 180 lbs, which is about where he is  No changes  2. Permanent afib On coumadin Rate controlled  3. Moderate AS Stable  4. Bradycardia V paced  5. R pleural effusion Moderate in size Ultimately, conservative management may be best.  Could consider thoracentesis if symptomatic.  Cardiology to follow in the outpatient setting.   No new recommendations He is quite elderly and frail.  I am not sure how much longer he will be able to function independently. Primary team to determine disposition  timing.  Cardiology to be available as needed over the weekend Will need close outpatient cardiology follow-up  Hillis RangeJames Cigi Bega MD, [facility] 05/20/2017 8:15 AM

## 2017-05-22 NOTE — Telephone Encounter (Signed)
LMTCB

## 2017-05-23 NOTE — Telephone Encounter (Signed)
LMTCB, TOC attempt #2

## 2017-05-25 NOTE — Telephone Encounter (Signed)
lmtcb  toc attempt # 3 Encounter closed

## 2017-05-26 ENCOUNTER — Ambulatory Visit (INDEPENDENT_AMBULATORY_CARE_PROVIDER_SITE_OTHER): Payer: Medicare Other | Admitting: Pharmacist

## 2017-05-26 DIAGNOSIS — Z7901 Long term (current) use of anticoagulants: Secondary | ICD-10-CM | POA: Diagnosis not present

## 2017-05-26 DIAGNOSIS — I4891 Unspecified atrial fibrillation: Secondary | ICD-10-CM

## 2017-05-26 LAB — POCT INR: INR: 1.6

## 2017-05-28 NOTE — Progress Notes (Signed)
Cardiology Office Note   Date:  05/29/2017   ID:  Howard CombesWalter C Saks Jr., DOB 06/05/1935, MRN [id number]  PCP:  Aida PufferLittle, James, MD  Cardiologist: Howard Torres Chief Complaint  Patient presents with  . Shortness of Breath  . Atrial Fibrillation    On Coumadin     History of Present Illness: Howard CombesWalter C Dubree Jr. is a 82 y.o. male who presents for posthospitalization follow-up after being seen on consultation on 05/17/2017, during admission for decompensated CHF, with known history of permanent atrial fibrillation, on Coumadin therapy, pulmonary hypertension, aortic valve stenosis, pacemaker in situ-MDT Adapta for bradycardia, and chronic angina on nitrates.  Other history includes COPD, OSA.  He was treated for acute on chronic diastolic heart failure with IV diuretics, and is now on p.o. diuretic therapy.  He was continued on Coumadin therapy, dry weight was found to be 180 pounds.  He was found to have a right pleural effusion and was treated conservatively.  Consideration for thoracentesis of the patient continued to be symptomatic.  The patient returned home with an RN and physical therapy.  He is here today without any new complaints, he continues to have chronic shortness of breath, he is oxygen dependent, and wheelchair-bound.  He denies any chest pain.  He denies any fluid retention.  His weight is up approximately 6 pounds since being seen last in the hospital, but he is very strict on his salt intake and has not taken his diuretic this morning because of clinic visit.  He is not yet been contacted by home health.  Coumadin will be checked today on this office visit.  The patient has lost his prescription bottle full of isosorbide and is requesting a new prescription.  Past Medical History:  Diagnosis Date  . Chest pain syndrome   . CHF (congestive heart failure) (HCC)   . COPD (chronic obstructive pulmonary disease) (HCC)   . Dyslipidemia   . GERD (gastroesophageal reflux disease)   .  OSA (obstructive sleep apnea)   . Pacemaker   . Permanent atrial fibrillation (HCC)   . Pulmonary hypertension (HCC)     Past Surgical History:  Procedure Laterality Date  . HERNIA REPAIR    . NM MYOCAR PERF WALL MOTION  11/25/2008   normal  . PERMANENT PACEMAKER INSERTION  11/16/2009   medtronic  . PROSTATE SURGERY       Current Outpatient Medications  Medication Sig Dispense Refill  . albuterol (PROVENTIL HFA;VENTOLIN HFA) 108 (90 BASE) MCG/ACT inhaler Inhale 2 puffs into the lungs every 6 (six) hours as needed for wheezing or shortness of breath.    . ALPRAZolam (XANAX) 1 MG tablet Take 0.5-1 mg by mouth daily as needed for anxiety.    . cyanocobalamin (,VITAMIN B-12,) 1000 MCG/ML injection Inject 1,000 mcg into the muscle every 30 (thirty) days.    . fluticasone (FLONASE) 50 MCG/ACT nasal spray Place 2 sprays into both nostrils daily. (Patient taking differently: Place 2 sprays into both nostrils as needed. ) 16 g 0  . furosemide (LASIX) 40 MG tablet Take 40 mg by mouth 2 (two) times daily.    Marland Kitchen. lidocaine (LIDODERM) 5 % Place 2 patches onto the skin daily. Remove & Discard patch within 12 hours or as directed by MD 30 patch 0  . magnesium hydroxide (MILK OF MAGNESIA) 400 MG/5ML suspension Take 30 mLs by mouth daily as needed for mild constipation. 118 mL 0  . Misc. Devices (MATTRESS COVER) [facility] bed mattress  Patient  request a firm mattress 1 each 0  . nitroGLYCERIN (NITROSTAT) 0.4 MG SL tablet Place 1 tablet (0.4 mg total) under the tongue every 5 (five) minutes as needed for chest pain. 25 tablet 3  . omeprazole (PRILOSEC) 20 MG capsule Take 20 mg by mouth daily.    Marland Kitchen oxyCODONE-acetaminophen (PERCOCET) 10-325 MG per tablet Take 1 tablet by mouth every 6 (six) hours as needed for pain.     . potassium chloride SA (K-DUR,KLOR-CON) 20 MEQ tablet Take 1 tablet (20 mEq total) by mouth every Monday, Wednesday, and Friday. 10 tablet 0  . predniSONE (DELTASONE) 10 MG tablet Label   & dispense according to the schedule below. 6 Pills PO on day one then, 5 Pills PO on day two, 4 Pills PO on day three, 3Pills PO on day four, 2 Pills PO on day five, 1 Pills PO on day six,  then STOP.  Total of 21 tabs 21 tablet 0  . sodium chloride (OCEAN) 0.65 % nasal spray Place 1 spray into the nose as needed for congestion.    . Vitamin D, Ergocalciferol, (DRISDOL) 50000 UNITS CAPS Take 50,000 Units by mouth every 7 (seven) days. Wednesdays.    Marland Kitchen warfarin (COUMADIN) 5 MG tablet TAKE 1 TO 1 AND 1/2 TABLETS BY MOUTH DAILY AS DIRECTED BY COUMADIN CLINIC (Patient taking differently: TAKE 1.5 TABLETS (7.5 MG) BY MOUTH daily except  TAKE 1 TABLET (5 MG)  on Tuesday, Thursday and Sunday) 45 tablet 9  . guaiFENesin (MUCINEX) 600 MG 12 hr tablet Take 1 tablet (600 mg total) by mouth 2 (two) times daily. (Patient not taking: Reported on 05/29/2017) 30 tablet 0  . isosorbide mononitrate (IMDUR) 30 MG 24 hr tablet Take 1 tablet (30 mg total) by mouth daily. 90 tablet 3   No current facility-administered medications for this visit.     Allergies:   Patient has no known allergies.    Social History:  The patient  reports that he has been smoking cigarettes.  He has a 65.00 pack-year smoking history. He has never used smokeless tobacco. He reports that he does not drink alcohol or use drugs.   Family History:  The patient's family history includes CVA in his mother; Colon cancer in his mother; Heart attack in his brother and mother; Heart failure in his father; Lung cancer in his brother; Lung disease in his father.    ROS: All other systems are reviewed and negative. Unless otherwise mentioned in H&P    PHYSICAL EXAM: VS:  BP 128/62 (BP Location: Right Arm, Patient Position: Sitting, Cuff Size: Normal)   Pulse 62   Ht 5\' 8"  (1.727 m)   Wt 186 lb 9.6 oz (84.6 kg)   SpO2 92% Comment: on 2L  BMI 28.37 kg/m  , BMI Body mass index is 28.37 kg/m. GEN: Frail, sitting in wheelchair, oxygen  dependent. HEENT: normal  Neck: no JVD, carotid bruits, or masses Cardiac: RRR; 2/6 systolic murmur radiating into the abdomen no rubs, or gallops,no edema  Respiratory: Bilateral upper airway congestion, cleared with cough, diminished in the right base with bibasilar crackles noted. GI: soft, nontender, nondistended, + BS MS: no deformity or atrophy  Skin: warm and dry, no rash Neuro:  Strength and sensation are intact Psych: euthymic mood, full affect   EKG: Not completed during this office visit.  Recent Labs: 05/15/2017: B Natriuretic Peptide 246.3 05/18/2017: ALT 14; Hemoglobin 11.8; Platelets 151; TSH 1.863 05/20/2017: BUN 19; Creatinine, Ser 0.84; Magnesium 1.8; Potassium  5.1; Sodium 131    Lipid Panel    Component Value Date/Time   CHOL 102 05/29/2014 1121   TRIG 114 05/29/2014 1121   HDL 41 05/29/2014 1121   CHOLHDL 2.5 05/29/2014 1121   VLDL 23 05/29/2014 1121   LDLCALC 38 05/29/2014 1121      Wt Readings from Last 3 Encounters:  05/29/17 186 lb 9.6 oz (84.6 kg)  05/20/17 181 lb 3.5 oz (82.2 kg)  02/11/17 181 lb (82.1 kg)    Other studies Reviewed: Echocardiogram 2017-05-22 Left ventricle: Flattening of the septum consistent with   pulmonary HTN. The cavity size was normal. Wall thickness was   normal. Systolic function was normal. The estimated ejection   fraction was in the range of 50% to 55%. The study is not   technically sufficient to allow evaluation of LV diastolic   function. - Aortic valve: There was moderate stenosis. Valve area (VTI): 0.89   cm^2. Valve area (Vmax): 0.96 cm^2. Valve area (Vmean): 0.84   cm^2. - Mitral valve: Nodular calcification of the mitral leaflet tips.   There was mild regurgitation. - Left atrium: The atrium was moderately dilated. - Right ventricle: The cavity size was moderately dilated. - Right atrium: The atrium was severely dilated. - Atrial septum: No defect or patent foramen ovale was identified. - Pulmonary arteries:  PA peak pressure: 62 mm Hg (S).   ASSESSMENT AND PLAN:  1.  Chronic diastolic CHF: The patient does not have evidence of volume overload on this office visit.  His weight is up 6 pounds as he is wearing closed and has not taken his diuretic yet this morning.  He remains on Lasix 40 mg twice daily.  He is also on potassium replacement.  He has run out of isosorbide as he has accidentally discarded this medication while cleaning out medication bottles and discarding them.  A new prescription is provided for isosorbide mononitrate 30 mg daily..  2.  Atrial fibrillation: He continues on Coumadin therapy with good heart rate control.  He is not on any AV nodal blocking agents.  He is seeing pharmacist today for INR and dosing.  3.  Right pleural effusion: The patient will have a repeat PA and lateral chest x-ray to evaluate worsening effusion, and make decision concerning need for thoracentesis or continue medical management. Current medicines are reviewed at length with the patient today.  I will also make sure that [facility] are notified. [facility] nurses have tried to contact him but have not been able to contact him. This was noted after he left appt.   4.  Moderate aortic valve stenosis: Most recent echocardiogram dated 05-22-2017 revealed an aortic valve with moderate stenosis.  The patient also had moderate mitral valve regurgitation.  Continue medical management.   Labs/ tests ordered today include: PA and lateral chest x-ray.  Bettey Mare. Liborio Nixon, ANP, AACC   05/29/2017 11:47 AM    [facility] [address] [place], [place] [address] Phone: [phone]; Fax: [phone]

## 2017-05-29 ENCOUNTER — Ambulatory Visit (INDEPENDENT_AMBULATORY_CARE_PROVIDER_SITE_OTHER): Payer: Self-pay | Admitting: Pharmacist Clinician (PhC)/ Clinical Pharmacy Specialist

## 2017-05-29 ENCOUNTER — Ambulatory Visit
Admission: RE | Admit: 2017-05-29 | Discharge: 2017-05-29 | Disposition: A | Discharge status: Home / Self Care | Payer: Medicare Other | Source: Ambulatory Visit | Attending: Adult Health | Admitting: Adult Health

## 2017-05-29 ENCOUNTER — Ambulatory Visit (INDEPENDENT_AMBULATORY_CARE_PROVIDER_SITE_OTHER): Payer: Medicare Other | Admitting: Adult Health

## 2017-05-29 ENCOUNTER — Encounter: Payer: Self-pay | Admitting: Adult Health

## 2017-05-29 VITALS — BP 128/62 | HR 62 | Ht 68.0 in | Wt 186.6 lb

## 2017-05-29 DIAGNOSIS — J90 Pleural effusion, not elsewhere classified: Secondary | ICD-10-CM

## 2017-05-29 DIAGNOSIS — I700 Atherosclerosis of aorta: Secondary | ICD-10-CM

## 2017-05-29 DIAGNOSIS — J42 Unspecified chronic bronchitis: Secondary | ICD-10-CM | POA: Diagnosis not present

## 2017-05-29 DIAGNOSIS — I4891 Unspecified atrial fibrillation: Secondary | ICD-10-CM

## 2017-05-29 DIAGNOSIS — I5032 Chronic diastolic (congestive) heart failure: Secondary | ICD-10-CM | POA: Diagnosis not present

## 2017-05-29 DIAGNOSIS — I272 Pulmonary hypertension, unspecified: Secondary | ICD-10-CM

## 2017-05-29 DIAGNOSIS — Z79899 Other long term (current) drug therapy: Secondary | ICD-10-CM | POA: Diagnosis not present

## 2017-05-29 DIAGNOSIS — Z7901 Long term (current) use of anticoagulants: Secondary | ICD-10-CM

## 2017-05-29 LAB — POCT INR: INR: 1.9

## 2017-05-29 MED ORDER — ISOSORBIDE MONONITRATE ER 30 MG PO TB24: 30.0000 mg | ORAL_TABLET | Freq: Every day | ORAL | 3 refills | Status: AC

## 2017-05-29 NOTE — Patient Instructions (Signed)
Medication Instructions:  NO CHANGES- Your physician recommends that you continue on your current medications as directed. Please refer to the Current Medication list given to you today.  If you need a refill on your cardiac medications before your next appointment, please call your pharmacy.  Labwork: TSH TODAY HERE IN OUR OFFICE AT [facility]  Take the provided lab slips for you to take with you to the lab for you blood draw.   Special Instructions: GO TO [facility]  FOLLOW UP WITH DR LITTLE FOR HOME HEALTH REFERRAL   Follow-Up: Your physician wants you to follow-up in: 1 MONTH WITH DR CROITORU -OR- KATHRYN LAWRENCE (NURSE PRACTIONIER), DNP,AAA IF PRIMARY CARDIOLOGIST IS UNAVAILABLE.   [facility] you [facility]!!

## 2017-05-29 NOTE — Progress Notes (Signed)
Thank you MCr 

## 2017-05-30 LAB — TSH: TSH: 2.16 u[IU]/mL (ref 0.450–4.500)

## 2017-06-01 ENCOUNTER — Ambulatory Visit (INDEPENDENT_AMBULATORY_CARE_PROVIDER_SITE_OTHER): Payer: Medicare Other | Admitting: *Deleted

## 2017-06-01 DIAGNOSIS — I482 Chronic atrial fibrillation, unspecified: Secondary | ICD-10-CM

## 2017-06-01 LAB — CUP PACEART REMOTE DEVICE CHECK
Battery Impedance: 2795 Ohm
Battery Remaining Longevity: 25 mo
Battery Voltage: 2.74 V
Implantable Lead Implant Date: 20111003
Implantable Lead Location: 753860
Implantable Lead Model: 5076
Implantable Pulse Generator Implant Date: 20111003
Lead Channel Pacing Threshold Pulse Width: 0.4 ms
Lead Channel Setting Sensing Sensitivity: 2.8 mV
MDC IDC MSMT LEADCHNL RA IMPEDANCE VALUE: 0 Ohm
MDC IDC MSMT LEADCHNL RV IMPEDANCE VALUE: 542 Ohm
MDC IDC MSMT LEADCHNL RV PACING THRESHOLD AMPLITUDE: 0.5 V
MDC IDC SESS DTM: 20190418135808
MDC IDC SET LEADCHNL RV PACING AMPLITUDE: 2.5 V
MDC IDC SET LEADCHNL RV PACING PULSEWIDTH: 0.4 ms
MDC IDC STAT BRADY RV PERCENT PACED: 94 %

## 2017-06-01 NOTE — Progress Notes (Signed)
Remote pacemaker transmission.   

## 2017-06-02 ENCOUNTER — Encounter: Payer: Self-pay | Admitting: Cardiology

## 2017-06-16 ENCOUNTER — Ambulatory Visit (INDEPENDENT_AMBULATORY_CARE_PROVIDER_SITE_OTHER): Payer: Medicare Other | Admitting: Pharmacist Clinician (PhC)/ Clinical Pharmacy Specialist

## 2017-06-16 DIAGNOSIS — I4891 Unspecified atrial fibrillation: Secondary | ICD-10-CM | POA: Diagnosis not present

## 2017-06-16 DIAGNOSIS — Z7901 Long term (current) use of anticoagulants: Secondary | ICD-10-CM

## 2017-06-16 LAB — POCT INR: INR: 1.7

## 2017-06-18 ENCOUNTER — Encounter (HOSPITAL_COMMUNITY): Payer: Self-pay

## 2017-06-18 ENCOUNTER — Emergency Department (HOSPITAL_COMMUNITY): Payer: Medicare Other

## 2017-06-18 ENCOUNTER — Emergency Department (HOSPITAL_COMMUNITY)
Admission: EM | Admit: 2017-06-18 | Discharge: 2017-06-18 | Disposition: A | Discharge status: Home / Self Care | Payer: Medicare Other | Attending: Emergency Medicine | Admitting: Emergency Medicine

## 2017-06-18 DIAGNOSIS — Z466 Encounter for fitting and adjustment of urinary device: Secondary | ICD-10-CM | POA: Diagnosis not present

## 2017-06-18 DIAGNOSIS — R600 Localized edema: Secondary | ICD-10-CM | POA: Diagnosis not present

## 2017-06-18 DIAGNOSIS — Z79899 Other long term (current) drug therapy: Secondary | ICD-10-CM | POA: Insufficient documentation

## 2017-06-18 DIAGNOSIS — R55 Syncope and collapse: Secondary | ICD-10-CM | POA: Diagnosis present

## 2017-06-18 DIAGNOSIS — I5042 Chronic combined systolic (congestive) and diastolic (congestive) heart failure: Secondary | ICD-10-CM | POA: Insufficient documentation

## 2017-06-18 DIAGNOSIS — R0600 Dyspnea, unspecified: Secondary | ICD-10-CM | POA: Insufficient documentation

## 2017-06-18 DIAGNOSIS — Y929 Unspecified place or not applicable: Secondary | ICD-10-CM | POA: Insufficient documentation

## 2017-06-18 DIAGNOSIS — S098XXA Other specified injuries of head, initial encounter: Secondary | ICD-10-CM | POA: Diagnosis not present

## 2017-06-18 DIAGNOSIS — Z950 Presence of cardiac pacemaker: Secondary | ICD-10-CM | POA: Insufficient documentation

## 2017-06-18 DIAGNOSIS — Y9389 Activity, other specified: Secondary | ICD-10-CM | POA: Insufficient documentation

## 2017-06-18 DIAGNOSIS — I2510 Atherosclerotic heart disease of native coronary artery without angina pectoris: Secondary | ICD-10-CM | POA: Insufficient documentation

## 2017-06-18 DIAGNOSIS — Y999 Unspecified external cause status: Secondary | ICD-10-CM | POA: Insufficient documentation

## 2017-06-18 DIAGNOSIS — J449 Chronic obstructive pulmonary disease, unspecified: Secondary | ICD-10-CM | POA: Insufficient documentation

## 2017-06-18 DIAGNOSIS — I110 Hypertensive heart disease with heart failure: Secondary | ICD-10-CM | POA: Diagnosis not present

## 2017-06-18 DIAGNOSIS — W07XXXA Fall from chair, initial encounter: Secondary | ICD-10-CM | POA: Diagnosis not present

## 2017-06-18 DIAGNOSIS — Z7901 Long term (current) use of anticoagulants: Secondary | ICD-10-CM | POA: Insufficient documentation

## 2017-06-18 DIAGNOSIS — F17210 Nicotine dependence, cigarettes, uncomplicated: Secondary | ICD-10-CM | POA: Diagnosis not present

## 2017-06-18 LAB — URINALYSIS, ROUTINE W REFLEX MICROSCOPIC
Bilirubin Urine: NEGATIVE
Glucose, UA: NEGATIVE mg/dL
Ketones, ur: NEGATIVE mg/dL
Nitrite: POSITIVE — AB
Protein, ur: 30 mg/dL — AB
Specific Gravity, Urine: 1.005 (ref 1.005–1.030)
pH: 8 (ref 5.0–8.0)

## 2017-06-18 LAB — PROTIME-INR
INR: 1.67
Prothrombin Time: 19.6 seconds — ABNORMAL HIGH (ref 11.4–15.2)

## 2017-06-18 LAB — BASIC METABOLIC PANEL
Anion gap: 9 (ref 5–15)
BUN: 23 mg/dL — ABNORMAL HIGH (ref 6–20)
CO2: 44 mmol/L — ABNORMAL HIGH (ref 22–32)
Calcium: 8.4 mg/dL — ABNORMAL LOW (ref 8.9–10.3)
Chloride: 80 mmol/L — ABNORMAL LOW (ref 101–111)
Creatinine, Ser: 1 mg/dL (ref 0.61–1.24)
GFR calc Af Amer: >60 mL/min (ref >60)
GFR calc non Af Amer: >60 mL/min (ref >60)
Glucose, Bld: 90 mg/dL (ref 65–99)
Potassium: 4.5 mmol/L (ref 3.5–5.1)
Sodium: 133 mmol/L — ABNORMAL LOW (ref 135–145)

## 2017-06-18 LAB — CBC
HCT: 39.1 % (ref 39.0–52.0)
Hemoglobin: 11.5 g/dL — ABNORMAL LOW (ref 13.0–17.0)
MCH: 27.6 pg (ref 26.0–34.0)
MCHC: 29.4 g/dL — ABNORMAL LOW (ref 30.0–36.0)
MCV: 93.8 fL (ref 78.0–100.0)
Platelets: 102 10*3/uL — ABNORMAL LOW (ref 150–400)
RBC: 4.17 MIL/uL — ABNORMAL LOW (ref 4.22–5.81)
RDW: 15.8 % — ABNORMAL HIGH (ref 11.5–15.5)
WBC: 5.2 10*3/uL (ref 4.0–10.5)

## 2017-06-18 LAB — BRAIN NATRIURETIC PEPTIDE: B Natriuretic Peptide: 167.7 pg/mL — ABNORMAL HIGH (ref 0.0–100.0)

## 2017-06-18 MED ORDER — HYDROMORPHONE HCL 2 MG/ML IJ SOLN
0.5000 mg | Freq: Once | INTRAMUSCULAR | Status: AC
  Administered 2017-06-18: 0.5 mg via INTRAVENOUS
  Filled 2017-06-18: qty 1

## 2017-06-18 NOTE — ED Triage Notes (Signed)
Pt brought in by [facility] for syncopal episode that resulted in fall. Pt states he hit his head on the ground. Pt denies neck or back pain. Pt states he has experienced an increase in generalized weakness since December. Pt wears 3L O2 at home. Bruising noted to the left of pt head.

## 2017-06-18 NOTE — ED Provider Notes (Signed)
[facility] EMERGENCY DEPARTMENT Provider Note   CSN: [id number] Arrival date & time: 06/18/17  1415     History   Chief Complaint Chief Complaint  Patient presents with  . Fall  . Weakness  . Dizziness    HPI Howard Torres. is a 82 y.o. male.  HPI   82 year old male with possible syncopal event.  Happened just before arrival.  Patient was sitting in chair at the table when he fell out of the chair.  He states it felt like someone was shaking him.  Denies any acute pain, palpitations, nausea or diaphoresis.  He has chronic dyspnea.  Denies any acute change in this.  He fell onto his left side.  He did strike his head.  He is on Coumadin.  Currently feeling better.  Chronic lower extremity edema.  Perhaps somewhat worse in the last couple days.  Reports compliance with his medications.  Also some chronic lower abdominal pain.  Chronic suprapubic catheter.  He is due for a change.  Past Medical History:  Diagnosis Date  . Chest pain syndrome   . CHF (congestive heart failure) (HCC)   . COPD (chronic obstructive pulmonary disease) (HCC)   . Dyslipidemia   . GERD (gastroesophageal reflux disease)   . OSA (obstructive sleep apnea)   . Pacemaker   . Permanent atrial fibrillation (HCC)   . Pulmonary hypertension [facility])     Patient Active Problem List   Diagnosis Date Noted  . CAP (community acquired pneumonia) 05/16/2017  . Chronic respiratory failure with hypoxia (HCC) 05/16/2017  . Pleural effusion 05/16/2017  . Fluid overload 05/16/2017  . CHF (congestive heart failure), NYHA class II, acute, diastolic (HCC) 05/16/2017  . Chronic cystitis 05/16/2017  . Pulmonary hypertension (HCC) 02/11/2017  . Edema 02/11/2017  . Acute on chronic combined systolic and diastolic CHF (congestive heart failure) (HCC) 02/08/2017  . Angina pectoris (HCC) 11/25/2016  . PAH (pulmonary artery hypertension) (HCC) 08/26/2016  . Aortic atherosclerosis (HCC) 08/26/2016  .  COPD GOLD 0 11/11/2014  . Pacemaker 12/23/2012  . Long term (current) use of anticoagulants 05/04/2012  . HYPERTENSION 06/25/2008  . OSA (obstructive sleep apnea) 06/25/2008  . Atrial fibrillation with slow ventricular response (HCC) 06/12/2008  . Hypercholesterolemia 06/10/2008  . MORBID OBESITY 06/10/2008  . CAD (coronary artery disease) 06/10/2008  . Chronic pulmonary heart disease (HCC) 06/10/2008  . RENAL FAILURE, CHRONIC 06/10/2008  . BENIGN PROSTATIC HYPERTROPHY, HX OF 06/10/2008  . OBESITY, NOS 04/13/2006  . Major depressive disorder, recurrent episode (HCC) 04/13/2006  . ANXIETY 04/13/2006  . Tobacco use disorder 04/13/2006  . MIGRAINE, UNSPEC., W/O INTRACTABLE MIGRAINE 04/13/2006  . HYPERTENSION, BENIGN SYSTEMIC 04/13/2006  . Chronic airway obstruction, not elsewhere classified 04/13/2006  . PEPTIC ULCER DIS., UNSPEC. W/O OBSTRUCTION 04/13/2006  . Hyperplasia of prostate 04/13/2006  . SEBORRHEIC DERMATITIS, NOS 04/13/2006  . OSTEOARTHRITIS, MULTI SITES 04/13/2006    Past Surgical History:  Procedure Laterality Date  . HERNIA REPAIR    . NM MYOCAR PERF WALL MOTION  11/25/2008   normal  . PERMANENT PACEMAKER INSERTION  11/16/2009   medtronic  . PROSTATE SURGERY          Home Medications    Prior to Admission medications   Medication Sig Start Date End Date Taking? Authorizing Provider  albuterol (PROVENTIL HFA;VENTOLIN HFA) 108 (90 BASE) MCG/ACT inhaler Inhale 2 puffs into the lungs every 6 (six) hours as needed for wheezing or shortness of breath.   Yes [provider]  ALPRAZolam (XANAX) 1 MG tablet Take 0.5-1 mg by mouth every 6 (six) hours as needed for anxiety or sleep.    Yes [provider]  cyanocobalamin (,VITAMIN B-12,) 1000 MCG/ML injection Inject 1,000 mcg into the muscle every 30 (thirty) days.   Yes [provider]  fluticasone (FLONASE) 50 MCG/ACT nasal spray Place 2 sprays into both nostrils daily. Patient taking  differently: Place 2 sprays into both nostrils daily as needed for allergies (congestion).  05/21/17  Yes Albertine Grates, MD  furosemide (LASIX) 40 MG tablet Take 40 mg by mouth 2 (two) times daily.   Yes [provider]  isosorbide mononitrate (IMDUR) 30 MG 24 hr tablet Take 1 tablet (30 mg total) by mouth daily. 05/29/17 08/27/17 Yes Jodelle Gross, NP  magnesium hydroxide (MILK OF MAGNESIA) 400 MG/5ML suspension Take 30 mLs by mouth daily as needed for mild constipation. 05/20/17  Yes Albertine Grates, MD  omeprazole (PRILOSEC) 20 MG capsule Take 20 mg by mouth See admin instructions. Take one capsule by mouth every morning, may also take a 2nd capsule during the day if needed for acid reflux/indigestion   Yes [provider]  OVER THE COUNTER MEDICATION Place 1 drop into both eyes 2 (two) times daily as needed (dry eyes). Over the counter lubricating eye drop   Yes [provider]  oxyCODONE-acetaminophen (PERCOCET) 10-325 MG per tablet Take 1 tablet by mouth every 4 (four) hours as needed for pain.    Yes [provider]  OXYGEN Inhale 2.5 L into the lungs continuous.   Yes [provider]  potassium chloride SA (K-DUR,KLOR-CON) 20 MEQ tablet Take 1 tablet (20 mEq total) by mouth every Monday, Wednesday, and Friday. Patient taking differently: Take 20 mEq by mouth 2 (two) times daily.  05/22/17  Yes Albertine Grates, MD  Vitamin D, Ergocalciferol, (DRISDOL) 50000 UNITS CAPS Take 50,000 Units by mouth every Wednesday.    Yes [provider]  warfarin (COUMADIN) 5 MG tablet TAKE 1 TO 1 AND 1/2 TABLETS BY MOUTH DAILY AS DIRECTED BY COUMADIN CLINIC Patient taking differently: TAKE 1 1/2 TABLETS BY MOUTH ON MONDAY, WEDNESDAY, FRIDAY/ TAKE 1 TABLET ON SUNDAY, TUESDAY, THURSDAY, SATURDAY 12/05/16  Yes Croitoru, Mihai, MD  guaiFENesin (MUCINEX) 600 MG 12 hr tablet Take 1 tablet (600 mg total) by mouth 2 (two) times daily. Patient not taking: Reported on 05/29/2017 05/20/17   Albertine Grates, MD  lidocaine (LIDODERM) 5 % Place 2 patches onto the skin daily. Remove & Discard patch within 12 hours or as directed by MD Patient not taking: Reported on 06/18/2017 05/20/17   Albertine Grates, MD  Misc. Devices (MATTRESS COVER) [facility] bed mattress  Patient request a firm mattress 05/20/17   Albertine Grates, MD  nitroGLYCERIN (NITROSTAT) 0.4 MG SL tablet Place 1 tablet (0.4 mg total) under the tongue every 5 (five) minutes as needed for chest pain. Patient not taking: Reported on 06/18/2017 08/24/16   Croitoru, Rachelle Hora, MD    Family History Family History  Problem Relation Age of Onset  . Colon cancer Mother   . CVA Mother   . Heart attack Mother   . Heart failure Father   . Lung disease Father   . Lung cancer Brother   . Heart attack Brother     Social History Social History   Tobacco Use  . Smoking status: Current Some Day Smoker    Packs/day: 1.00    Years: 65.00    Pack  years: 65.00    Types: Cigarettes  . Smokeless tobacco: Never Used  Substance Use Topics  . Alcohol use: No    Alcohol/week: 0.0 oz  . Drug use: No     Allergies   Patient has no known allergies.   Review of Systems Review of Systems  All systems reviewed and negative, other than as noted in HPI.  Physical Exam Updated Vital Signs BP 110/68   Pulse 64   Resp 17   Wt 85.3 kg (188 lb)   SpO2 98%   BMI 28.59 kg/m   Physical Exam  Constitutional: He is oriented to person, place, and time. He appears well-developed and well-nourished. No distress.  HENT:  Head: Normocephalic and atraumatic.  Eyes: Conjunctivae are normal. Right eye exhibits no discharge. Left eye exhibits no discharge.  Neck: Neck supple.  Cardiovascular: Normal rate and regular rhythm. Exam reveals no gallop and no friction rub.  Murmur heard. Mild systolic murmur  Pulmonary/Chest: Effort normal. No respiratory distress.  Coarse breath sounds bilaterally.  Abdominal: Soft. He exhibits no distension. There is no tenderness.    Abdomen is soft and nontender.  Surgical scar right inguinal region.  He complains of lower abdominal pain but is not really tender on exam.  Suprapubic catheter in place draining pale yellow urine to leg bag.  Letter petechial rash noted to abdomen and chest.  Musculoskeletal: He exhibits edema. He exhibits no tenderness.  Symmetric pitting lower extremity edema.  Extends up into scrotum/lower abdomen.  Neurological: He is alert and oriented to person, place, and time.  Skin: Skin is warm and dry.  Psychiatric: He has a normal mood and affect. His behavior is normal. Thought content normal.  Nursing note and vitals reviewed.    ED Treatments / Results  Labs (all labs ordered are listed, but only abnormal results are displayed) Labs Reviewed  BASIC METABOLIC PANEL - Abnormal; Notable for the following components:      Result Value   Sodium 133 (*)    Chloride 80 (*)    CO2 44 (*)    BUN 23 (*)    Calcium 8.4 (*)    All other components within normal limits  CBC - Abnormal; Notable for the following components:   RBC 4.17 (*)    Hemoglobin 11.5 (*)    MCHC 29.4 (*)    RDW 15.8 (*)    Platelets 102 (*)    All other components within normal limits  PROTIME-INR - Abnormal; Notable for the following components:   Prothrombin Time 19.6 (*)    All other components within normal limits  BRAIN NATRIURETIC PEPTIDE - Abnormal; Notable for the following components:   B Natriuretic Peptide 167.7 (*)    All other components within normal limits  URINALYSIS, ROUTINE W REFLEX MICROSCOPIC  TROPONIN I  CBG MONITORING, ED    EKG None  Radiology Dg Chest 2 View  Result Date: 06/18/2017 CLINICAL DATA:  Dyspnea EXAM: CHEST - 2 VIEW COMPARISON:  05/29/2017 chest radiograph. FINDINGS: Stable configuration of single lead left subclavian pacemaker. Stable cardiomediastinal silhouette with mild cardiomegaly. No pneumothorax. Stable small right pleural effusion. No overt pulmonary edema. Patchy  right greater than left lung base opacities, slightly worsened. Hyperinflated lungs. IMPRESSION: 1. Stable small right pleural effusion. 2. Slightly worsened patchy bibasilar lung opacities, right greater than left, which could represent aspiration, pneumonia and/or atelectasis. 3. Hyperinflated lungs, suggesting COPD. Electronically Signed   By: Delbert Phenix M.D.   On: 06/18/2017 16:07  Ct Head Wo Contrast  Result Date: 06/18/2017 CLINICAL DATA:  Fall today with head injury. Left forehead soft tissue swelling. EXAM: CT HEAD WITHOUT CONTRAST TECHNIQUE: Contiguous axial images were obtained from the base of the skull through the vertex without intravenous contrast. COMPARISON:  None. FINDINGS: Brain: There is no evidence of acute intracranial hemorrhage, mass lesion, brain edema or extra-axial fluid collection. The ventricles and subarachnoid spaces are appropriately sized for age. There is no CT evidence of acute cortical infarction. Vascular: Intracranial vascular calcifications. No hyperdense vessel identified. Skull: Negative for fracture or focal lesion. Sinuses/Orbits: The visualized paranasal sinuses and mastoid air cells are clear. No orbital abnormalities are seen. Other: Left temporal scalp soft tissue swelling. Previous lens surgery on the right. IMPRESSION: Left temporal scalp soft tissue swelling. No acute intracranial or calvarial findings. Electronically Signed   By: Carey Bullocks M.D.   On: 06/18/2017 16:02    Procedures Procedures (including critical care time)  Suprapubic catheter exchange  Patient was laid supine. The area was prepped with Betadine. The balloon on the existing catheter was deflated.  The catheter was removed easily with minimal traction.  Patient did experience some mild discomfort and minimal bleeding though. The area was reprepped with Betadine and I exchanged for clean gloves. A new 18 French Foley catheter was passed easily through the stoma.  The balloon was  inflated with 10cc of saline and catheter was attached to a leg bag. Urine noted flowing through the new catheter into the bag.  Medications Ordered in ED Medications  HYDROmorphone (DILAUDID) injection 0.5 mg (has no administration in time range)     Initial Impression / Assessment and Plan / ED Course  I have reviewed the triage vital signs and the nursing notes.  Pertinent labs & imaging results that were available during my care of the patient were reviewed by me and considered in my medical decision making (see chart for details).     82 year old male with possible syncopal event.  Did strike his head.  Is anticoagulated on Coumadin.  He denies much in terms of pain in his neuro exam is nonfocal.  CT that does not show any acute abnormality.  Chest x-ray with a stable effusion.  He denies any acute dyspnea.  EKG is paced.  He would like to go home.  I feel is appropriate to do so at this time.  Unfortunately, he has any chronic medical illnesses and is deconditioned.  I doubt emergent process which necessitates hospitalization at this time.  He is due to have his suprapubic catheter changed this was done in the emergency room.  Final Clinical Impressions(s) / ED Diagnoses   Final diagnoses:  Syncope, unspecified syncope type    ED Discharge Orders    None       Raeford Razor, MD 06/20/17 1135

## 2017-06-28 ENCOUNTER — Encounter (HOSPITAL_COMMUNITY): Payer: Self-pay | Admitting: *Deleted

## 2017-06-28 ENCOUNTER — Emergency Department (HOSPITAL_COMMUNITY)
Admission: EM | Admit: 2017-06-28 | Discharge: 2017-06-28 | Disposition: A | Discharge status: Home / Self Care | Payer: Medicare Other | Attending: Emergency Medicine | Admitting: Emergency Medicine

## 2017-06-28 ENCOUNTER — Ambulatory Visit: Payer: Medicare Other | Admitting: Adult Health

## 2017-06-28 DIAGNOSIS — I2510 Atherosclerotic heart disease of native coronary artery without angina pectoris: Secondary | ICD-10-CM | POA: Diagnosis not present

## 2017-06-28 DIAGNOSIS — Z950 Presence of cardiac pacemaker: Secondary | ICD-10-CM | POA: Insufficient documentation

## 2017-06-28 DIAGNOSIS — Z79899 Other long term (current) drug therapy: Secondary | ICD-10-CM | POA: Diagnosis not present

## 2017-06-28 DIAGNOSIS — Z7901 Long term (current) use of anticoagulants: Secondary | ICD-10-CM | POA: Insufficient documentation

## 2017-06-28 DIAGNOSIS — F17210 Nicotine dependence, cigarettes, uncomplicated: Secondary | ICD-10-CM | POA: Diagnosis not present

## 2017-06-28 DIAGNOSIS — R319 Hematuria, unspecified: Secondary | ICD-10-CM | POA: Diagnosis not present

## 2017-06-28 DIAGNOSIS — I5042 Chronic combined systolic (congestive) and diastolic (congestive) heart failure: Secondary | ICD-10-CM | POA: Insufficient documentation

## 2017-06-28 DIAGNOSIS — Y828 Other medical devices associated with adverse incidents: Secondary | ICD-10-CM | POA: Insufficient documentation

## 2017-06-28 DIAGNOSIS — T83198A Other mechanical complication of other urinary devices and implants, initial encounter: Secondary | ICD-10-CM | POA: Diagnosis not present

## 2017-06-28 DIAGNOSIS — T83010A Breakdown (mechanical) of cystostomy catheter, initial encounter: Secondary | ICD-10-CM

## 2017-06-28 DIAGNOSIS — I110 Hypertensive heart disease with heart failure: Secondary | ICD-10-CM | POA: Insufficient documentation

## 2017-06-28 DIAGNOSIS — J449 Chronic obstructive pulmonary disease, unspecified: Secondary | ICD-10-CM | POA: Diagnosis not present

## 2017-06-28 HISTORY — DX: Atherosclerotic heart disease of native coronary artery without angina pectoris: I25.10

## 2017-06-28 LAB — URINALYSIS, ROUTINE W REFLEX MICROSCOPIC
Bilirubin Urine: NEGATIVE
Glucose, UA: NEGATIVE mg/dL
KETONES UR: NEGATIVE mg/dL
Nitrite: POSITIVE — AB
PH: 8 (ref 5.0–8.0)
Protein, ur: 100 mg/dL — AB
RBC / HPF: >50 RBC/hpf — ABNORMAL HIGH (ref 0–5)
SPECIFIC GRAVITY, URINE: 1.008 (ref 1.005–1.030)

## 2017-06-28 MED ORDER — CEPHALEXIN 500 MG PO CAPS: 500.0000 mg | ORAL_CAPSULE | Freq: Four times a day (QID) | ORAL | 0 refills | Status: AC

## 2017-06-28 NOTE — ED Triage Notes (Addendum)
PTAR states pt has a supra pubic cath, tried to flush prior to coming in, unsuccessful. Changed on April 18th

## 2017-06-28 NOTE — ED Notes (Signed)
Irrigated cath without difficulty, drains well. Pt insistent that it be changed so he will not have to follow up with his urologist. Writer tried to explain that if there is nothing wrong with the draining and flushing it is better to let his urologist change the cath.

## 2017-06-28 NOTE — ED Notes (Signed)
Pt waiting for test results to return and EDP to update.

## 2017-06-28 NOTE — Discharge Instructions (Addendum)
Catheter flowing well here today.  Follow-up with urology to have it rechecked.  And also take the antibiotic Keflex as directed for the next 7 days.  Urine culture has been sent.  When you have a suprapubic catheter is hard to tell whether there is truly an infection or not culture will help sort this out.

## 2017-06-28 NOTE — ED Notes (Signed)
Bed: WA17 Expected date:  Expected time:  Means of arrival:  Comments: EMS/foley cath. problem

## 2017-06-28 NOTE — ED Provider Notes (Signed)
[facility]-EMERGENCY DEPT Provider Note   CSN: [id number] Arrival date & time: 06/28/17  1105     History   Chief Complaint No chief complaint on file.   HPI Howard Torres. is a 82 y.o. male.  Patient on long-term oxygen at home today with concerns for suprapubic catheter being obstructed.  Patient says suprapubic catheter for a long time following prostate surgery.  Followed by [facility].  They are in the process of transitioning to have the catheter changed out at home every 4 weeks instead of having urology do it.  Last changed out about 3 weeks ago.  Patient's son tried to flush the catheter at home was obstructed some clots did come out but when flow well.  Patient at that time did have some abdominal discomfort and felt as if he was distended.  Patient overall feeling better upon arrival here.     Past Medical History:  Diagnosis Date  . Chest pain syndrome   . CHF (congestive heart failure) (HCC)   . COPD (chronic obstructive pulmonary disease) (HCC)   . Coronary artery disease   . Dyslipidemia   . GERD (gastroesophageal reflux disease)   . OSA (obstructive sleep apnea)   . Pacemaker   . Permanent atrial fibrillation (HCC)   . Pulmonary hypertension [facility])     Patient Active Problem List   Diagnosis Date Noted  . CAP (community acquired pneumonia) 05/16/2017  . Chronic respiratory failure with hypoxia (HCC) 05/16/2017  . Pleural effusion 05/16/2017  . Fluid overload 05/16/2017  . CHF (congestive heart failure), NYHA class II, acute, diastolic (HCC) 05/16/2017  . Chronic cystitis 05/16/2017  . Pulmonary hypertension (HCC) 02/11/2017  . Edema 02/11/2017  . Acute on chronic combined systolic and diastolic CHF (congestive heart failure) (HCC) 02/08/2017  . Angina pectoris (HCC) 11/25/2016  . PAH (pulmonary artery hypertension) (HCC) 08/26/2016  . Aortic atherosclerosis (HCC) 08/26/2016  . COPD GOLD 0 11/11/2014  . Pacemaker  12/23/2012  . Long term (current) use of anticoagulants 05/04/2012  . HYPERTENSION 06/25/2008  . OSA (obstructive sleep apnea) 06/25/2008  . Atrial fibrillation with slow ventricular response (HCC) 06/12/2008  . Hypercholesterolemia 06/10/2008  . MORBID OBESITY 06/10/2008  . CAD (coronary artery disease) 06/10/2008  . Chronic pulmonary heart disease (HCC) 06/10/2008  . RENAL FAILURE, CHRONIC 06/10/2008  . BENIGN PROSTATIC HYPERTROPHY, HX OF 06/10/2008  . OBESITY, NOS 04/13/2006  . Major depressive disorder, recurrent episode (HCC) 04/13/2006  . ANXIETY 04/13/2006  . Tobacco use disorder 04/13/2006  . MIGRAINE, UNSPEC., W/O INTRACTABLE MIGRAINE 04/13/2006  . HYPERTENSION, BENIGN SYSTEMIC 04/13/2006  . Chronic airway obstruction, not elsewhere classified 04/13/2006  . PEPTIC ULCER DIS., UNSPEC. W/O OBSTRUCTION 04/13/2006  . Hyperplasia of prostate 04/13/2006  . SEBORRHEIC DERMATITIS, NOS 04/13/2006  . OSTEOARTHRITIS, MULTI SITES 04/13/2006    Past Surgical History:  Procedure Laterality Date  . HERNIA REPAIR    . NM MYOCAR PERF WALL MOTION  11/25/2008   normal  . PERMANENT PACEMAKER INSERTION  11/16/2009   medtronic  . PROSTATE SURGERY          Home Medications    Prior to Admission medications   Medication Sig Start Date End Date Taking? Authorizing Provider  albuterol (PROVENTIL HFA;VENTOLIN HFA) 108 (90 BASE) MCG/ACT inhaler Inhale 2 puffs into the lungs every 6 (six) hours as needed for wheezing or shortness of breath.   Yes [provider]  ALPRAZolam Prudy Feeler) 1 MG tablet Take 0.5-1 mg by mouth every  6 (six) hours as needed for anxiety or sleep.    Yes [provider]  cyanocobalamin (,VITAMIN B-12,) 1000 MCG/ML injection Inject 1,000 mcg into the muscle every 30 (thirty) days.   Yes [provider]  fluticasone (FLONASE) 50 MCG/ACT nasal spray Place 2 sprays into both nostrils daily. Patient taking differently: Place 2 sprays into both  nostrils daily as needed for allergies (congestion).  05/21/17  Yes Albertine Grates, MD  furosemide (LASIX) 40 MG tablet Take 40 mg by mouth 2 (two) times daily.   Yes [provider]  guaiFENesin (MUCINEX) 600 MG 12 hr tablet Take 1 tablet (600 mg total) by mouth 2 (two) times daily. 05/20/17  Yes Albertine Grates, MD  isosorbide mononitrate (IMDUR) 30 MG 24 hr tablet Take 1 tablet (30 mg total) by mouth daily. 05/29/17 08/27/17 Yes Jodelle Gross, NP  magnesium hydroxide (MILK OF MAGNESIA) 400 MG/5ML suspension Take 30 mLs by mouth daily as needed for mild constipation. 05/20/17  Yes Albertine Grates, MD  omeprazole (PRILOSEC) 20 MG capsule Take 20 mg by mouth See admin instructions. Take one capsule by mouth every morning, may also take a 2nd capsule during the day if needed for acid reflux/indigestion   Yes [provider]  OVER THE COUNTER MEDICATION Place 1 drop into both eyes 2 (two) times daily as needed (dry eyes). Over the counter lubricating eye drop   Yes [provider]  oxyCODONE-acetaminophen (PERCOCET) 10-325 MG per tablet Take 1 tablet by mouth every 4 (four) hours as needed for pain.    Yes [provider]  OXYGEN Inhale 2.5 L into the lungs continuous.   Yes [provider]  potassium citrate (UROCIT-K) 10 MEQ (1080 MG) SR tablet Take 10 mEq by mouth daily.   Yes [provider]  Vitamin D, Ergocalciferol, (DRISDOL) 50000 UNITS CAPS Take 50,000 Units by mouth every Wednesday.    Yes [provider]  warfarin (COUMADIN) 5 MG tablet TAKE 1 TO 1 AND 1/2 TABLETS BY MOUTH DAILY AS DIRECTED BY COUMADIN CLINIC Patient taking differently: TAKE 1 1/2 TABLETS BY MOUTH ON MONDAY, WEDNESDAY, FRIDAY/ TAKE 1 TABLET ON SUNDAY, TUESDAY, THURSDAY, SATURDAY 12/05/16  Yes Croitoru, Mihai, MD  cephALEXin (KEFLEX) 500 MG capsule Take 1 capsule (500 mg total) by mouth 4 (four) times daily. 06/28/17   Vanetta Mulders, MD  lidocaine (LIDODERM) 5 % Place 2 patches onto  the skin daily. Remove & Discard patch within 12 hours or as directed by MD Patient not taking: Reported on 06/18/2017 05/20/17   Albertine Grates, MD  Misc. Devices (MATTRESS COVER) [facility] bed mattress  Patient request a firm mattress 05/20/17   Albertine Grates, MD  nitroGLYCERIN (NITROSTAT) 0.4 MG SL tablet Place 1 tablet (0.4 mg total) under the tongue every 5 (five) minutes as needed for chest pain. Patient not taking: Reported on 06/18/2017 08/24/16   Croitoru, Rachelle Hora, MD  potassium chloride SA (K-DUR,KLOR-CON) 20 MEQ tablet Take 1 tablet (20 mEq total) by mouth every Monday, Wednesday, and Friday. Patient not taking: Reported on 06/28/2017 05/22/17   Albertine Grates, MD    Family History Family History  Problem Relation Age of Onset  . Colon cancer Mother   . CVA Mother   . Heart attack Mother   . Heart failure Father   . Lung disease Father   . Lung cancer Brother   . Heart attack Brother     Social History Social History   Tobacco Use  . Smoking  status: Current Some Day Smoker    Packs/day: 1.00    Years: 65.00    Pack years: 65.00    Types: Cigarettes  . Smokeless tobacco: Never Used  Substance Use Topics  . Alcohol use: No    Alcohol/week: 0.0 oz  . Drug use: No     Allergies   Patient has no known allergies.   Review of Systems Review of Systems  Constitutional: Negative for fever.  HENT: Negative for congestion.   Eyes: Negative for redness.  Respiratory: Negative for shortness of breath.   Cardiovascular: Negative for chest pain.  Gastrointestinal: Positive for abdominal pain.  Genitourinary: Positive for difficulty urinating and hematuria.  Skin: Negative for rash.  Neurological: Negative for syncope.  Psychiatric/Behavioral: Negative for confusion.     Physical Exam Updated Vital Signs BP 118/61 (BP Location: Right Arm)   Pulse 60   Temp 97.6 F (36.4 C) (Oral)   Resp 17   SpO2 93%   Physical Exam  Constitutional: He is oriented to person, place, and time. He  appears well-developed and well-nourished. No distress.  HENT:  Head: Normocephalic and atraumatic.  Mouth/Throat: Oropharynx is clear and moist.  Eyes: Pupils are equal, round, and reactive to light. Conjunctivae and EOM are normal.  Neck: Neck supple.  Cardiovascular: Normal rate and regular rhythm.  Pulmonary/Chest: Effort normal and breath sounds normal.  Abdominal: Soft. Bowel sounds are normal. He exhibits distension.  Slightly distended suprapubic area  Genitourinary:  Genitourinary Comments: Suprapubic catheter exiting the abdomen on left lower quadrant area.  Large scar midline part of abdomen.  Catheter appeared a little kinked.  Also questionable right inguinal hernia.  Not incarcerated.  Musculoskeletal: Normal range of motion.  Neurological: He is alert and oriented to person, place, and time. No cranial nerve deficit or sensory deficit. He exhibits normal muscle tone. Coordination normal.  Skin: Skin is warm.  Nursing note and vitals reviewed.    ED Treatments / Results  Labs (all labs ordered are listed, but only abnormal results are displayed) Labs Reviewed  URINALYSIS, ROUTINE W REFLEX MICROSCOPIC - Abnormal; Notable for the following components:      Result Value   APPearance TURBID (*)    Hgb urine dipstick LARGE (*)    Protein, ur 100 (*)    Nitrite POSITIVE (*)    Leukocytes, UA LARGE (*)    RBC / HPF >50 (*)    WBC, UA >50 (*)    Bacteria, UA FEW (*)    All other components within normal limits  URINE CULTURE    EKG None  Radiology No results found.  Procedures Procedures (including critical care time)  Medications Ordered in ED Medications - No data to display   Initial Impression / Assessment and Plan / ED Course  I have reviewed the triage vital signs and the nursing notes.  Pertinent labs & imaging results that were available during my care of the patient were reviewed by me and considered in my medical decision making (see chart for  details).    Work-up here today catheter flowing well sounds as if it was obstructed at home.  Flushing well draining well.  Urine suggestive of urinary tract infection or may just be contamination since this is a suprapubic catheter.  Urine culture sent.  In the meantime was started on Keflex.  Recommend follow-up in the next several days with urology.  To sort out your catheter change out in a week or 2 and to follow-up  on this urine culture.  Patient nontoxic no acute distress.   Final Clinical Impressions(s) / ED Diagnoses   Final diagnoses:  Suprapubic catheter dysfunction, initial encounter [facility])    ED Discharge Orders        Ordered    cephALEXin (KEFLEX) 500 MG capsule  4 times daily     06/28/17 1442       Vanetta Mulders, MD 06/28/17 1449

## 2017-06-28 NOTE — Progress Notes (Deleted)
Cardiology Office Note   Date:  06/28/2017   ID:  Howard Combes., DOB 12-Sep-1935, MRN [id number]  PCP:  Aida Puffer, MD  Cardiologist: Dr. Royann Shivers  No chief complaint on file.    History of Present Illness: Howard Torres. is a 82 y.o. male who presents for ongoing assessment and management of atrial fibrillation on Coumadin, chronic diastolic heart failure, history of pulmonary hypertension, aortic valve stenosis, pacemaker in situ-MDT Adapta for bradycardia, chronic angina on nitrates, with other history to include obstructive sleep apnea and COPD.  Patient had recent hospitalization in April 2019 was found to have a right-sided pleural effusion treated conservatively with consideration for thoracentesis and the patient continued to be symptomatic.  The patient was followed by home health nurses.  The patient was seen last in the office in 05/29/2017 remain oxygen dependent and wheelchair bound.  He was asymptomatic.  The patient has not been really adherent to a low-sodium diet and weight had gone up about 6 pounds.  He was continued on Lasix 40 mg daily, restarted on isosorbide mononitrate as he had accidentally thrown it away.  He was continued on Coumadin therapy with good rate control without any AV nodal blocking agents.  He is followed by [facility] PT/INRs checked.  Unfortunately, the patient was seen in the emergency room on 06/18/2017, after having had a syncopal episode.  He apparently fell out of his wheelchair and felt like someone was shaking him.  The patient did not sustain any injuries to his head.  CT scan was negative for any acute abnormality.  The patient was returned home after full evaluation without any further treatment.  He was not found to be anemic,    Past Medical History:  Diagnosis Date  . Chest pain syndrome   . CHF (congestive heart failure) (HCC)   . COPD (chronic obstructive pulmonary disease) (HCC)   . Dyslipidemia   . GERD (gastroesophageal  reflux disease)   . OSA (obstructive sleep apnea)   . Pacemaker   . Permanent atrial fibrillation (HCC)   . Pulmonary hypertension (HCC)     Past Surgical History:  Procedure Laterality Date  . HERNIA REPAIR    . NM MYOCAR PERF WALL MOTION  11/25/2008   normal  . PERMANENT PACEMAKER INSERTION  11/16/2009   medtronic  . PROSTATE SURGERY       Current Outpatient Medications  Medication Sig Dispense Refill  . albuterol (PROVENTIL HFA;VENTOLIN HFA) 108 (90 BASE) MCG/ACT inhaler Inhale 2 puffs into the lungs every 6 (six) hours as needed for wheezing or shortness of breath.    . ALPRAZolam (XANAX) 1 MG tablet Take 0.5-1 mg by mouth every 6 (six) hours as needed for anxiety or sleep.     . cyanocobalamin (,VITAMIN B-12,) 1000 MCG/ML injection Inject 1,000 mcg into the muscle every 30 (thirty) days.    . fluticasone (FLONASE) 50 MCG/ACT nasal spray Place 2 sprays into both nostrils daily. (Patient taking differently: Place 2 sprays into both nostrils daily as needed for allergies (congestion). ) 16 g 0  . furosemide (LASIX) 40 MG tablet Take 40 mg by mouth 2 (two) times daily.    Marland Kitchen guaiFENesin (MUCINEX) 600 MG 12 hr tablet Take 1 tablet (600 mg total) by mouth 2 (two) times daily. (Patient not taking: Reported on 05/29/2017) 30 tablet 0  . isosorbide mononitrate (IMDUR) 30 MG 24 hr tablet Take 1 tablet (30 mg total) by mouth daily. 90 tablet 3  .  lidocaine (LIDODERM) 5 % Place 2 patches onto the skin daily. Remove & Discard patch within 12 hours or as directed by MD (Patient not taking: Reported on 06/18/2017) 30 patch 0  . magnesium hydroxide (MILK OF MAGNESIA) 400 MG/5ML suspension Take 30 mLs by mouth daily as needed for mild constipation. 118 mL 0  . Misc. Devices (MATTRESS COVER) [facility] bed mattress  Patient request a firm mattress 1 each 0  . nitroGLYCERIN (NITROSTAT) 0.4 MG SL tablet Place 1 tablet (0.4 mg total) under the tongue every 5 (five) minutes as needed for chest pain.  (Patient not taking: Reported on 06/18/2017) 25 tablet 3  . omeprazole (PRILOSEC) 20 MG capsule Take 20 mg by mouth See admin instructions. Take one capsule by mouth every morning, may also take a 2nd capsule during the day if needed for acid reflux/indigestion    . OVER THE COUNTER MEDICATION Place 1 drop into both eyes 2 (two) times daily as needed (dry eyes). Over the counter lubricating eye drop    . oxyCODONE-acetaminophen (PERCOCET) 10-325 MG per tablet Take 1 tablet by mouth every 4 (four) hours as needed for pain.     . OXYGEN Inhale 2.5 L into the lungs continuous.    . potassium chloride SA (K-DUR,KLOR-CON) 20 MEQ tablet Take 1 tablet (20 mEq total) by mouth every Monday, Wednesday, and Friday. (Patient taking differently: Take 20 mEq by mouth 2 (two) times daily. ) 10 tablet 0  . Vitamin D, Ergocalciferol, (DRISDOL) 50000 UNITS CAPS Take 50,000 Units by mouth every Wednesday.     . warfarin (COUMADIN) 5 MG tablet TAKE 1 TO 1 AND 1/2 TABLETS BY MOUTH DAILY AS DIRECTED BY COUMADIN CLINIC (Patient taking differently: TAKE 1 1/2 TABLETS BY MOUTH ON MONDAY, WEDNESDAY, FRIDAY/ TAKE 1 TABLET ON SUNDAY, TUESDAY, THURSDAY, SATURDAY) 45 tablet 9   No current facility-administered medications for this visit.     Allergies:   Patient has no known allergies.    Social History:  The patient  reports that he has been smoking cigarettes.  He has a 65.00 pack-year smoking history. He has never used smokeless tobacco. He reports that he does not drink alcohol or use drugs.   Family History:  The patient's family history includes CVA in his mother; Colon cancer in his mother; Heart attack in his brother and mother; Heart failure in his father; Lung cancer in his brother; Lung disease in his father.    ROS: All other systems are reviewed and negative. Unless otherwise mentioned in H&P    PHYSICAL EXAM: VS:  There were no vitals taken for this visit. , BMI There is no height or weight on file to  calculate BMI. GEN: Well nourished, well developed, in no acute distress  HEENT: normal  Neck: no JVD, carotid bruits, or masses Cardiac: ***RRR; no murmurs, rubs, or gallops,no edema  Respiratory:  clear to auscultation bilaterally, normal work of breathing GI: soft, nontender, nondistended, + BS MS: no deformity or atrophy  Skin: warm and dry, no rash Neuro:  Strength and sensation are intact Psych: euthymic mood, full affect   EKG:  EKG {ACTION; IS/IS [id number]} ordered today. The ekg ordered today demonstrates ***   Recent Labs: 05/18/2017: ALT 14 05/20/2017: Magnesium 1.8 05/29/2017: TSH 2.160 06/18/2017: B Natriuretic Peptide 167.7; BUN 23; Creatinine, Ser 1.00; Hemoglobin 11.5; Platelets 102; Potassium 4.5; Sodium 133    Lipid Panel    Component Value Date/Time   CHOL 102 05/29/2014 1121   TRIG  114 05/29/2014 1121   HDL 41 05/29/2014 1121   CHOLHDL 2.5 05/29/2014 1121   VLDL 23 05/29/2014 1121   LDLCALC 38 05/29/2014 1121      Wt Readings from Last 3 Encounters:  06/18/17 188 lb (85.3 kg)  05/29/17 186 lb 9.6 oz (84.6 kg)  05/20/17 181 lb 3.5 oz (82.2 kg)      Other studies Reviewed: Additional studies/ records that were reviewed today include: ***. Review of the above records demonstrates: ***   ASSESSMENT AND PLAN:  1.  ***   Current medicines are reviewed at length with the patient today.    Labs/ tests ordered today include: *** Bettey Mare. Howard Torres, ANP, AACC   06/28/2017 8:32 AM    [facility] [address] [place], [place] [address] Phone: [phone]; Fax: [phone]

## 2017-06-29 LAB — URINE CULTURE

## 2017-07-04 ENCOUNTER — Ambulatory Visit (INDEPENDENT_AMBULATORY_CARE_PROVIDER_SITE_OTHER): Payer: Medicare Other | Admitting: Pharmacist Clinician (PhC)/ Clinical Pharmacy Specialist

## 2017-07-04 DIAGNOSIS — I4891 Unspecified atrial fibrillation: Secondary | ICD-10-CM

## 2017-07-04 DIAGNOSIS — Z7901 Long term (current) use of anticoagulants: Secondary | ICD-10-CM

## 2017-07-04 LAB — POCT INR: INR: 1.8 — AB (ref 2.0–3.0)

## 2017-07-15 DEATH — deceased

## 2017-07-19 ENCOUNTER — Ambulatory Visit: Payer: Medicare Other | Admitting: Podiatry

## 2017-11-21 ENCOUNTER — Ambulatory Visit: Payer: Self-pay | Admitting: Pharmacist Clinician (PhC)/ Clinical Pharmacy Specialist

## 2017-11-21 DIAGNOSIS — Z7901 Long term (current) use of anticoagulants: Secondary | ICD-10-CM

## 2017-11-21 DIAGNOSIS — I4891 Unspecified atrial fibrillation: Secondary | ICD-10-CM

## 2019-06-14 IMAGING — DX DG CHEST 2V
2 series · 2 of 2 positions shown · non-contrast
Comparison: 08/16/2016

CLINICAL DATA: Shortness of breath, bilateral lower extremity
swelling

EXAM:
CHEST  2 VIEW

[chest pa]
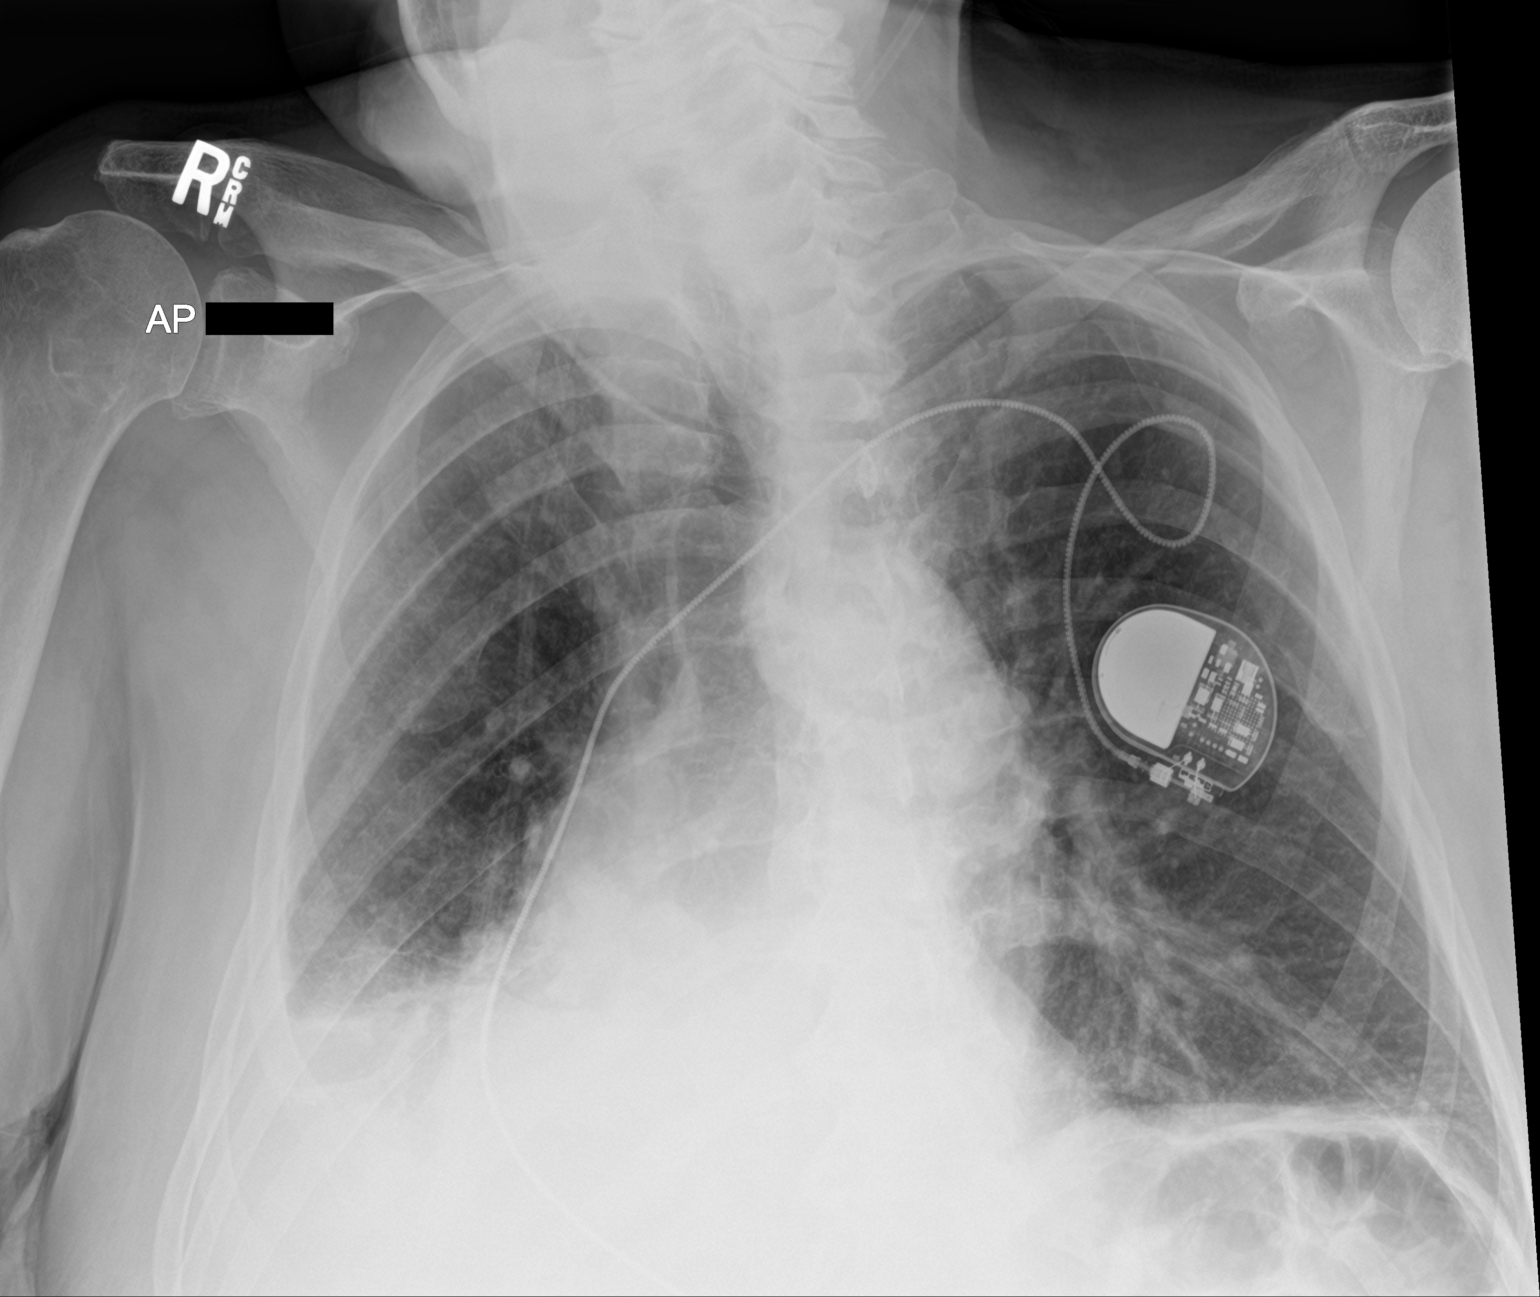

[chest lat]
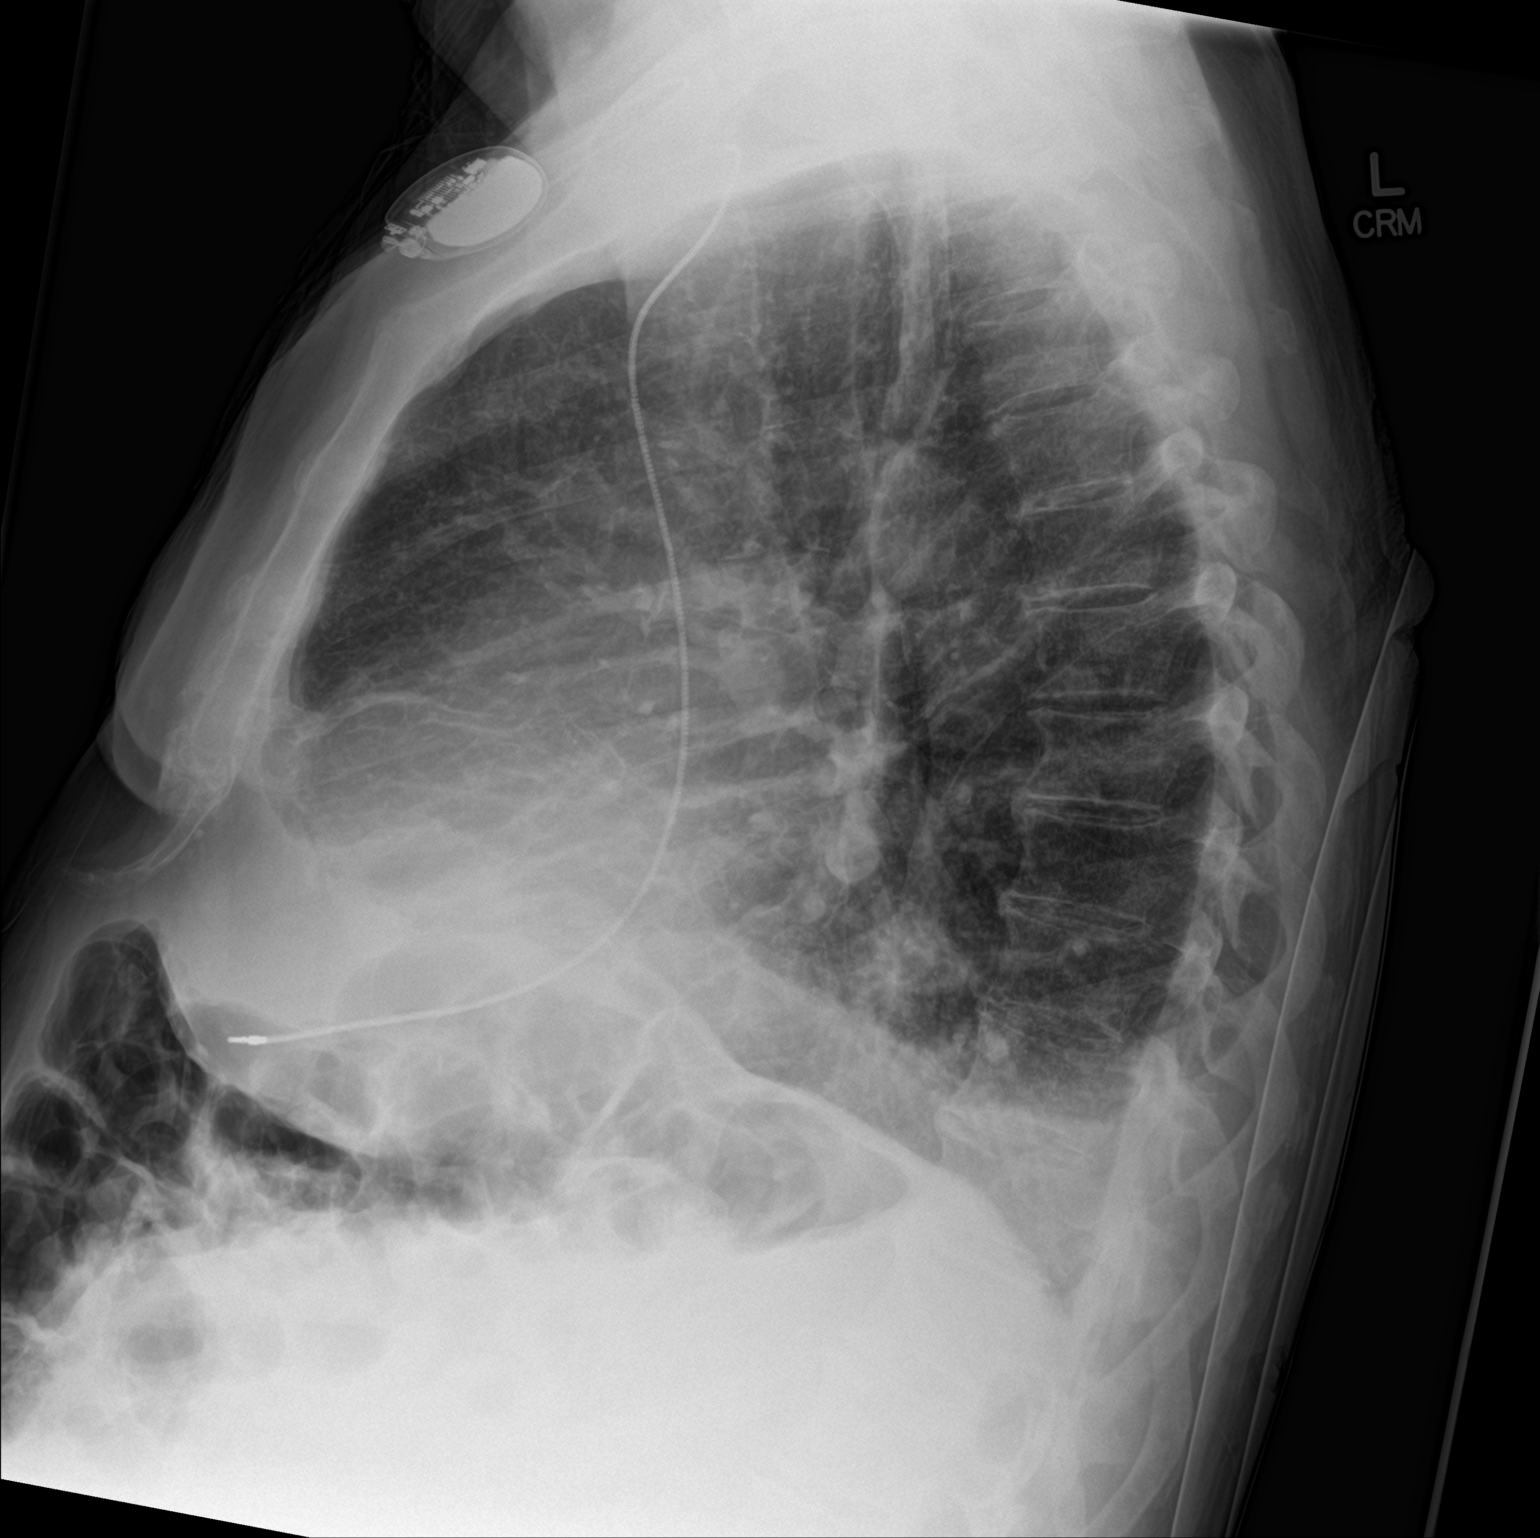

[2 of 2 positions shown; findings below may reference images not displayed]

FINDINGS: There is mild left basilar atelectasis. There is a small right
pleural effusion. There is no pneumothorax. The heart and
mediastinum are stable. There is a single lead cardiac pacemaker.

The osseous structures are unremarkable.
IMPRESSION: Small left pleural effusion.
